# Patient Record
Sex: Male | Born: 1940 | Hispanic: No | Marital: Married | Smoking: Never smoker
Health system: Southern US, Community
[De-identification: ages and names within clinical notes are randomized; demographics above are authoritative.]

## PROBLEM LIST (undated history)

## (undated) DIAGNOSIS — I10 Essential (primary) hypertension: Secondary | ICD-10-CM

## (undated) DIAGNOSIS — D649 Anemia, unspecified: Secondary | ICD-10-CM

## (undated) DIAGNOSIS — J302 Other seasonal allergic rhinitis: Secondary | ICD-10-CM

## (undated) DIAGNOSIS — E785 Hyperlipidemia, unspecified: Secondary | ICD-10-CM

## (undated) DIAGNOSIS — J45909 Unspecified asthma, uncomplicated: Secondary | ICD-10-CM

## (undated) DIAGNOSIS — I4891 Unspecified atrial fibrillation: Secondary | ICD-10-CM

## (undated) DIAGNOSIS — K219 Gastro-esophageal reflux disease without esophagitis: Secondary | ICD-10-CM

## (undated) DIAGNOSIS — E119 Type 2 diabetes mellitus without complications: Secondary | ICD-10-CM

## (undated) DIAGNOSIS — N189 Chronic kidney disease, unspecified: Secondary | ICD-10-CM

## (undated) DIAGNOSIS — Z8679 Personal history of other diseases of the circulatory system: Secondary | ICD-10-CM

## (undated) HISTORY — DX: Anemia, unspecified: D64.9

## (undated) HISTORY — DX: Unspecified atrial fibrillation: I48.91

## (undated) HISTORY — PX: NO PAST SURGERIES: SHX2092

## (undated) HISTORY — PX: OTHER SURGICAL HISTORY: SHX169

## (undated) NOTE — *Deleted (*Deleted)
PMR Admission Coordinator Pre-Admission Assessment   Patient: Todd [name] is an 26 y.o., male MRN: [id number] DOB: February 03, 1941 Height: 6\' 1"  (185.4 cm) Weight: 103.7 kg                                                                                                                                                  Insurance Information HMO:     PPO:      PCP:      IPA:      80/20:      OTHER:  PRIMARY: [employer]      Policy#: [id number]      Subscriber: pt CM Name: ***      Phone#: [phone] ext ***     Fax#: [phone] Pre-Cert#: ***      Employer:  Benefits:  Phone #: [phone]     Name:  Irene Shipper. Date: ***     Deduct: ***      Out of Pocket Max: ***      Life Max: n/a  CIR: ***      SNF: *** Outpatient: ***     Co-Pay: *** Home Health: ***      Co-Pay: *** DME: ***     Co-Pay: *** Providers: *** SECONDARY: [facility]      Policy#: [id number]      Phone#:    Financial Counselor:       Phone#:    The "Data Collection Information Summary" for patients in Inpatient Rehabilitation Facilities with attached "Privacy Act [place] Records" was provided and verbally reviewed with: Patient and Family   Emergency Contact Information         Contact Information     Name Relation Home Work Mobile    [place] [facility] Spouse [phone]       Current Medical History  Patient Admitting Diagnosis: L medullary infarct   History of Present Illness: 45 y.o. 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  After d/c to home he had issues with dizziness and falls due to hypotension and noted to have HR in 170-200 on evaluation by EMS.  History taken from chart review and patient.  He was cardioverted X 2 and found to have concerns of blown right pupil.  He was seen in ED and PERRL but had cogwheeling in R>L extremities and RLE weakness.   CT head unremarkable for acute intracranial process.   Patient was not a candidate for tPA. MRI/MRA brain done?  Subtle diffusion abnormality involving ventral left medulla question subtle infarct.    CT chest showed diffuse infiltrate consistent with extensive left-sided pneumonia.  He was started on IV antibiotics.  Cardiology, Dr. Einar Gip felt that elevated cardiac enzymes related to CV/Afib. Metabolic acidosis treated with IV fluids and Coreg titrated upwards for rate control.  Therapy evaluation revealed ataxic gait with slurred speech. CIR recommended due to functional decline.    Complete NIHSS TOTAL: 0 Glasgow Coma Scale Score: 15   Past Medical History      Past Medical History:  Diagnosis Date  . Anemia    . Asthma      history of asthma  . Atrial fibrillation ([place])    . Chronic kidney disease    . Diabetes mellitus without complication ([place])      type 2 - no meds  . GERD (gastroesophageal reflux disease)    . HLD (hyperlipidemia)    . Hypertension    . Seasonal allergies    . TIA (transient ischemic attack) 06/15/2017      Family History  family history includes Colon cancer in his father; Diabetes in his father, mother, and sister; Stroke in his father.   Prior Rehab/Hospitalizations:  Has the patient had prior rehab or hospitalizations prior to admission? No   Has the patient had major surgery during 100 days prior to admission? Yes   Current Medications    Current Facility-Administered Medications:  .   stroke: mapping our early stages of recovery book, , Does not apply, Once, Posey Pronto, Poonam, MD .  albuterol (VENTOLIN HFA) 108 (90 Base) MCG/ACT inhaler 2 puff, 2 puff, Inhalation, Q6H PRN, Posey Pronto, Poonam, MD .  amoxicillin-clavulanate (AUGMENTIN) 500-125 MG per tablet 500 mg, 1 tablet, Oral, Q12H, Hensel, Jamal Collin, MD, 500 mg at 01/24/20 0911 .  apixaban (ELIQUIS) tablet 2.5 mg, 2.5 mg, Oral, BID, Dickie La, MD, 2.5 mg at 01/24/20 0858 .  atorvastatin (LIPITOR) tablet 80 mg, 80 mg, Oral, q1800, Lattie Haw, MD, 80 mg  at 01/23/20 1729 .  carvedilol (COREG) tablet 12.5 mg, 12.5 mg, Oral, BID WC, Ganta, Anupa, DO, 12.5 mg at 01/24/20 0858 .  ferrous sulfate tablet 325 mg, 325 mg, Oral, Daily, Lattie Haw, MD, 325 mg at 01/24/20 0858 .  fluticasone (FLONASE) 50 MCG/ACT nasal spray 2 spray, 2 spray, Each Nare, Daily PRN, Posey Pronto, Poonam, MD .  pantoprazole (PROTONIX) EC tablet 40 mg, 40 mg, Oral, Daily, Lattie Haw, MD, 40 mg at 01/24/20 0859 .  polyethylene glycol (MIRALAX / GLYCOLAX) packet 17 g, 17 g, Oral, Daily, Benay Pike, MD, 17 g at 01/24/20 1504 .  senna (SENOKOT) tablet 8.6 mg, 1 tablet, Oral, Daily, Benay Pike, MD, 8.6 mg at 01/24/20 1503   Patients Current Diet:     Diet Order                      Diet Heart Room service appropriate? Yes; Fluid consistency: Thin  Diet effective now                      Precautions / Restrictions Precautions Precautions: Fall Restrictions Weight Bearing Restrictions: No    Has the patient had 2 or more falls or a fall with injury in the past year?No   Prior Activity Level Limited Community (1-2x/wk): independent prior to admission, no DME except occasional cane outside, not driving   Prior Functional Level Prior Function Level of Independence: Independent, Independent with assistive device(s) Comments: pt reports use of cane intermittently when outdoors   Self Care: Did the patient need help bathing, dressing, using the toilet or eating?  Independent   Indoor Mobility: Did the patient need assistance with walking from room to room (with or without device)? Independent   Stairs: Did the patient need  assistance with internal or external stairs (with or without device)? Independent   Functional Cognition: Did the patient need help planning regular tasks such as shopping or remembering to take medications? Independent   Home Assistive Devices / Equipment Home Equipment: Environmental consultant - 2 wheels, [place] - single point, Grab bars - toilet, Grab  bars - tub/shower   Prior Device Use: Indicate devices/aids used by the patient prior to current illness, exacerbation or injury? None of the above   Current Functional Level Cognition   Arousal/Alertness: Awake/alert Overall Cognitive Status: No family/caregiver present to determine baseline cognitive functioning Orientation Level: Oriented to person, Oriented to place, Oriented to situation General Comments: slow processing and delayed response time Attention: Focused, Sustained Focused Attention: Appears intact Sustained Attention: Appears intact Memory: Impaired Memory Impairment: Retrieval deficit, Decreased recall of new information (Immediate: 3/3; delayed: 2/3; with cues: 1/1) Awareness: Appears intact Problem Solving: Appears intact Executive Function: Reasoning Reasoning: Impaired Reasoning Impairment: Verbal complex    Extremity Assessment (includes Sensation/Coordination)   Upper Extremity Assessment: RUE deficits/detail, LUE deficits/detail RUE Deficits / Details: AROM shoulder level to 90*, gross strength 4/5 but UE muscle groups a bit weaker when tested in isolation. Pt reports sensation intact. Decreased fine motor. Weak grip stength but able to hold onto rw. RUE Sensation: WNL RUE Coordination: decreased fine motor, decreased gross motor LUE Deficits / Details: generalized weakness.  LUE Sensation: WNL  Lower Extremity Assessment: Defer to PT evaluation     ADLs   Overall ADL's : Needs assistance/impaired Eating/Feeding: Set up, Sitting Grooming: Minimal assistance, Sitting Upper Body Bathing: Minimal assistance, Sitting Lower Body Bathing: Moderate assistance, Sit to/from stand Upper Body Dressing : Minimal assistance, Sitting Lower Body Dressing: Moderate assistance, Sit to/from stand Toilet Transfer: Minimal assistance, Ambulation, RW Toileting- Clothing Manipulation and Hygiene: Moderate assistance, Sit to/from stand Tub/ Shower Transfer: Tub transfer,  Moderate assistance, Ambulation, Rolling walker, 3 in 1 Functional mobility during ADLs: Minimal assistance, Rolling walker, Min guard General ADL Comments: Assist to powerup to standing and steady descent into sitting. Mostly min guard for functional mobility but light min A to steady on turns.      Mobility   Overal bed mobility: Needs Assistance Bed Mobility: Supine to Sit Supine to sit: Supervision, HOB elevated General bed mobility comments: extra time and effort. pt utilized bed rail and a bit of momentum     Transfers   Overall transfer level: Needs assistance Equipment used: Rolling walker (2 wheeled) Transfers: Sit to/from Stand Sit to Stand: Mod assist General transfer comment: PT providing cues for hand and foot placement as well as technique. Pt continues to require significant assistance to power up to standing due to LE weakness     Ambulation / Gait / Stairs / Wheelchair Mobility   Ambulation/Gait Ambulation/Gait assistance: Counsellor (Feet): 80 Feet Assistive device: Rolling walker (2 wheeled) Gait Pattern/deviations: Step-to pattern General Gait Details: pt with short step to gait, improved control of stance width without instances of scissoring. Pt does have poor R hand control and requires cues to maintain R hand on RW Gait velocity: reduced Gait velocity interpretation: <1.8 ft/sec, indicate of risk for recurrent falls     Posture / Balance Dynamic Sitting Balance Sitting balance - Comments: supervision Balance Overall balance assessment: Needs assistance Sitting-balance support: No upper extremity supported, Feet supported Sitting balance-Leahy Scale: Good Sitting balance - Comments: supervision Standing balance support: Single extremity supported, Bilateral upper extremity supported Standing balance-Leahy Scale: Poor Standing balance comment:  reliant on UE support of RW and minG     Special needs/care consideration Diabetic management yes and  Designated visitor [place] (from acute therapy documentation) Living Arrangements: Spouse/significant other  Lives With: Spouse Available Help at Discharge: Family, Available 24 hours/day Type of Home: House Home Layout: Two level Alternate Level Stairs-Rails: Right, Left (split rail, half left and half right) Alternate Level Stairs-Number of Steps: 13 Home Access: Stairs to enter Entrance Stairs-Rails: Can reach both Entrance Stairs-Number of Steps: 2 Bathroom Shower/Tub: Chiropodist: Standard   Discharge Living Setting Plans for Discharge Living Setting: Patient's home Type of Home at Discharge: House Discharge Home Layout: Two level, Bed/bath upstairs Alternate Level Stairs-Rails: Left (1/2 rail on R) Alternate Level Stairs-Number of Steps: 13 Discharge Home Access: Stairs to enter Entrance Stairs-Rails: Left, Right Entrance Stairs-Number of Steps: 2 Discharge Bathroom Shower/Tub: Tub/shower unit Discharge Bathroom Toilet: Standard Discharge Bathroom Accessibility: Yes How Accessible: Accessible via walker Does the patient have any problems obtaining your medications?: No   Social/Family/Support Systems Patient Roles: Spouse Anticipated Caregiver: Demeko Trabue (spouse) Anticipated Caregiver's Contact Information: [phone]; [phone] Ability/Limitations of Caregiver: supervision only Caregiver Availability: 24/7 Discharge Plan Discussed with Primary Caregiver: Yes Is Caregiver In Agreement with Plan?: Yes Does Caregiver/Family have Issues with Lodging/Transportation while Pt is in Rehab?: No     Goals Patient/Family Goal for Rehab: PT/OT supervision to mod I, SLP n/a Expected length of stay: 9-12 days Additional Information: pt also has VA benefits Pt/Family Agrees to Admission and willing to participate: Yes Program Orientation Provided & Reviewed with Pt/Caregiver Including Roles  &  Responsibilities: Yes Additional Information Needs: no     Decrease burden of Care through IP rehab admission: n/a     Possible need for SNF placement upon discharge:Not anticipated     Patient Condition: I have reviewed medical records from [facility], spoken with CM, and patient and spouse. I met with patient at the bedside for inpatient rehabilitation assessment.  Patient will benefit from ongoing PT, OT and SLP, can actively participate in 3 hours of therapy a day 5 days of the week, and can make measurable gains during the admission.  Patient will also benefit from the coordinated team approach during an Inpatient Acute Rehabilitation admission.  The patient will receive intensive therapy as well as Rehabilitation physician, nursing, social worker, and care management interventions.  Due to bladder management, bowel management, safety, skin/wound care, disease management, medication administration, pain management and patient education the patient requires 24 hour a day rehabilitation nursing.  The patient is currently min to mod assist with mobility and basic ADLs.  Discharge setting and therapy post discharge at home with home health is anticipated.  Patient has agreed to participate in the Acute Inpatient Rehabilitation Program and will admit pending insurance authorization ***.   Preadmission Screen Completed By:  Michel Santee, PT, DPT 01/24/2020 4:24 PM

---

## 2013-10-04 ENCOUNTER — Emergency Department (INDEPENDENT_AMBULATORY_CARE_PROVIDER_SITE_OTHER)
Admission: EM | Admit: 2013-10-04 | Discharge: 2013-10-04 | Disposition: A | Discharge status: Home / Self Care | Payer: Medicare Other | Source: Home / Self Care | Attending: Family Medicine | Admitting: Family Medicine

## 2013-10-04 ENCOUNTER — Encounter (HOSPITAL_COMMUNITY): Payer: Self-pay | Admitting: Emergency Medicine

## 2013-10-04 DIAGNOSIS — S61209A Unspecified open wound of unspecified finger without damage to nail, initial encounter: Secondary | ICD-10-CM | POA: Diagnosis not present

## 2013-10-04 DIAGNOSIS — W230XXA Caught, crushed, jammed, or pinched between moving objects, initial encounter: Secondary | ICD-10-CM

## 2013-10-04 DIAGNOSIS — S61213A Laceration without foreign body of left middle finger without damage to nail, initial encounter: Secondary | ICD-10-CM

## 2013-10-04 DIAGNOSIS — Z23 Encounter for immunization: Secondary | ICD-10-CM

## 2013-10-04 MED ORDER — TETANUS-DIPHTH-ACELL PERTUSSIS 5-2.5-18.5 LF-MCG/0.5 IM SUSP
0.5000 mL | Freq: Once | INTRAMUSCULAR | Status: AC
  Administered 2013-10-04: 0.5 mL via INTRAMUSCULAR

## 2013-10-04 MED ORDER — TETANUS-DIPHTH-ACELL PERTUSSIS 5-2.5-18.5 LF-MCG/0.5 IM SUSP
INTRAMUSCULAR | Status: AC
  Filled 2013-10-04: qty 0.5

## 2013-10-04 NOTE — ED Provider Notes (Signed)
Todd [name] is a 73 y.o. male who presents to Urgent Care today for laceration. Patient suffered a laceration to the left third digit at the DIP joint. The laceration is on the volar aspect. Laceration occurred when his finger was caught in a sliding door. He denies any weakness or numbness.. The wound at home. He feels well otherwise. He cannot recall his last tetanus vaccination.   History reviewed. No pertinent past medical history. History  Substance Use Topics  . Smoking status: Not on file  . Smokeless tobacco: Not on file  . Alcohol Use: Not on file   ROS as above Medications: No current facility-administered medications for this encounter.   No current outpatient prescriptions on file.    Exam:  BP 154/78  Pulse 61  Temp(Src) 98.1 F (36.7 C) (Oral)  Resp 18  SpO2 97% Gen: Well NAD ] Left third digit: 1-1/2 cm laceration at the PIP volar aspect. The laceration just for the dermis but not to deep structures. Patient is intact capillary refill sensation and flexion and extension. Pulses are intact at the wrist. No deformity noted.  Laceration repair:  Consent obtained and timeout performed. 4 mL of lidocaine without epinephrine were used to achieve a digital nerve block. 1 mL of lidocaine without epinephrine was injected around the wound achieving great anesthesia. The wound was copiously irrigated with sure cleanse solution, The wound was prepped and draped in the usual sterile fashion using Betadine. 3 horizontal mattress sutures using 4-0 Ethilon was used to close the wound. Patient tolerated the procedure well.  No results found for this or any previous visit (from the past 24 hour(s)). No results found.  Assessment and Plan: 73 y.o. male with laceration to left third digit. Repaired with sutures Tetanus vaccination provided. Followup in one week for suture removal.  Discussed warning signs or symptoms. Please see discharge instructions. Patient expresses  understanding.    Gregor Hams, MD 10/04/13 [phone]

## 2013-10-04 NOTE — Discharge Instructions (Signed)
Thank you for coming in today. Return in 1 week for suture removal.   Laceration Care, Adult A laceration is a cut or lesion that goes through all layers of the skin and into the tissue just beneath the skin. TREATMENT  Some lacerations may not require closure. Some lacerations may not be able to be closed due to an increased risk of infection. It is important to see your caregiver as soon as possible after an injury to minimize the risk of infection and maximize the opportunity for successful closure. If closure is appropriate, pain medicines may be given, if needed. The wound will be cleaned to help prevent infection. Your caregiver will use stitches (sutures), staples, wound glue (adhesive), or skin adhesive strips to repair the laceration. These tools bring the skin edges together to allow for faster healing and a better cosmetic outcome. However, all wounds will heal with a scar. Once the wound has healed, scarring can be minimized by covering the wound with sunscreen during the day for 1 full year. HOME CARE INSTRUCTIONS  For sutures or staples:  Keep the wound clean and dry.  If you were given a bandage (dressing), you should change it at least once a day. Also, change the dressing if it becomes wet or dirty, or as directed by your caregiver.  Wash the wound with soap and water 2 times a day. Rinse the wound off with water to remove all soap. Pat the wound dry with a clean towel.  After cleaning, apply a thin layer of the antibiotic ointment as recommended by your caregiver. This will help prevent infection and keep the dressing from sticking.  You may shower as usual after the first 24 hours. Do not soak the wound in water until the sutures are removed.  Only take over-the-counter or prescription medicines for pain, discomfort, or fever as directed by your caregiver.  Get your sutures or staples removed as directed by your caregiver. For skin adhesive strips:  Keep the wound clean  and dry.  Do not get the skin adhesive strips wet. You may bathe carefully, using caution to keep the wound dry.  If the wound gets wet, pat it dry with a clean towel.  Skin adhesive strips will fall off on their own. You may trim the strips as the wound heals. Do not remove skin adhesive strips that are still stuck to the wound. They will fall off in time. For wound adhesive:  You may briefly wet your wound in the shower or bath. Do not soak or scrub the wound. Do not swim. Avoid periods of heavy perspiration until the skin adhesive has fallen off on its own. After showering or bathing, gently pat the wound dry with a clean towel.  Do not apply liquid medicine, cream medicine, or ointment medicine to your wound while the skin adhesive is in place. This may loosen the film before your wound is healed.  If a dressing is placed over the wound, be careful not to apply tape directly over the skin adhesive. This may cause the adhesive to be pulled off before the wound is healed.  Avoid prolonged exposure to sunlight or tanning lamps while the skin adhesive is in place. Exposure to ultraviolet light in the first year will darken the scar.  The skin adhesive will usually remain in place for 5 to 10 days, then naturally fall off the skin. Do not pick at the adhesive film. You may need a tetanus shot if:  You cannot  remember when you had your last tetanus shot.  You have never had a tetanus shot. If you get a tetanus shot, your arm may swell, get red, and feel warm to the touch. This is common and not a problem. If you need a tetanus shot and you choose not to have one, there is a rare chance of getting tetanus. Sickness from tetanus can be serious. SEEK MEDICAL CARE IF:   You have redness, swelling, or increasing pain in the wound.  You see a red line that goes away from the wound.  You have yellowish-white fluid (pus) coming from the wound.  You have a fever.  You notice a bad smell coming  from the wound or dressing.  Your wound breaks open before or after sutures have been removed.  You notice something coming out of the wound such as wood or glass.  Your wound is on your hand or foot and you cannot move a finger or toe. SEEK IMMEDIATE MEDICAL CARE IF:   Your pain is not controlled with prescribed medicine.  You have severe swelling around the wound causing pain and numbness or a change in color in your arm, hand, leg, or foot.  Your wound splits open and starts bleeding.  You have worsening numbness, weakness, or loss of function of any joint around or beyond the wound.  You develop painful lumps near the wound or on the skin anywhere on your body. MAKE SURE YOU:   Understand these instructions.  Will watch your condition.  Will get help right away if you are not doing well or get worse. Document Released: 03/22/2005 Document Revised: 06/14/2011 Document Reviewed: 09/15/2010 [facility] Patient Information 2015 [place], [place]. This information is not intended to replace advice given to you by your health care provider. Make sure you discuss any questions you have with your health care provider.

## 2013-10-04 NOTE — ED Notes (Signed)
C/o left middle finger laceration due to closing hand in sliding door at home an hour ago

## 2013-10-18 ENCOUNTER — Encounter (HOSPITAL_COMMUNITY): Payer: Self-pay | Admitting: Emergency Medicine

## 2013-10-18 ENCOUNTER — Emergency Department (INDEPENDENT_AMBULATORY_CARE_PROVIDER_SITE_OTHER)
Admission: EM | Admit: 2013-10-18 | Discharge: 2013-10-18 | Disposition: A | Discharge status: Home / Self Care | Payer: Medicare Other | Source: Home / Self Care | Attending: Family Medicine | Admitting: Family Medicine

## 2013-10-18 DIAGNOSIS — Z4802 Encounter for removal of sutures: Secondary | ICD-10-CM | POA: Diagnosis not present

## 2013-10-18 DIAGNOSIS — S61219D Laceration without foreign body of unspecified finger without damage to nail, subsequent encounter: Secondary | ICD-10-CM

## 2013-10-18 NOTE — ED Provider Notes (Signed)
Todd [name] is a 73 y.o. male who presents to Urgent Care today for a lump. Patient was seen in July 2 for laceration of the volar left third DIP. He is here today for suture removal. He feels well with no pain or weakness or numbness.   No past medical history on file. History  Substance Use Topics  . Smoking status: Not on file  . Smokeless tobacco: Not on file  . Alcohol Use: Not on file   ROS as above Medications: No current facility-administered medications for this encounter.   No current outpatient prescriptions on file.    Exam:  BP 147/74  Pulse 75  Temp(Src) 98.9 F (37.2 C) (Oral)  Resp 16  SpO2 100% Gen: Well NAD Left third digit: Well-appearing wound and scar formation at the volar left third digit DIP. Flexion strength sensation and capillary refill are intact. Range of motion is slightly limited in flexion.  3 horizontal mattress sutures were removed.   No results found for this or any previous visit (from the past 24 hour(s)). No results found.  Assessment and Plan: 72 y.o. male with followup left third digit laceration with suture removal. Patient is doing well. Recommend home hand exercises. Additionally discussed scar minimization. If not improved followup with PCP for referral to hand physical therapy.  Discussed warning signs or symptoms. Please see discharge instructions. Patient expresses understanding.   This note was created using Systems analyst. Any transcription errors are unintended.    Gregor Hams, MD 10/18/13 1254

## 2013-10-18 NOTE — Discharge Instructions (Signed)
Thank you for coming in today. Work on the range of motion of that last joint and work on strength.  If not improving follow up with hand therapy at the [place].    Scar Minimization You will have a scar anytime you have surgery and a cut is made in the skin or you have something removed from your skin (mole, skin cancer, cyst). Although scars are unavoidable following surgery, there are ways to minimize their appearance. It is important to follow all the instructions you receive from your caregiver about wound care. How your wound heals will influence the appearance of your scar. If you do not follow the wound care instructions as directed, complications such as infection may occur. Wound instructions include keeping the wound clean, moist, and not letting the wound form a scab. Some people form scars that are raised and lumpy (hypertrophic) or larger than the initial wound (keloidal). HOME CARE INSTRUCTIONS   Follow wound care instructions as directed.  Keep the wound clean by washing it with soap and water.  Keep the wound moist with provided antibiotic cream or petroleum jelly until completely healed. Moisten twice a day for about 2 weeks.  Get stitches (sutures) taken out at the scheduled time.  Avoid touching or manipulating your wound unless needed. Wash your hands thoroughly before and after touching your wound.  Follow all restrictions such as limits on exercise or work. This depends on where your scar is located.  Keep the scar protected from sunburn. Cover the scar with sunscreen/sunblock with SPF 30 or higher.  Gently massage the scar using a circular motion to help minimize the appearance of the scar. Do this only after the wound has closed and all the sutures have been removed.  For hypertrophic or keloidal scars, there are several ways to treat and minimize their appearance. Methods include compression therapy, intralesional corticosteroids, laser therapy, or surgery. These  methods are performed by your caregiver. Remember that the scar may appear lighter or darker than your normal skin color. This difference in color should even out with time. SEEK MEDICAL CARE IF:   You have a fever.  You develop signs of infection such as pain, redness, pus, and warmth.  You have questions or concerns. Document Released: 09/09/2009 Document Revised: 06/14/2011 Document Reviewed: 09/09/2009 [facility] Patient Information 2015 [place], [place]. This information is not intended to replace advice given to you by your health care provider. Make sure you discuss any questions you have with your health care provider.

## 2013-10-18 NOTE — ED Notes (Signed)
Here suture removal

## 2014-10-05 ENCOUNTER — Emergency Department (HOSPITAL_COMMUNITY): Payer: Medicare Other

## 2014-10-05 ENCOUNTER — Encounter (HOSPITAL_COMMUNITY): Payer: Self-pay

## 2014-10-05 ENCOUNTER — Emergency Department (HOSPITAL_COMMUNITY)
Admission: EM | Admit: 2014-10-05 | Discharge: 2014-10-05 | Disposition: A | Discharge status: Home / Self Care | Payer: Medicare Other | Attending: Emergency Medicine | Admitting: Emergency Medicine

## 2014-10-05 DIAGNOSIS — Z79899 Other long term (current) drug therapy: Secondary | ICD-10-CM | POA: Diagnosis not present

## 2014-10-05 DIAGNOSIS — Z794 Long term (current) use of insulin: Secondary | ICD-10-CM | POA: Diagnosis not present

## 2014-10-05 DIAGNOSIS — Z7982 Long term (current) use of aspirin: Secondary | ICD-10-CM | POA: Insufficient documentation

## 2014-10-05 DIAGNOSIS — R102 Pelvic and perineal pain: Secondary | ICD-10-CM | POA: Diagnosis not present

## 2014-10-05 DIAGNOSIS — R1032 Left lower quadrant pain: Secondary | ICD-10-CM | POA: Diagnosis present

## 2014-10-05 DIAGNOSIS — R109 Unspecified abdominal pain: Secondary | ICD-10-CM | POA: Diagnosis not present

## 2014-10-05 LAB — CBC WITH DIFFERENTIAL/PLATELET
BASOS ABS: 0 10*3/uL (ref 0.0–0.1)
BASOS PCT: 0 % (ref 0–1)
Eosinophils Absolute: 0.2 10*3/uL (ref 0.0–0.7)
Eosinophils Relative: 3 % (ref 0–5)
HEMATOCRIT: 38.2 % — AB (ref 39.0–52.0)
HEMOGLOBIN: 12.5 g/dL — AB (ref 13.0–17.0)
Lymphocytes Relative: 34 % (ref 12–46)
Lymphs Abs: 2 10*3/uL (ref 0.7–4.0)
MCH: 26.7 pg (ref 26.0–34.0)
MCHC: 32.7 g/dL (ref 30.0–36.0)
MCV: 81.6 fL (ref 78.0–100.0)
Monocytes Absolute: 0.5 10*3/uL (ref 0.1–1.0)
Monocytes Relative: 8 % (ref 3–12)
Neutro Abs: 3.2 10*3/uL (ref 1.7–7.7)
Neutrophils Relative %: 55 % (ref 43–77)
PLATELETS: 236 10*3/uL (ref 150–400)
RBC: 4.68 MIL/uL (ref 4.22–5.81)
RDW: 12.8 % (ref 11.5–15.5)
WBC: 5.8 10*3/uL (ref 4.0–10.5)

## 2014-10-05 LAB — COMPREHENSIVE METABOLIC PANEL
ALBUMIN: 4.1 g/dL (ref 3.5–5.0)
ALK PHOS: 49 U/L (ref 38–126)
ALT: 18 U/L (ref 17–63)
AST: 20 U/L (ref 15–41)
Anion gap: 11 (ref 5–15)
BILIRUBIN TOTAL: 0.5 mg/dL (ref 0.3–1.2)
BUN: 32 mg/dL — AB (ref 6–20)
CALCIUM: 8.9 mg/dL (ref 8.9–10.3)
CHLORIDE: 104 mmol/L (ref 101–111)
CO2: 26 mmol/L (ref 22–32)
CREATININE: 1.78 mg/dL — AB (ref 0.61–1.24)
GFR, EST AFRICAN AMERICAN: 41 mL/min — AB (ref >60)
GFR, EST NON AFRICAN AMERICAN: 35 mL/min — AB (ref >60)
Glucose, Bld: 183 mg/dL — ABNORMAL HIGH (ref 65–99)
POTASSIUM: 3.3 mmol/L — AB (ref 3.5–5.1)
SODIUM: 141 mmol/L (ref 135–145)
TOTAL PROTEIN: 7.5 g/dL (ref 6.5–8.1)

## 2014-10-05 LAB — LIPASE, BLOOD: Lipase: 53 U/L — ABNORMAL HIGH (ref 22–51)

## 2014-10-05 LAB — URINALYSIS, ROUTINE W REFLEX MICROSCOPIC
Bilirubin Urine: NEGATIVE
Glucose, UA: NEGATIVE mg/dL
HGB URINE DIPSTICK: NEGATIVE
Ketones, ur: NEGATIVE mg/dL
LEUKOCYTES UA: NEGATIVE
Nitrite: NEGATIVE
Protein, ur: NEGATIVE mg/dL
Specific Gravity, Urine: 1.01 (ref 1.005–1.030)
UROBILINOGEN UA: 0.2 mg/dL (ref 0.0–1.0)
pH: 5 (ref 5.0–8.0)

## 2014-10-05 MED ORDER — ONDANSETRON HCL 4 MG PO TABS: 4.0000 mg | ORAL_TABLET | Freq: Four times a day (QID) | ORAL | Status: DC

## 2014-10-05 NOTE — ED Notes (Signed)
US at bedside

## 2014-10-05 NOTE — ED Notes (Signed)
Pt being interviewed by EDP at present: Pt c/o abdominal pain x 2 months that comes and goes.  C/O constipation and diarrhea and pain worsened today.  Denies n/v, fevers.

## 2014-10-05 NOTE — Discharge Instructions (Signed)
The cause of your abdominal pain was not identified today.  Please follow up with your family doctor to review [facility].  You have kidney disease with a creatinine of 1.78 today, which is similar to your prior labs.  Get rechecked immediately if you develop any new or worrisome symptoms.    Abdominal Pain Many things can cause abdominal pain. Usually, abdominal pain is not caused by a disease and will improve without treatment. It can often be observed and treated at home. Your health care provider will do a physical exam and possibly order blood tests and X-rays to help determine the seriousness of your pain. However, in many cases, more time must pass before a clear cause of the pain can be found. Before that point, your health care provider may not know if you need more testing or further treatment. HOME CARE INSTRUCTIONS  Monitor your abdominal pain for any changes. The following actions may help to alleviate any discomfort you are experiencing:  Only take over-the-counter or prescription medicines as directed by your health care provider.  Do not take laxatives unless directed to do so by your health care provider.  Try a clear liquid diet (broth, tea, or water) as directed by your health care provider. Slowly move to a bland diet as tolerated. SEEK MEDICAL CARE IF:  You have unexplained abdominal pain.  You have abdominal pain associated with nausea or diarrhea.  You have pain when you urinate or have a bowel movement.  You experience abdominal pain that wakes you in the night.  You have abdominal pain that is worsened or improved by eating food.  You have abdominal pain that is worsened with eating fatty foods.  You have a fever. SEEK IMMEDIATE MEDICAL CARE IF:   Your pain does not go away within 2 hours.  You keep throwing up (vomiting).  Your pain is felt only in portions of the abdomen, such as the right side or the left lower portion of the abdomen.  You  pass bloody or black tarry stools. MAKE SURE YOU:  Understand these instructions.   Will watch your condition.   Will get help right away if you are not doing well or get worse.  Document Released: 12/30/2004 Document Revised: 03/27/2013 Document Reviewed: 11/29/2012 [facility] Patient Information 2015 [place], [place]. This information is not intended to replace advice given to you by your health care provider. Make sure you discuss any questions you have with your health care provider.

## 2014-10-05 NOTE — ED Provider Notes (Signed)
CSN: [id number]     Arrival date & time 10/05/14  1426 History   First MD Initiated Contact with Patient 10/05/14 1457     Chief Complaint  Patient presents with  . Abdominal Pain     Patient is a 74 y.o. male presenting with abdominal pain. The history is provided by the patient and the spouse. No language interpreter was used.  Abdominal Pain  Todd [name] is for evaluation of abdominal pain. He reports 2 months of waxing and waning abdominal pain. The pain is located in the central abdominal region and left lower quadrant. Pain is worse with medications and meals. It is waxing and waning in nature. He has intermittent constipation and diarrhea, currently his bowel movements are regular. He presents today because the pain was much worse earlier prior to ED arrival. Denies any fevers, vomiting. He recently had an outpatient CT scan at the [place] 2 days ago for evaluation of this abdominal pain after receiving a letter saying that he may have pancreatitis.  No past medical history on file. No past surgical history on file. No family history on file. History  Substance Use Topics  . Smoking status: Not on file  . Smokeless tobacco: Not on file  . Alcohol Use: Not on file    Review of Systems  Gastrointestinal: Positive for abdominal pain.  All other systems reviewed and are negative.     Allergies  Lisinopril and Metformin and related  Home Medications   Prior to Admission medications   Medication Sig Start Date End Date Taking? Authorizing Provider  amLODipine (NORVASC) 10 MG tablet Take 10 mg by mouth daily.   Yes Historical Provider, MD  aspirin 325 MG tablet Take 325 mg by mouth daily.   Yes Historical Provider, MD  furosemide (LASIX) 40 MG tablet Take 60 mg by mouth 2 (two) times daily.   Yes Historical Provider, MD  glipiZIDE (GLUCOTROL) 10 MG tablet Take 20 mg by mouth 2 (two) times daily before a meal.   Yes Historical Provider, MD  hydrALAZINE (APRESOLINE) 50 MG tablet Take  50 mg by mouth 2 (two) times daily.   Yes Historical Provider, MD  insulin NPH Human (HUMULIN N,NOVOLIN N) 100 UNIT/ML injection Inject 10 Units into the skin every evening.   Yes Historical Provider, MD  lactulose (CHRONULAC) 10 GM/15ML solution Take 10 g by mouth daily as needed for mild constipation or moderate constipation.   Yes Historical Provider, MD  losartan (COZAAR) 100 MG tablet Take 100 mg by mouth daily.   Yes Historical Provider, MD  metoprolol (LOPRESSOR) 50 MG tablet Take 50 mg by mouth 2 (two) times daily.   Yes Historical Provider, MD  omeprazole (PRILOSEC) 20 MG capsule Take 1 capsule by mouth daily. 09/24/14  Yes Historical Provider, MD  sennosides-docusate sodium (SENOKOT-S) 8.6-50 MG tablet Take 1 tablet by mouth 2 (two) times daily as needed for constipation.   Yes Historical Provider, MD  Sodium Phosphates (RA SALINE ENEMA RE) Place 1 application rectally as needed (take every other day as needed for constipation).   Yes Historical Provider, MD   BP 153/67 mmHg  Pulse 68  Temp(Src) 98.5 F (36.9 C) (Oral)  Resp 18  SpO2 98% Physical Exam  Constitutional: He is oriented to person, place, and time. He appears well-developed and well-nourished.  HENT:  Head: Normocephalic and atraumatic.  Cardiovascular: Normal rate and regular rhythm.   No murmur heard. Pulmonary/Chest: Effort normal and breath sounds normal. No respiratory distress.  Abdominal: Soft. There is no rebound and no guarding.  Mild central abdominal tenderness  Musculoskeletal: He exhibits no edema or tenderness.  Neurological: He is alert and oriented to person, place, and time.  Skin: Skin is warm and dry.  Psychiatric: He has a normal mood and affect. His behavior is normal.  Nursing note and vitals reviewed.   ED Course  Procedures (including critical care time) Labs Review Labs Reviewed  COMPREHENSIVE METABOLIC PANEL - Abnormal; Notable for the following:    Potassium 3.3 (*)    Glucose, Bld  183 (*)    BUN 32 (*)    Creatinine, Ser 1.78 (*)    GFR calc non Af Amer 35 (*)    GFR calc Af Amer 41 (*)    All other components within normal limits  CBC WITH DIFFERENTIAL/PLATELET - Abnormal; Notable for the following:    Hemoglobin 12.5 (*)    HCT 38.2 (*)    All other components within normal limits  LIPASE, BLOOD - Abnormal; Notable for the following:    Lipase 53 (*)    All other components within normal limits  URINALYSIS, ROUTINE W REFLEX MICROSCOPIC (NOT AT [facility])    Imaging Review US Abdomen Limited Ruq  10/05/2014   CLINICAL DATA:  Abdomen pain for 2 months  EXAM: US ABDOMEN LIMITED - RIGHT UPPER QUADRANT  COMPARISON:  None.  FINDINGS: Gallbladder:  No gallstones or wall thickening visualized. No sonographic Murphy sign noted.  Common bile duct:  Diameter: 4.3 mm  Liver:  No focal lesion identified. Within normal limits in parenchymal echogenicity.  IMPRESSION: Normal right upper quadrant ultrasound.   Electronically Signed   By: Abelardo Diesel M.D.   On: 10/05/2014 19:15     EKG Interpretation   Date/Time:  Saturday October 05 2014 15:51:53 EDT Ventricular Rate:  56 PR Interval:  146 QRS Duration: 101 QT Interval:  420 QTC Calculation: 405 R Axis:   45 Text Interpretation:  Sinus rhythm Atrial premature complexes Baseline  wander in lead(s) V6 Confirmed by Hazle Coca [phone]) on 10/05/2014 3:56:30 PM      MDM   Final diagnoses:  Abdominal pain    Patient with history of renal insufficiency here for evaluation of gradually progressive abdominal pain, worse with meals. On initial examination patient had some central abdominal tenderness. Repeat evaluation patient had no abdominal tenderness. Patient recently had an outpatient CT scan performed 2 days ago and we are unable to obtain the results in the emergency department at this time. Patient is on aware of what the results were. Given patient's pain is currently resolved do not feel additional CT scan is warranted. Plan  to DC home with close outpatient follow-up. Discussed return precautions for abdominal pain. Presentation is not consistent with appendicitis, cholecystitis.    Quintella Reichert, MD 10/05/14 2328

## 2014-10-08 DIAGNOSIS — E1121 Type 2 diabetes mellitus with diabetic nephropathy: Secondary | ICD-10-CM | POA: Diagnosis not present

## 2014-10-08 DIAGNOSIS — N183 Chronic kidney disease, stage 3 (moderate): Secondary | ICD-10-CM | POA: Diagnosis not present

## 2014-10-08 DIAGNOSIS — E785 Hyperlipidemia, unspecified: Secondary | ICD-10-CM | POA: Diagnosis not present

## 2014-10-08 DIAGNOSIS — I129 Hypertensive chronic kidney disease with stage 1 through stage 4 chronic kidney disease, or unspecified chronic kidney disease: Secondary | ICD-10-CM | POA: Diagnosis not present

## 2014-10-08 DIAGNOSIS — E1165 Type 2 diabetes mellitus with hyperglycemia: Secondary | ICD-10-CM | POA: Diagnosis not present

## 2014-10-08 DIAGNOSIS — R109 Unspecified abdominal pain: Secondary | ICD-10-CM | POA: Diagnosis not present

## 2014-10-15 DIAGNOSIS — R1084 Generalized abdominal pain: Secondary | ICD-10-CM | POA: Diagnosis not present

## 2014-10-15 DIAGNOSIS — R110 Nausea: Secondary | ICD-10-CM | POA: Diagnosis not present

## 2014-10-15 DIAGNOSIS — R634 Abnormal weight loss: Secondary | ICD-10-CM | POA: Diagnosis not present

## 2014-10-16 ENCOUNTER — Other Ambulatory Visit: Payer: Self-pay | Admitting: Gastroenterology

## 2014-10-16 NOTE — Addendum Note (Signed)
Addended by: Wilford Corner on: 10/16/2014 11:54 AM   Modules accepted: Orders

## 2014-10-17 ENCOUNTER — Ambulatory Visit (HOSPITAL_COMMUNITY)
Admission: RE | Admit: 2014-10-17 | Discharge: 2014-10-17 | Disposition: A | Discharge status: Home / Self Care | Payer: Medicare Other | Source: Ambulatory Visit | Attending: Gastroenterology | Admitting: Gastroenterology

## 2014-10-17 ENCOUNTER — Encounter (HOSPITAL_COMMUNITY): Payer: Self-pay

## 2014-10-17 ENCOUNTER — Encounter (HOSPITAL_COMMUNITY)
Admission: RE | Disposition: A | Discharge status: Home / Self Care | Payer: Self-pay | Source: Ambulatory Visit | Attending: Gastroenterology

## 2014-10-17 DIAGNOSIS — R634 Abnormal weight loss: Secondary | ICD-10-CM | POA: Insufficient documentation

## 2014-10-17 DIAGNOSIS — K317 Polyp of stomach and duodenum: Secondary | ICD-10-CM | POA: Insufficient documentation

## 2014-10-17 DIAGNOSIS — R109 Unspecified abdominal pain: Secondary | ICD-10-CM | POA: Insufficient documentation

## 2014-10-17 DIAGNOSIS — K2970 Gastritis, unspecified, without bleeding: Secondary | ICD-10-CM | POA: Diagnosis not present

## 2014-10-17 DIAGNOSIS — K2950 Unspecified chronic gastritis without bleeding: Secondary | ICD-10-CM | POA: Diagnosis not present

## 2014-10-17 DIAGNOSIS — R1084 Generalized abdominal pain: Secondary | ICD-10-CM | POA: Diagnosis present

## 2014-10-17 DIAGNOSIS — K2960 Other gastritis without bleeding: Secondary | ICD-10-CM | POA: Diagnosis not present

## 2014-10-17 HISTORY — PX: ESOPHAGOGASTRODUODENOSCOPY: SHX5428

## 2014-10-17 HISTORY — DX: Type 2 diabetes mellitus without complications: E11.9

## 2014-10-17 HISTORY — DX: Gastro-esophageal reflux disease without esophagitis: K21.9

## 2014-10-17 HISTORY — DX: Essential (primary) hypertension: I10

## 2014-10-17 HISTORY — DX: Unspecified asthma, uncomplicated: J45.909

## 2014-10-17 HISTORY — DX: Chronic kidney disease, unspecified: N18.9

## 2014-10-17 LAB — GLUCOSE, CAPILLARY: Glucose-Capillary: 133 mg/dL — ABNORMAL HIGH (ref 65–99)

## 2014-10-17 SURGERY — EGD (ESOPHAGOGASTRODUODENOSCOPY)
Anesthesia: Moderate Sedation

## 2014-10-17 MED ORDER — BUTAMBEN-TETRACAINE-BENZOCAINE 2-2-14 % EX AERO
INHALATION_SPRAY | CUTANEOUS | Status: DC | PRN
  Administered 2014-10-17: 2 via TOPICAL

## 2014-10-17 MED ORDER — MIDAZOLAM HCL 10 MG/2ML IJ SOLN
INTRAMUSCULAR | Status: DC | PRN
  Administered 2014-10-17: 2 mg via INTRAVENOUS
  Administered 2014-10-17: 1 mg via INTRAVENOUS
  Administered 2014-10-17: 2 mg via INTRAVENOUS

## 2014-10-17 MED ORDER — FENTANYL CITRATE (PF) 100 MCG/2ML IJ SOLN
INTRAMUSCULAR | Status: DC | PRN
  Administered 2014-10-17 (×2): 25 ug via INTRAVENOUS

## 2014-10-17 MED ORDER — MIDAZOLAM HCL 5 MG/ML IJ SOLN
INTRAMUSCULAR | Status: AC
  Filled 2014-10-17: qty 2

## 2014-10-17 MED ORDER — SODIUM CHLORIDE 0.9 % IV SOLN: INTRAVENOUS | Status: DC

## 2014-10-17 MED ORDER — FENTANYL CITRATE (PF) 100 MCG/2ML IJ SOLN
INTRAMUSCULAR | Status: AC
  Filled 2014-10-17: qty 2

## 2014-10-17 NOTE — Op Note (Signed)
[facility] [place], [id number]   ENDOSCOPY PROCEDURE REPORT  PATIENT: Todd, [name]  MR#: [id number] BIRTHDATE: [birth year] , 35  yrs. old GENDER: male ENDOSCOPIST: Wilford Corner, MD REFERRED BY: PROCEDURE DATE:  10/19/2014 PROCEDURE:  EGD w/ biopsy ASA CLASS:     Class III INDICATIONS:  abdominal pain and weight loss. MEDICATIONS: Fentanyl 50 mcg IV and Versed 5 mg IV TOPICAL ANESTHETIC: Cetacaine Spray  DESCRIPTION OF PROCEDURE: After the risks benefits and alternatives of the procedure were thoroughly explained, informed consent was obtained.  The    endoscope was introduced through the mouth and advanced to the second portion of the duodenum , Without limitations.  The instrument was slowly withdrawn as the mucosa was fully examined. Estimated blood loss is zero unless otherwise noted in this procedure report.    Esophagus and GEJ normal. GEJ 45 cm from the incisors. Focal area of erythema and edema in the distal stomach - s/p biopsies. A medium-sized polyp in the distal duodenal bulb s/p biopsies with normal surrounding mucosa.      Retroflexed views revealed no abnormalities.     The scope was then withdrawn from the patient and the procedure completed.  COMPLICATIONS: There were no immediate complications.  ENDOSCOPIC IMPRESSION:     Duodenal polyp - s/p biopsies Antral gastritis - s/p biopsies   RECOMMENDATIONS:     F/U on path and if biopsies unrevealing for a source of abdominal pain then will need a gastric emptying scan   eSigned:  Wilford Corner, MD October 19, 2014 8:59 AM    CC:  CPT CODES: ICD CODES:  The ICD and CPT codes recommended by this software are interpretations from the data that the clinical staff has captured with the software.  The verification of the translation of this report to the ICD and CPT codes and modifiers is the sole responsibility of the health care institution and  practicing physician where this report was generated.  [place]. will not be held responsible for the validity of the ICD and CPT codes included on this report.  AMA assumes no liability for data contained or not contained herein. CPT is a Designer, television/film set of the [employer].  PATIENT NAME:  Todd, [name] MR#: [id number]

## 2014-10-17 NOTE — Interval H&P Note (Signed)
History and Physical Interval Note:  10/17/2014 8:22 AM  Todd [name]  has presented today for surgery, with the diagnosis of generalized abdomen pain  The various methods of treatment have been discussed with the patient and family. After consideration of risks, benefits and other options for treatment, the patient has consented to  Procedure(s): ESOPHAGOGASTRODUODENOSCOPY (EGD) (N/A) as a surgical intervention .  The patient's history has been reviewed, patient examined, no change in status, stable for surgery.  I have reviewed the patient's chart and labs.  Questions were answered to the patient's satisfaction.     [place] C.

## 2014-10-17 NOTE — H&P (Signed)
  Date of Initial H&P: 10/15/14  History reviewed, patient examined, no change in status, stable for surgery.

## 2014-10-17 NOTE — Discharge Instructions (Addendum)
Will call you when the pathology results are complete.  Gastritis, Adult Gastritis is soreness and puffiness (inflammation) of the lining of the stomach. If you do not get help, gastritis can cause bleeding and sores (ulcers) in the stomach. HOME CARE   Only take medicine as told by your doctor.  If you were given antibiotic medicines, take them as told. Finish the medicines even if you start to feel better.  Drink enough fluids to keep your pee (urine) clear or pale yellow.  Avoid foods and drinks that make your problems worse. Foods you may want to avoid include:  Caffeine or alcohol.  Chocolate.  Mint.  Garlic and onions.  Spicy foods.  Citrus fruits, including oranges, lemons, or limes.  Food containing tomatoes, including sauce, chili, salsa, and pizza.  Fried and fatty foods.  Eat small meals throughout the day instead of large meals. GET HELP RIGHT AWAY IF:   You have black or dark red poop (stools).  You throw up (vomit) blood. It may look like coffee grounds.  You cannot keep fluids down.  Your belly (abdominal) pain gets worse.  You have a fever.  You do not feel better after 1 week.  You have any other questions or concerns. MAKE SURE YOU:   Understand these instructions.  Will watch your condition.  Will get help right away if you are not doing well or get worse. Document Released: 09/08/2007 Document Revised: 06/14/2011 Document Reviewed: 05/05/2011 [facility] Patient Information 2015 [place], [place]. This information is not intended to replace advice given to you by your health care provider. Make sure you discuss any questions you have with your health care provider. Esophagogastroduodenoscopy Care After Refer to this sheet in the next few weeks. These instructions provide you with information on caring for yourself after your procedure. Your caregiver may also give you more specific instructions. Your treatment has been planned according to  current medical practices, but problems sometimes occur. Call your caregiver if you have any problems or questions after your procedure.  HOME CARE INSTRUCTIONS  Do not eat or drink anything until the numbing medicine (local anesthetic) has worn off and your gag reflex has returned. You will know that the local anesthetic has worn off when you can swallow comfortably.  Do not drive for 12 hours after the procedure or as directed by your caregiver.  Only take medicines as directed by your caregiver. SEEK MEDICAL CARE IF:   You cannot stop coughing.  You are not urinating at all or less than usual. SEEK IMMEDIATE MEDICAL CARE IF:  You have difficulty swallowing.  You cannot eat or drink.  You have worsening throat or chest pain.  You have dizziness, lightheadedness, or you faint.  You have nausea or vomiting.  You have chills.  You have a fever.  You have severe abdominal pain.  You have black, tarry, or bloody stools. Document Released: 03/08/2012 Document Reviewed: 03/08/2012 [facility] Patient Information 2015 [place]. This information is not intended to replace advice given to you by your health care provider. Make sure you discuss any questions you have with your health care provider.

## 2014-10-18 ENCOUNTER — Encounter (HOSPITAL_COMMUNITY): Payer: Self-pay | Admitting: Gastroenterology

## 2014-10-22 ENCOUNTER — Other Ambulatory Visit (HOSPITAL_COMMUNITY): Payer: Self-pay | Admitting: Gastroenterology

## 2014-10-22 DIAGNOSIS — R1084 Generalized abdominal pain: Secondary | ICD-10-CM

## 2014-10-22 DIAGNOSIS — R110 Nausea: Secondary | ICD-10-CM

## 2014-11-01 ENCOUNTER — Ambulatory Visit (HOSPITAL_COMMUNITY)
Admission: RE | Admit: 2014-11-01 | Discharge: 2014-11-01 | Disposition: A | Discharge status: Home / Self Care | Payer: Medicare Other | Source: Ambulatory Visit | Attending: Gastroenterology | Admitting: Gastroenterology

## 2014-11-01 DIAGNOSIS — R110 Nausea: Secondary | ICD-10-CM | POA: Diagnosis not present

## 2014-11-01 DIAGNOSIS — R1084 Generalized abdominal pain: Secondary | ICD-10-CM | POA: Diagnosis not present

## 2014-11-01 MED ORDER — TECHNETIUM TC 99M SULFUR COLLOID
2.1000 | Freq: Once | INTRAVENOUS | Status: AC | PRN
  Administered 2014-11-01: 2.1 via INTRAVENOUS

## 2014-11-08 DIAGNOSIS — R1084 Generalized abdominal pain: Secondary | ICD-10-CM | POA: Diagnosis not present

## 2014-11-08 DIAGNOSIS — R110 Nausea: Secondary | ICD-10-CM | POA: Diagnosis not present

## 2014-11-11 ENCOUNTER — Other Ambulatory Visit (HOSPITAL_COMMUNITY): Payer: Self-pay | Admitting: Gastroenterology

## 2014-11-11 DIAGNOSIS — R1084 Generalized abdominal pain: Secondary | ICD-10-CM

## 2014-11-25 ENCOUNTER — Ambulatory Visit (HOSPITAL_COMMUNITY)
Admission: RE | Admit: 2014-11-25 | Discharge: 2014-11-25 | Disposition: A | Discharge status: Home / Self Care | Payer: Medicare Other | Source: Ambulatory Visit | Attending: Gastroenterology | Admitting: Gastroenterology

## 2014-11-25 DIAGNOSIS — R109 Unspecified abdominal pain: Secondary | ICD-10-CM | POA: Diagnosis not present

## 2014-11-25 DIAGNOSIS — R1084 Generalized abdominal pain: Secondary | ICD-10-CM | POA: Diagnosis not present

## 2014-11-25 MED ORDER — SINCALIDE 5 MCG IJ SOLR
INTRAMUSCULAR | Status: AC
  Administered 2014-11-25: 2.1 ug via INTRAVENOUS
  Filled 2014-11-25: qty 10

## 2014-11-25 MED ORDER — STERILE WATER FOR INJECTION IJ SOLN
INTRAMUSCULAR | Status: AC
  Administered 2014-11-25: 10 mL
  Filled 2014-11-25: qty 10

## 2014-11-25 MED ORDER — SINCALIDE 5 MCG IJ SOLR
0.0200 ug/kg | Freq: Once | INTRAMUSCULAR | Status: AC
  Administered 2014-11-25: 2.1 ug via INTRAVENOUS

## 2014-11-25 MED ORDER — TECHNETIUM TC 99M MEBROFENIN IV KIT: 5.0000 | PACK | Freq: Once | INTRAVENOUS | Status: DC | PRN

## 2014-12-25 DIAGNOSIS — J4520 Mild intermittent asthma, uncomplicated: Secondary | ICD-10-CM | POA: Diagnosis not present

## 2014-12-25 DIAGNOSIS — R1013 Epigastric pain: Secondary | ICD-10-CM | POA: Diagnosis not present

## 2014-12-25 DIAGNOSIS — J309 Allergic rhinitis, unspecified: Secondary | ICD-10-CM | POA: Diagnosis not present

## 2014-12-30 DIAGNOSIS — R1084 Generalized abdominal pain: Secondary | ICD-10-CM | POA: Diagnosis not present

## 2015-06-09 DIAGNOSIS — R140 Abdominal distension (gaseous): Secondary | ICD-10-CM | POA: Diagnosis not present

## 2015-06-09 DIAGNOSIS — R1084 Generalized abdominal pain: Secondary | ICD-10-CM | POA: Diagnosis not present

## 2015-09-21 IMAGING — NM NM HEPATO W/GB/PHARM/[PERSON_NAME]
2 series · 7 of 7 positions shown · non-contrast
Comparison: None.

CLINICAL DATA: Abdominal pain.

EXAM:
NUCLEAR MEDICINE HEPATOBILIARY IMAGING WITH GALLBLADDER EF
TECHNIQUE: Sequential images of the abdomen were obtained [DATE] minutes
following intravenous administration of radiopharmaceutical. After
slow intravenous infusion of 2.1 micrograms Cholecystokinin,
gallbladder ejection fraction was determined.
RADIOPHARMACEUTICALS:  5.0 mCi Wc-BBm Choletec IV

[he hepatobiliary · 3.43mm/px · 6 of 60 frames shown (1 of 2)]
[frame 6/60]
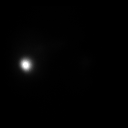
[frame 16/60]
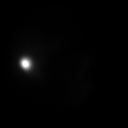
[frame 26/60]
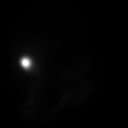
[frame 36/60]
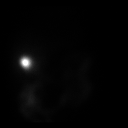
[frame 46/60]
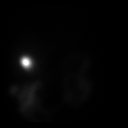
[frame 56/60]
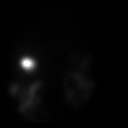

[he hepatobiliary · 1 of 1 slices shown (2 of 2)]
[im 1/1]
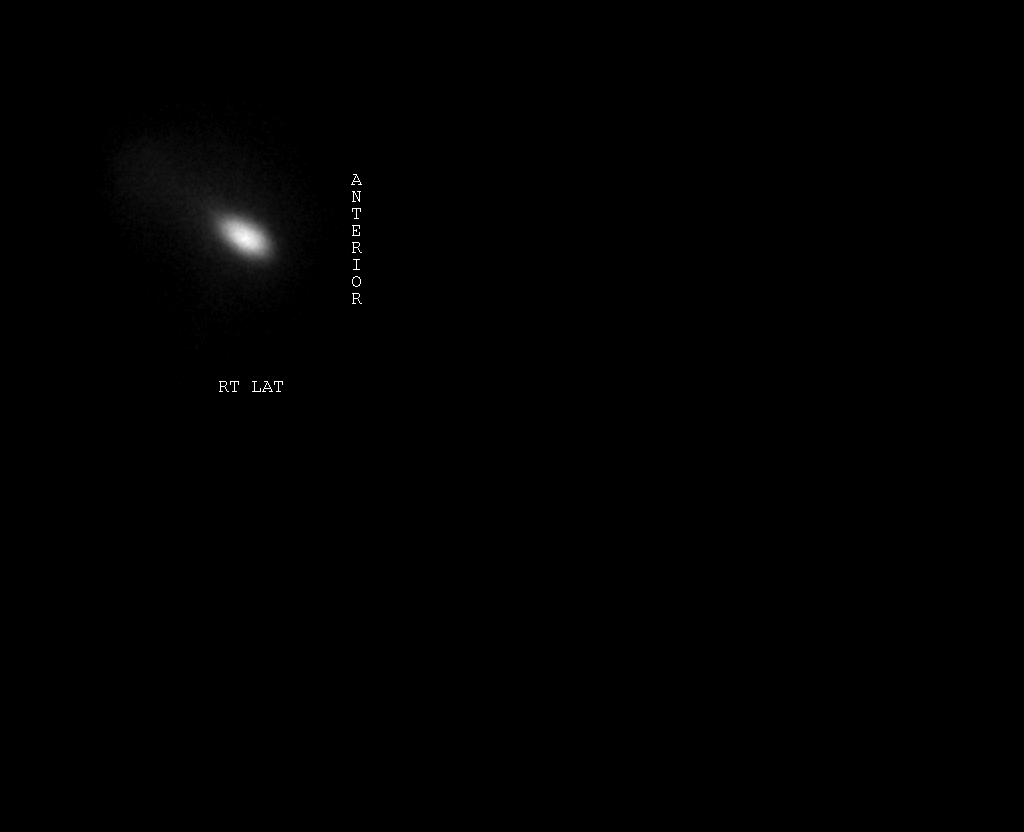

[7 of 7 positions shown; findings below may reference images not displayed]

FINDINGS: Liver, biliary system, gallbladder, bowel visualize normally.
Gallbladder ejection fraction at 45 min is 52.8%. At 45 min, normal
ejection fraction is greater than 40%.
IMPRESSION: Normal exam

## 2016-03-30 DIAGNOSIS — M1990 Unspecified osteoarthritis, unspecified site: Secondary | ICD-10-CM | POA: Diagnosis not present

## 2016-03-30 DIAGNOSIS — R600 Localized edema: Secondary | ICD-10-CM | POA: Diagnosis not present

## 2016-03-30 DIAGNOSIS — N289 Disorder of kidney and ureter, unspecified: Secondary | ICD-10-CM | POA: Diagnosis not present

## 2016-03-30 DIAGNOSIS — I509 Heart failure, unspecified: Secondary | ICD-10-CM | POA: Diagnosis not present

## 2016-03-30 DIAGNOSIS — I110 Hypertensive heart disease with heart failure: Secondary | ICD-10-CM | POA: Diagnosis not present

## 2016-03-30 DIAGNOSIS — I10 Essential (primary) hypertension: Secondary | ICD-10-CM | POA: Diagnosis not present

## 2016-03-30 DIAGNOSIS — K219 Gastro-esophageal reflux disease without esophagitis: Secondary | ICD-10-CM | POA: Diagnosis not present

## 2016-03-30 DIAGNOSIS — E119 Type 2 diabetes mellitus without complications: Secondary | ICD-10-CM | POA: Diagnosis not present

## 2016-03-30 DIAGNOSIS — J810 Acute pulmonary edema: Secondary | ICD-10-CM | POA: Diagnosis not present

## 2017-06-15 ENCOUNTER — Other Ambulatory Visit: Payer: Self-pay

## 2017-06-15 ENCOUNTER — Encounter (HOSPITAL_COMMUNITY): Payer: Self-pay

## 2017-06-15 ENCOUNTER — Emergency Department (HOSPITAL_COMMUNITY): Payer: Medicare Other

## 2017-06-15 DIAGNOSIS — N179 Acute kidney failure, unspecified: Secondary | ICD-10-CM | POA: Diagnosis present

## 2017-06-15 DIAGNOSIS — G459 Transient cerebral ischemic attack, unspecified: Secondary | ICD-10-CM

## 2017-06-15 DIAGNOSIS — I4892 Unspecified atrial flutter: Secondary | ICD-10-CM | POA: Diagnosis present

## 2017-06-15 DIAGNOSIS — R451 Restlessness and agitation: Secondary | ICD-10-CM | POA: Diagnosis not present

## 2017-06-15 DIAGNOSIS — J45909 Unspecified asthma, uncomplicated: Secondary | ICD-10-CM | POA: Diagnosis present

## 2017-06-15 DIAGNOSIS — K219 Gastro-esophageal reflux disease without esophagitis: Secondary | ICD-10-CM | POA: Diagnosis present

## 2017-06-15 DIAGNOSIS — E1122 Type 2 diabetes mellitus with diabetic chronic kidney disease: Secondary | ICD-10-CM | POA: Diagnosis present

## 2017-06-15 DIAGNOSIS — E785 Hyperlipidemia, unspecified: Secondary | ICD-10-CM | POA: Diagnosis present

## 2017-06-15 DIAGNOSIS — Z794 Long term (current) use of insulin: Secondary | ICD-10-CM

## 2017-06-15 DIAGNOSIS — I480 Paroxysmal atrial fibrillation: Secondary | ICD-10-CM | POA: Diagnosis present

## 2017-06-15 DIAGNOSIS — Z79899 Other long term (current) drug therapy: Secondary | ICD-10-CM

## 2017-06-15 DIAGNOSIS — E669 Obesity, unspecified: Secondary | ICD-10-CM | POA: Diagnosis present

## 2017-06-15 DIAGNOSIS — Z7982 Long term (current) use of aspirin: Secondary | ICD-10-CM

## 2017-06-15 DIAGNOSIS — I493 Ventricular premature depolarization: Secondary | ICD-10-CM | POA: Diagnosis not present

## 2017-06-15 DIAGNOSIS — N183 Chronic kidney disease, stage 3 (moderate): Secondary | ICD-10-CM | POA: Diagnosis present

## 2017-06-15 DIAGNOSIS — Z6831 Body mass index (BMI) 31.0-31.9, adult: Secondary | ICD-10-CM

## 2017-06-15 DIAGNOSIS — I129 Hypertensive chronic kidney disease with stage 1 through stage 4 chronic kidney disease, or unspecified chronic kidney disease: Secondary | ICD-10-CM | POA: Diagnosis present

## 2017-06-15 HISTORY — DX: Transient cerebral ischemic attack, unspecified: G45.9

## 2017-06-15 LAB — COMPREHENSIVE METABOLIC PANEL
ALBUMIN: 3.5 g/dL (ref 3.5–5.0)
ALK PHOS: 47 U/L (ref 38–126)
ALT: 14 U/L — ABNORMAL LOW (ref 17–63)
ANION GAP: 10 (ref 5–15)
AST: 16 U/L (ref 15–41)
BILIRUBIN TOTAL: 0.9 mg/dL (ref 0.3–1.2)
BUN: 40 mg/dL — ABNORMAL HIGH (ref 6–20)
CALCIUM: 8.8 mg/dL — AB (ref 8.9–10.3)
CO2: 21 mmol/L — ABNORMAL LOW (ref 22–32)
Chloride: 105 mmol/L (ref 101–111)
Creatinine, Ser: 3.06 mg/dL — ABNORMAL HIGH (ref 0.61–1.24)
GFR, EST AFRICAN AMERICAN: 21 mL/min — AB (ref >60)
GFR, EST NON AFRICAN AMERICAN: 18 mL/min — AB (ref >60)
GLUCOSE: 135 mg/dL — AB (ref 65–99)
POTASSIUM: 4.3 mmol/L (ref 3.5–5.1)
Sodium: 136 mmol/L (ref 135–145)
TOTAL PROTEIN: 7.2 g/dL (ref 6.5–8.1)

## 2017-06-15 LAB — I-STAT CHEM 8, ED
BUN: 38 mg/dL — ABNORMAL HIGH (ref 6–20)
CALCIUM ION: 1.13 mmol/L — AB (ref 1.15–1.40)
Chloride: 107 mmol/L (ref 101–111)
Creatinine, Ser: 3.2 mg/dL — ABNORMAL HIGH (ref 0.61–1.24)
Glucose, Bld: 134 mg/dL — ABNORMAL HIGH (ref 65–99)
HEMATOCRIT: 41 % (ref 39.0–52.0)
HEMOGLOBIN: 13.9 g/dL (ref 13.0–17.0)
Potassium: 4.4 mmol/L (ref 3.5–5.1)
SODIUM: 139 mmol/L (ref 135–145)
TCO2: 23 mmol/L (ref 22–32)

## 2017-06-15 LAB — CBG MONITORING, ED: Glucose-Capillary: 120 mg/dL — ABNORMAL HIGH (ref 65–99)

## 2017-06-15 LAB — APTT: aPTT: 32 seconds (ref 24–36)

## 2017-06-15 LAB — PROTIME-INR
INR: 1.03
PROTHROMBIN TIME: 13.4 s (ref 11.4–15.2)

## 2017-06-15 LAB — CBC
HCT: 40.5 % (ref 39.0–52.0)
HEMOGLOBIN: 12.8 g/dL — AB (ref 13.0–17.0)
MCH: 26 pg (ref 26.0–34.0)
MCHC: 31.6 g/dL (ref 30.0–36.0)
MCV: 82.2 fL (ref 78.0–100.0)
Platelets: 261 10*3/uL (ref 150–400)
RBC: 4.93 MIL/uL (ref 4.22–5.81)
RDW: 13.7 % (ref 11.5–15.5)
WBC: 5.2 10*3/uL (ref 4.0–10.5)

## 2017-06-15 LAB — DIFFERENTIAL
Basophils Absolute: 0 10*3/uL (ref 0.0–0.1)
Basophils Relative: 1 %
EOS PCT: 7 %
Eosinophils Absolute: 0.3 10*3/uL (ref 0.0–0.7)
LYMPHS ABS: 2.1 10*3/uL (ref 0.7–4.0)
Lymphocytes Relative: 40 %
MONO ABS: 0.4 10*3/uL (ref 0.1–1.0)
Monocytes Relative: 8 %
NEUTROS PCT: 46 %
Neutro Abs: 2.4 10*3/uL (ref 1.7–7.7)

## 2017-06-15 LAB — I-STAT TROPONIN, ED: TROPONIN I, POC: 0.03 ng/mL (ref 0.00–0.08)

## 2017-06-15 NOTE — ED Triage Notes (Signed)
Pt's wife endorses that at 2000 pt suddenly had slurred speech and "difficulty forming words" Pt's sx resolved at 2100. NIH 0 in triage. No drift or weakness, no slurred speech, and no visual changes. VSS. Pt axox4.

## 2017-06-16 ENCOUNTER — Observation Stay (HOSPITAL_COMMUNITY): Payer: Medicare Other

## 2017-06-16 ENCOUNTER — Other Ambulatory Visit: Payer: Self-pay

## 2017-06-16 ENCOUNTER — Telehealth: Payer: Self-pay

## 2017-06-16 ENCOUNTER — Encounter (HOSPITAL_COMMUNITY): Payer: Self-pay | Admitting: Internal Medicine

## 2017-06-16 ENCOUNTER — Inpatient Hospital Stay (HOSPITAL_COMMUNITY)
Admission: EM | Admit: 2017-06-16 | Discharge: 2017-06-20 | DRG: 069 | Disposition: A | Discharge status: Home / Self Care | Payer: Medicare Other | Attending: Internal Medicine | Admitting: Internal Medicine

## 2017-06-16 DIAGNOSIS — I4892 Unspecified atrial flutter: Secondary | ICD-10-CM

## 2017-06-16 DIAGNOSIS — G459 Transient cerebral ischemic attack, unspecified: Secondary | ICD-10-CM | POA: Diagnosis not present

## 2017-06-16 DIAGNOSIS — N184 Chronic kidney disease, stage 4 (severe): Secondary | ICD-10-CM | POA: Diagnosis present

## 2017-06-16 DIAGNOSIS — I4891 Unspecified atrial fibrillation: Secondary | ICD-10-CM

## 2017-06-16 DIAGNOSIS — Z8679 Personal history of other diseases of the circulatory system: Secondary | ICD-10-CM

## 2017-06-16 DIAGNOSIS — E119 Type 2 diabetes mellitus without complications: Secondary | ICD-10-CM

## 2017-06-16 DIAGNOSIS — N179 Acute kidney failure, unspecified: Secondary | ICD-10-CM

## 2017-06-16 DIAGNOSIS — N189 Chronic kidney disease, unspecified: Secondary | ICD-10-CM

## 2017-06-16 DIAGNOSIS — I10 Essential (primary) hypertension: Secondary | ICD-10-CM

## 2017-06-16 LAB — URINALYSIS, ROUTINE W REFLEX MICROSCOPIC
Bacteria, UA: NONE SEEN
Bilirubin Urine: NEGATIVE
Glucose, UA: NEGATIVE mg/dL
Hgb urine dipstick: NEGATIVE
Ketones, ur: NEGATIVE mg/dL
Leukocytes, UA: NEGATIVE
NITRITE: NEGATIVE
PH: 5 (ref 5.0–8.0)
Protein, ur: >=300 mg/dL — AB
Specific Gravity, Urine: 1.012 (ref 1.005–1.030)

## 2017-06-16 LAB — BASIC METABOLIC PANEL
ANION GAP: 10 (ref 5–15)
BUN: 44 mg/dL — ABNORMAL HIGH (ref 6–20)
CO2: 24 mmol/L (ref 22–32)
Calcium: 8.8 mg/dL — ABNORMAL LOW (ref 8.9–10.3)
Chloride: 103 mmol/L (ref 101–111)
Creatinine, Ser: 3.27 mg/dL — ABNORMAL HIGH (ref 0.61–1.24)
GFR calc Af Amer: 19 mL/min — ABNORMAL LOW (ref >60)
GFR, EST NON AFRICAN AMERICAN: 17 mL/min — AB (ref >60)
Glucose, Bld: 159 mg/dL — ABNORMAL HIGH (ref 65–99)
POTASSIUM: 4.4 mmol/L (ref 3.5–5.1)
Sodium: 137 mmol/L (ref 135–145)

## 2017-06-16 LAB — HEPARIN LEVEL (UNFRACTIONATED): Heparin Unfractionated: 0.35 IU/mL (ref 0.30–0.70)

## 2017-06-16 LAB — LIPID PANEL
CHOL/HDL RATIO: 5.6 ratio
CHOLESTEROL: 320 mg/dL — AB (ref 0–200)
HDL: 57 mg/dL (ref >40)
LDL Cholesterol: 207 mg/dL — ABNORMAL HIGH (ref 0–99)
Triglycerides: 280 mg/dL — ABNORMAL HIGH (ref <150)
VLDL: 56 mg/dL — ABNORMAL HIGH (ref 0–40)

## 2017-06-16 LAB — GLUCOSE, CAPILLARY
Glucose-Capillary: 150 mg/dL — ABNORMAL HIGH (ref 65–99)
Glucose-Capillary: 150 mg/dL — ABNORMAL HIGH (ref 65–99)
Glucose-Capillary: 172 mg/dL — ABNORMAL HIGH (ref 65–99)

## 2017-06-16 LAB — HEMOGLOBIN A1C
Hgb A1c MFr Bld: 7.6 % — ABNORMAL HIGH (ref 4.8–5.6)
MEAN PLASMA GLUCOSE: 171.42 mg/dL

## 2017-06-16 MED ORDER — ACETAMINOPHEN 650 MG RE SUPP
650.0000 mg | RECTAL | Status: DC | PRN
Start: 2017-06-16 — End: 2017-06-20

## 2017-06-16 MED ORDER — ASPIRIN 325 MG PO TABS: 325.0000 mg | ORAL_TABLET | Freq: Every day | ORAL | Status: DC

## 2017-06-16 MED ORDER — ASPIRIN EC 81 MG PO TBEC
81.0000 mg | DELAYED_RELEASE_TABLET | Freq: Every day | ORAL | Status: DC
  Administered 2017-06-17 – 2017-06-20 (×4): 81 mg via ORAL
  Filled 2017-06-16 (×4): qty 1

## 2017-06-16 MED ORDER — HEPARIN (PORCINE) IN NACL 100-0.45 UNIT/ML-% IJ SOLN
1100.0000 [IU]/h | INTRAMUSCULAR | Status: DC
Start: 2017-06-16 — End: 2017-06-20
  Administered 2017-06-16: 1300 [IU]/h via INTRAVENOUS
  Administered 2017-06-17: 1100 [IU]/h via INTRAVENOUS
  Administered 2017-06-18: 850 [IU]/h via INTRAVENOUS
  Administered 2017-06-19: 900 [IU]/h via INTRAVENOUS
  Filled 2017-06-16 (×4): qty 250

## 2017-06-16 MED ORDER — ASPIRIN 81 MG PO CHEW: 324.0000 mg | CHEWABLE_TABLET | Freq: Once | ORAL | Status: DC

## 2017-06-16 MED ORDER — HEPARIN SODIUM (PORCINE) 5000 UNIT/ML IJ SOLN: 5000.0000 [IU] | Freq: Three times a day (TID) | INTRAMUSCULAR | Status: DC

## 2017-06-16 MED ORDER — INSULIN ASPART 100 UNIT/ML ~~LOC~~ SOLN
0.0000 [IU] | Freq: Three times a day (TID) | SUBCUTANEOUS | Status: DC
  Administered 2017-06-16 – 2017-06-17 (×3): 1 [IU] via SUBCUTANEOUS
  Administered 2017-06-17: 2 [IU] via SUBCUTANEOUS
  Administered 2017-06-17 – 2017-06-18 (×2): 1 [IU] via SUBCUTANEOUS
  Administered 2017-06-18: 2 [IU] via SUBCUTANEOUS
  Administered 2017-06-18: 1 [IU] via SUBCUTANEOUS
  Administered 2017-06-19 – 2017-06-20 (×3): 2 [IU] via SUBCUTANEOUS

## 2017-06-16 MED ORDER — SENNOSIDES-DOCUSATE SODIUM 8.6-50 MG PO TABS
1.0000 | ORAL_TABLET | Freq: Every day | ORAL | Status: DC
  Administered 2017-06-16 – 2017-06-20 (×5): 1 via ORAL
  Filled 2017-06-16 (×5): qty 1

## 2017-06-16 MED ORDER — STROKE: EARLY STAGES OF RECOVERY BOOK
Freq: Once | Status: AC
  Administered 2017-06-16: 13:00:00

## 2017-06-16 MED ORDER — ASPIRIN EC 81 MG PO TBEC: 81.0000 mg | DELAYED_RELEASE_TABLET | Freq: Every day | ORAL | Status: DC

## 2017-06-16 MED ORDER — ACETAMINOPHEN 160 MG/5ML PO SOLN: 650.0000 mg | ORAL | Status: DC | PRN

## 2017-06-16 MED ORDER — PANTOPRAZOLE SODIUM 40 MG PO TBEC
40.0000 mg | DELAYED_RELEASE_TABLET | Freq: Every day | ORAL | Status: DC
  Administered 2017-06-16 – 2017-06-20 (×5): 40 mg via ORAL
  Filled 2017-06-16 (×5): qty 1

## 2017-06-16 MED ORDER — ATORVASTATIN CALCIUM 80 MG PO TABS
80.0000 mg | ORAL_TABLET | Freq: Every day | ORAL | Status: DC
  Administered 2017-06-16 – 2017-06-18 (×3): 80 mg via ORAL
  Filled 2017-06-16 (×3): qty 1

## 2017-06-16 MED ORDER — ACETAMINOPHEN 325 MG PO TABS: 650.0000 mg | ORAL_TABLET | ORAL | Status: DC | PRN

## 2017-06-16 MED ORDER — STROKE: EARLY STAGES OF RECOVERY BOOK
Freq: Once | Status: DC
  Filled 2017-06-16: qty 1

## 2017-06-16 NOTE — Progress Notes (Signed)
Patient is OTF, RN to put back IV consult when pt come back.

## 2017-06-16 NOTE — Progress Notes (Signed)
SLP Cancellation Note  Patient Details Name: Todd [name] MRN: [id number] DOB: Dec 01, 1940   Cancelled treatment:       Reason Eval/Treat Not Completed: SLP screened, no needs identified, will sign off   Pt and wife present during screen. Both report that word finding deficits have resolved. Please re-consult if pt's condition changes.    [name] 06/16/2017, 2:53 PM

## 2017-06-16 NOTE — Evaluation (Signed)
Physical Therapy Evaluation and Discharge Patient Details Name: Todd [name] MRN: [id number] DOB: December 11, 1940 Today's Date: 06/16/2017   History of Present Illness  Pt is a 77 y/o male admitted secondary to dizziness, blurred vision and speech difficulties. Since admission, pt reports symptoms have resolved. CT and MRI negative for acute abnormality. PMH includes CKD, HTN, and DM.   Clinical Impression  Patient evaluated by Physical Therapy with no further acute PT needs identified. All education has been completed and the patient has no further questions. Pt overall steady with dynamic gait tasks and stair navigation, requiring gross supervision. Scored 20 on DGI indicating low fall risk. See below for any follow-up Physical Therapy or equipment needs. PT is signing off. Thank you for this referral. If needs change, please re-consult.      Follow Up Recommendations No PT follow up    Equipment Recommendations  None recommended by PT    Recommendations for Other Services       Precautions / Restrictions Precautions Precautions: None Restrictions Weight Bearing Restrictions: No      Mobility  Bed Mobility Overal bed mobility: Modified Independent             General bed mobility comments: Increased time, however, no assist required.   Transfers Overall transfer level: Modified independent               General transfer comment: Able to bend down to reach for glove on the floor and pick it up without LOB.   Ambulation/Gait Ambulation/Gait assistance: Supervision Ambulation Distance (Feet): 200 Feet Assistive device: None Gait Pattern/deviations: Step-through pattern;Decreased stride length Gait velocity: Decreased  Gait velocity interpretation: Below normal speed for age/gender General Gait Details: Slower gait speed by choice, but able to speed up upon request. Able to perform dynamic balance tasks without LOB this session.   Stairs Stairs: Yes Stairs  assistance: Supervision Stair Management: One rail Left;Alternating pattern;Forwards Number of Stairs: 4 General stair comments: Supervision for safety. Overall steady with stair navigation and demonstrated safe technique.   Wheelchair Mobility    Modified Rankin (Stroke Patients Only) Modified Rankin (Stroke Patients Only) Pre-Morbid Rankin Score: No symptoms Modified Rankin: Moderately severe disability     Balance Overall balance assessment: Needs assistance Sitting-balance support: No upper extremity supported;Feet supported Sitting balance-Leahy Scale: Normal     Standing balance support: No upper extremity supported;During functional activity Standing balance-Leahy Scale: Good                   Standardized Balance Assessment Standardized Balance Assessment : Dynamic Gait Index   Dynamic Gait Index Level Surface: Mild Impairment Change in Gait Speed: Normal Gait with Horizontal Head Turns: Normal Gait with Vertical Head Turns: Normal Gait and Pivot Turn: Mild Impairment Step Over Obstacle: Mild Impairment Step Around Obstacles: Normal Steps: Mild Impairment Total Score: 20       Pertinent Vitals/Pain Pain Assessment: No/denies pain    Home Living Family/patient expects to be discharged to:: Private residence Living Arrangements: Spouse/significant other Available Help at Discharge: Family;Available 24 hours/day Type of Home: House Home Access: Stairs to enter Entrance Stairs-Rails: Right;Left;Can reach both Entrance Stairs-Number of Steps: 1 Home Layout: Two level Home Equipment: None      Prior Function Level of Independence: Independent         Comments: Pt active and going to the [facility] for exercise.      Hand Dominance   Dominant Hand: Right    Extremity/Trunk Assessment   Upper Extremity  Assessment Upper Extremity Assessment: Defer to OT evaluation    Lower Extremity Assessment Lower Extremity Assessment: Overall WFL for tasks  assessed    Cervical / Trunk Assessment Cervical / Trunk Assessment: Normal  Communication   Communication: No difficulties  Cognition Arousal/Alertness: Awake/alert Behavior During Therapy: WFL for tasks assessed/performed Overall Cognitive Status: Within Functional Limits for tasks assessed                                        General Comments General comments (skin integrity, edema, etc.): Multiple family members present during session. HR elevated to low 140s during mobility; notified RN and RN aware.     Exercises     Assessment/Plan    PT Assessment Patent does not need any further PT services  PT Problem List         PT Treatment Interventions      PT Goals (Current goals can be found in the Care Plan section)  Acute Rehab PT Goals Patient Stated Goal: to go home  PT Goal Formulation: With patient Time For Goal Achievement: 06/16/17 Potential to Achieve Goals: Good    Frequency     Barriers to discharge        Co-evaluation               AM-PAC PT "6 Clicks" Daily Activity  Outcome Measure Difficulty turning over in bed (including adjusting bedclothes, sheets and blankets)?: None Difficulty moving from lying on back to sitting on the side of the bed? : A Little Difficulty sitting down on and standing up from a chair with arms (e.g., wheelchair, bedside commode, etc,.)?: A Little Help needed moving to and from a bed to chair (including a wheelchair)?: None Help needed walking in hospital room?: None Help needed climbing 3-5 steps with a railing? : None 6 Click Score: 22    End of Session Equipment Utilized During Treatment: Gait belt Activity Tolerance: Patient tolerated treatment well Patient left: in bed;with call bell/phone within reach;with family/visitor present Nurse Communication: Mobility status;Other (comment)(elevated HR. ) PT Visit Diagnosis: Other symptoms and signs involving the nervous system (W38.937)    Time:  1731-1751 PT Time Calculation (min) (ACUTE ONLY): 20 min   Charges:   PT Evaluation $PT Eval Low Complexity: 1 Low     PT G Codes:        Leighton Ruff, PT, DPT  Acute Rehabilitation Services  Pager: [phone]   Rudean Hitt 06/16/2017, 6:49 PM

## 2017-06-16 NOTE — Progress Notes (Signed)
[place] for Heparin Indication: CVA, afib  Allergies  Allergen Reactions  . Lisinopril Cough  . Metformin And Related Other (See Comments)    chronic kidney disease    Patient Measurements: Height: 6\' 1"  (185.4 cm) Weight: 235 lb (106.6 kg) IBW/kg (Calculated) : 79.9 Heparin Dosing Weight:  102 kg  Vital Signs: Temp: 98.3 F (36.8 C) (03/14 1533) Temp Source: Oral (03/14 1533) BP: 142/74 (03/14 1533) Pulse Rate: 64 (03/14 1533)  Labs: Recent Labs    06/15/17 2153 06/15/17 2201 06/16/17 0724 06/16/17 1631  HGB 12.8* 13.9  --   --   HCT 40.5 41.0  --   --   PLT 261  --   --   --   APTT 32  --   --   --   LABPROT 13.4  --   --   --   INR 1.03  --   --   --   HEPARINUNFRC  --   --   --  0.35  CREATININE 3.06* 3.20* 3.27*  --     Estimated Creatinine Clearance: 23.9 mL/min (A) (by C-G formula based on SCr of 3.27 mg/dL (H)).   Medical History: Past Medical History:  Diagnosis Date  . Asthma    history of asthma  . Chronic kidney disease   . Diabetes mellitus without complication ([place])   . GERD (gastroesophageal reflux disease)   . Hypertension     Assessment: CC/HPI: chest tightness, dizzyness, lightheadedness with blurred vision, 2 episodes of dysarthria with word finding difficulties. Noted to be in Afib in ED  PMH: hypertension, dyslipidemia, CKD stage III, diabetes mellitus, asthma,   Anticoag: Neurology note says to Start heparin drip with ischemic stroke protocol. New afib. No anticoag PTA. Baseline CBC WNL  Heparin level 0.35  Goal of Therapy:  Heparin level 0.3-0.5 units/ml Monitor platelets by anticoagulation protocol: Yes   Plan:  Continue heparin at 1300 units / hr Daily HL and CBC  Thank you Anette Guarneri, PharmD [phone] 06/16/2017,5:43 PM

## 2017-06-16 NOTE — Care Management Note (Signed)
Case Management Note  Patient Details  Name: Karlis Cregg MRN: [id number] Date of Birth: [birth year]  Subjective/Objective:  Pt in to r/o CVA. He is from home with spouse. Hx: hypertension, hyperlipidemia, diabetes mellitus type 2, chronic kidney disease. PCP: none listed                 Action/Plan: Awaiting PT/OT evals. CM following for d/c needs, physician orders.    Expected Discharge Date:                  Expected Discharge Plan:     In-House Referral:     Discharge planning Services     Post Acute Care Choice:    Choice offered to:     DME Arranged:    DME Agency:     HH Arranged:    HH Agency:     Status of Service:  In process, will continue to follow  If discussed at Long Length of Stay Meetings, dates discussed:    Additional Comments:  Pollie Friar, RN 06/16/2017, 11:28 AM

## 2017-06-16 NOTE — Progress Notes (Addendum)
ANTICOAGULATION CONSULT NOTE - Initial Consult  Pharmacy Consult for Heparin Indication: CVA, afib  Allergies  Allergen Reactions  . Lisinopril Cough  . Metformin And Related Other (See Comments)    chronic kidney disease    Patient Measurements: Height: 6\' 1"  (185.4 cm) Weight: 235 lb (106.6 kg) IBW/kg (Calculated) : 79.9 Heparin Dosing Weight:  102 kg  Vital Signs: Temp: 97.7 F (36.5 C) (03/14 0914) Temp Source: Oral (03/14 0914) BP: 158/93 (03/14 0914) Pulse Rate: 62 (03/14 0914)  Labs: Recent Labs    06/15/17 2153 06/15/17 2201 06/16/17 0724  HGB 12.8* 13.9  --   HCT 40.5 41.0  --   PLT 261  --   --   APTT 32  --   --   LABPROT 13.4  --   --   INR 1.03  --   --   CREATININE 3.06* 3.20* 3.27*    Estimated Creatinine Clearance: 23.9 mL/min (A) (by C-G formula based on SCr of 3.27 mg/dL (H)).   Medical History: Past Medical History:  Diagnosis Date  . Asthma    history of asthma  . Chronic kidney disease   . Diabetes mellitus without complication ([place])   . GERD (gastroesophageal reflux disease)   . Hypertension     Assessment: CC/HPI: chest tightness, dizzyness, lightheadedness with blurred vision, 2 episodes of dysarthria with word finding difficulties. Noted to be in Afib in ED  PMH: hypertension, dyslipidemia, CKD stage III, diabetes mellitus, asthma,   Anticoag: Neurology note says to Start heparin drip with ischemic stroke protocol. New afib. No anticoag PTA. Baseline CBC WNL  Goal of Therapy:  Heparin level 0.3-0.5 units/ml Monitor platelets by anticoagulation protocol: Yes   Plan:  Start IV heparin (no bolus) at 1300 units/hr Check heparin level in 6 hrs Daily HL and CBC  Todd [name], PharmD, BCPS Clinical Staff Pharmacist Pager [phone]  Todd [name] 06/16/2017,9:55 AM

## 2017-06-16 NOTE — ED Notes (Signed)
Patient is alert oriented , no complaints at present

## 2017-06-16 NOTE — ED Provider Notes (Addendum)
[place] EMERGENCY DEPARTMENT Provider Note   CSN: [id number] Arrival date & time: 06/15/17  2131     History   Chief Complaint Chief Complaint  Patient presents with  . Aphasia    HPI Todd [name] is a 77 y.o. male.  HPI 4 days ago (3/11) on Monday, the patient was at the [facility] exercising.  He was walking and talking with a friend.  He reports he got a very dizzy feeling and for about 20 minutes had difficulty talking.  He reports he could not make the words.  He did go home and by the time he was having dinner around 6:00 it had resolved.  There were no further symptoms on Tuesday.  Wednesday he spent most of the day watching games on television.  He came down about 6 PM and had dinner with his wife.  She reports he was normal at that time.  At about 7 he called to her and she could not understand what he was saying.  She reports his speech was not intelligible.  She reports however he walked down the stairs normally and did not seem to have any focal weakness.  He sat down and she took his vital signs.  She reports his heart rate read at 140s.  She had him stay seated and calmed him.  Within about 20 minutes the symptoms had resolved and his speech was again normal.  She reports his heart rate had gone back to normal as well.  At that time the made preparations to come to the hospital.  Patient has no prior history of atrial fibrillation or atrial flutter.  He does have known history of chronic renal insufficiency but reports he was told by his doctor at the [place], "his numbers were back to normal". Past Medical History:  Diagnosis Date  . Asthma    history of asthma  . Chronic kidney disease   . Diabetes mellitus without complication ([place])   . GERD (gastroesophageal reflux disease)   . Hypertension     Patient Active Problem List   Diagnosis Date Noted  . TIA (transient ischemic attack) 06/16/2017  . Abdominal pain, generalized 10/17/2014    Past Surgical  History:  Procedure Laterality Date  . ESOPHAGOGASTRODUODENOSCOPY N/A 10/17/2014   Procedure: ESOPHAGOGASTRODUODENOSCOPY (EGD);  Surgeon: Wilford Corner, MD;  Location[facility] ENDOSCOPY;  Service: Endoscopy;  Laterality: N/A;  . NO PAST SURGERIES         Home Medications    Prior to Admission medications   Medication Sig Start Date End Date Taking? Authorizing Provider  amLODipine (NORVASC) 10 MG tablet Take 10 mg by mouth daily.   Yes [provider]  aspirin 325 MG tablet Take 325 mg by mouth daily.   Yes [provider]  furosemide (LASIX) 40 MG tablet Take 60 mg by mouth 2 (two) times daily.   Yes [provider]  glipiZIDE (GLUCOTROL) 10 MG tablet Take 20 mg by mouth 2 (two) times daily before a meal.   Yes [provider]  insulin NPH Human (HUMULIN N,NOVOLIN N) 100 UNIT/ML injection Inject 10 Units into the skin every evening.   Yes [provider]  lactulose (CHRONULAC) 10 GM/15ML solution Take 10 g by mouth daily as needed for mild constipation or moderate constipation.   Yes [provider]  losartan (COZAAR) 100 MG tablet Take 100 mg by mouth daily.   Yes [provider]  metoprolol (LOPRESSOR) 50 MG tablet Take 25 mg  by mouth 2 (two) times daily.    Yes [provider]  omeprazole (PRILOSEC) 20 MG capsule Take 1 capsule by mouth daily. 09/24/14  Yes [provider]  ondansetron (ZOFRAN) 4 MG tablet Take 1 tablet (4 mg total) by mouth every 6 (six) hours. Patient taking differently: Take 4 mg by mouth every 6 (six) hours as needed for nausea or vomiting.  10/05/14  Yes Quintella Reichert, MD  sennosides-docusate sodium (SENOKOT-S) 8.6-50 MG tablet Take 1 tablet by mouth 2 (two) times daily as needed for constipation.   Yes [provider]  Sodium Phosphates (RA SALINE ENEMA RE) Place 1 application rectally as needed (take every other day as needed for constipation).   Yes [provider]     Family History History reviewed. No pertinent family history.  Social History Social History   Tobacco Use  . Smoking status: Never Smoker  Substance Use Topics  . Alcohol use: No  . Drug use: No     Allergies   Lisinopril and Metformin and related   Review of Systems Review of Systems 10 Systems reviewed and are negative for acute change except as noted in the HPI.  Physical Exam Updated Vital Signs BP 138/80 (BP Location: Right Arm)   Pulse (!) 112   Temp 98.1 F (36.7 C)   Resp 14   Ht 6\' 1"  (1.854 m)   Wt 106.6 kg (235 lb)   SpO2 100%   BMI 31.00 kg/m   Physical Exam  Constitutional: He is oriented to person, place, and time. He appears well-developed and well-nourished. No distress.  HENT:  Head: Normocephalic and atraumatic.  Nose: Nose normal.  Mouth/Throat: Oropharynx is clear and moist.  Eyes: Conjunctivae and EOM are normal. Pupils are equal, round, and reactive to light.  Neck: Neck supple.  Cardiovascular: Normal rate.  No murmur heard. Rate is normal.  Irregularly irregular.  Pulmonary/Chest: Effort normal and breath sounds normal. No respiratory distress.  Abdominal: Soft. There is no tenderness.  Musculoskeletal: Normal range of motion. He exhibits no edema or tenderness.  Neurological: He is alert and oriented to person, place, and time. No cranial nerve deficit or sensory deficit. He exhibits normal muscle tone. Coordination normal.  Skin: Skin is warm and dry.  Psychiatric: He has a normal mood and affect.  Nursing note and vitals reviewed.    ED Treatments / Results  Labs (all labs ordered are listed, but only abnormal results are displayed) Labs Reviewed  CBC - Abnormal; Notable for the following components:      Result Value   Hemoglobin 12.8 (*)    All other components within normal limits  COMPREHENSIVE METABOLIC PANEL - Abnormal; Notable for the following components:   CO2 21 (*)    Glucose, Bld 135 (*)    BUN 40 (*)     Creatinine, Ser 3.06 (*)    Calcium 8.8 (*)    ALT 14 (*)    GFR calc non Af Amer 18 (*)    GFR calc Af Amer 21 (*)    All other components within normal limits  CBG MONITORING, ED - Abnormal; Notable for the following components:   Glucose-Capillary 120 (*)    All other components within normal limits  I-STAT CHEM 8, ED - Abnormal; Notable for the following components:   BUN 38 (*)    Creatinine, Ser 3.20 (*)    Glucose, Bld 134 (*)    Calcium, Ion 1.13 (*)    All  other components within normal limits  PROTIME-INR  APTT  DIFFERENTIAL  BASIC METABOLIC PANEL  URINALYSIS, ROUTINE W REFLEX MICROSCOPIC  I-STAT TROPONIN, ED    EKG  EKG Interpretation  Date/Time:  Thursday June 16 2017 07:18:41 EDT Ventricular Rate:  120 PR Interval:    QRS Duration: 111 QT Interval:  298 QTC Calculation: 394 R Axis:   29 Text Interpretation:  Atrial flutter Incomplete left bundle branch block no acute ischemic appearance. continued flutter/fib Confirmed by Charlesetta Shanks [id number]) on 06/16/2017 7:40:39 AM       Radiology Ct Head Wo Contrast  Result Date: 06/15/2017 CLINICAL DATA:  Acute onset of dizziness and aphasia. Transient ischemic attack. EXAM: CT HEAD WITHOUT CONTRAST TECHNIQUE: Contiguous axial images were obtained from the base of the skull through the vertex without intravenous contrast. COMPARISON:  None. FINDINGS: Brain: No evidence of acute infarction, hemorrhage, hydrocephalus, extra-axial collection or mass lesion / mass effect. Prominence of the sulci suggests mild cortical volume loss. The brainstem and fourth ventricle are within normal limits. The basal ganglia are unremarkable in appearance. The cerebral hemispheres demonstrate grossly normal gray-white differentiation. No mass effect or midline shift is seen. Vascular: No hyperdense vessel or unexpected calcification. Skull: There is no evidence of fracture; visualized osseous structures are unremarkable in appearance.  Sinuses/Orbits: The orbits are within normal limits. The paranasal sinuses and mastoid air cells are well-aerated. Other: No significant soft tissue abnormalities are seen. IMPRESSION: 1. No acute intracranial pathology seen on CT. 2. Mild cortical volume loss. Electronically Signed   By: Garald Balding M.D.   On: 06/15/2017 22:58    Procedures Procedures (including critical care time) CRITICAL CARE Performed by: Si Gaul   Total critical care time: 30 minutes  Critical care time was exclusive of separately billable procedures and treating other patients.  Critical care was necessary to treat or prevent imminent or life-threatening deterioration.  Critical care was time spent personally by me on the following activities: development of treatment plan with patient and/or surrogate as well as nursing, discussions with consultants, evaluation of patient's response to treatment, examination of patient, obtaining history from patient or surrogate, ordering and performing treatments and interventions, ordering and review of laboratory studies, ordering and review of radiographic studies, pulse oximetry and re-evaluation of patient's condition. Medications Ordered in ED Medications  aspirin chewable tablet 324 mg (not administered)     Initial Impression / Assessment and Plan / ED Course  I have reviewed the triage vital signs and the nursing notes.  Pertinent labs & imaging results that were available during my care of the patient were reviewed by me and considered in my medical decision making (see chart for details).    Consult: Neurology (07: 20) reviewed with Dr. Cheral Marker.  We will proceed with MRI/MRA due to patient's renal insufficiency.  Administer aspirin at this time. Consult:(07:35) Dr. Charlynn Grimes Internal medicine teaching service for admission.  Final Clinical Impressions(s) / ED Diagnoses   Final diagnoses:  TIA (transient ischemic attack)  Atrial fibrillation and flutter  ([place])  AKI (acute kidney injury) ([place])   Patient presents with 2 episodes of expressive a aphasia, 3 days ago and yesterday evening.  These have resolved within approximately 20 minutes.  Patient also has apparent new onset atrial fibrillation.  He has no known history.  At this time, neurologic exam is normal.  Patient is at baseline.  Have reviewed with neurology and will proceed with MRI/MRA due to patient's renal insufficiency. ED Discharge Orders  None       Charlesetta Shanks, MD 06/16/17 [place], [place], MD 06/16/17 [phone]

## 2017-06-16 NOTE — Consult Note (Addendum)
Referring Physician: Lucious Groves, DO    Chief Complaint: Aphasia  HPI: Todd [name] is an 77 y.o. male with a past medical history of hypertension, dyslipidemia, CKD stage III, diabetes mellitus who presented to the ED with complaints of 2 episodes of dysarthria with word finding difficulties.  Patient was in his usual state of health on Monday, 06/13/2017 when while exercising at the [facility] around 730, walking around the track, he felt acute onset chest tightness, dizziness and lightheadedness with blurred vision. He noted he had dysarthria with difficulty getting his words out.  He sat down for a little bit, drove to his friend's house for a few minutes and then went home.  When he got home his wife noted his speech was abnormal as he couldn't get his words out, but states patient did not have facial droop, slurred speech, or focal motor deficits or weakness. Symptoms persisted until about 20 minutes after he got home totaling almost an hour total. Tuesday and Wednesday throughout the day patient did not have any further episodes.  Around 7:30 PM Wednesday night after dinner patient went upstairs and called out to his wife for something he needed downstairs.  Wife noted that his speech at that time was again not normal, being unintelligible and gibberish. She felt somewhat relieved that despite the speech problem her husband appeared to be walking normally down the stairs without focal weakness.  Wife noted patient had dysarthria and could not get his words out; patient did state to her that " I cant get my words out".  Patient states that at that time there was associated lightheadedness, blurred vision and dizziness, like the previous episode.  Wife denies patient having any confusion, loss of consciousness, jerking movements of extremities.  Symptoms lasted 30 minutes.  She did check patient's blood pressure and it was low at 80s over 50s and also with a concerning heart rate of 148.  She brought patient  to the ER for evaluation and he was noted to be in A. fib with RVR. He has no prior history of a-fib.  At baseline patient has CKD stage III and follows with nephrology at the [facility].  On admission creat was 3.06, electrolytes and troponin were within normal limits.  CT of the head showed  no acute intracranial pathology.  Patient is back to his baseline mentation and motor function.  Neuro was consulted for TIA evaluation.  Patient denies associated shortness of breath recent fevers, chills, abdominal pain or trauma.  LSN: 06/14/17 7:30pm  tPA Given: No: Resolved symptoms with NIHSS of 0. Premorbid modified Rankin scale (mRS):0   Past Medical History:  Diagnosis Date  . Asthma    history of asthma  . Chronic kidney disease   . Diabetes mellitus without complication ([place])   . GERD (gastroesophageal reflux disease)   . Hypertension     Past Surgical History:  Procedure Laterality Date  . ESOPHAGOGASTRODUODENOSCOPY N/A 10/17/2014   Procedure: ESOPHAGOGASTRODUODENOSCOPY (EGD);  Surgeon: Wilford Corner, MD;  Location: [facility] ENDOSCOPY;  Service: Endoscopy;  Laterality: N/A;  . NO PAST SURGERIES      History reviewed. No pertinent family history. Social History:  reports that  has never smoked. He does not have any smokeless tobacco history on file. He reports that he does not drink alcohol or use drugs.  Allergies:  Allergies  Allergen Reactions  . Lisinopril Cough  . Metformin And Related Other (See Comments)    chronic kidney disease    Medications:  .  stroke: mapping our early stages of recovery book   Does not apply Once  . aspirin  324 mg Oral Once  . aspirin  325 mg Oral Daily  . atorvastatin  80 mg Oral q1800   ROS: 13 point systems reviewed with patient and are negative except for above in HPI  Physical Examination: Blood pressure 138/80, pulse (!) 112, temperature (!) 97.5 F (36.4 C), resp. rate 14, height 6\' 1"  (1.854 m), weight 106.6 kg (235 lb), SpO2 100  %.  HEENT-  Normocephalic, no lesions, without obvious abnormality.  Normal external eye and conjunctiva.   Cardiovascular-irregularly irregular heart rhythm, heart rate in the 140s,  pulses palpable throughout   Lungs-no rhonchi or wheezing noted, no excessive working breathing.  Saturations within normal limits Abdomen- All 4 quadrants palpated and nontender Musculoskeletal-no joint tenderness, deformity or swelling Skin-warm and dry, no hyperpigmentation, vitiligo, or suspicious lesions  Neurological Examination Mental Status: Alert, oriented, thought content appropriate.  Speech fluent without evidence of aphasia.  Able to follow 3 step commands without difficulty.  Normal repetition, comprehension and recall. Cranial Nerves: II: Visual fields grossly normal. III,IV, VI: ptosis not present, extra-ocular motions intact bilaterally pupils equal, round, reactive to light and accommodation V,VII: smile symmetric, facial light touch sensation normal bilaterally VIII: Hearing intact to voice IX,X: uvula rises symmetrically XI: bilateral shoulder shrug XII: midline tongue extension Motor: Right : Upper extremity   5/5    Left:     Upper extremity   5/5  Lower extremity   5/5     Lower extremity   5/5 Tone and bulk:normal tone throughout; no atrophy noted Sensory: Pinprick and light touch intact throughout, bilaterally Deep Tendon Reflexes: 2+ and symmetric throughout Plantars: Right: downgoing   Left: downgoing Cerebellar: normal finger-to-nose, normal rapid alternating movements and normal heel-to-shin test Gait: Not assesed  NIHSS 1a Level of Conscious.: 0 1b LOC Questions: 0 1c LOC Commands: 0 2 Best Gaze: 0 3 Visual: 0 4 Facial Palsy: 0 5a Motor Arm - left: 0 5b Motor Arm - Right: 0 6a Motor Leg - Left: 0 6b Motor Leg - Right: 0 7 Limb Ataxia: 0 8 Sensory: 0 9 Best Language: 0 10 Dysarthria: 0 11 Extinct. and Inatten.: 0 TOTAL: 0  Ct Head Wo Contrast  06/15/2017 IMPRESSION:  1. No acute intracranial pathology seen on CT.  2. Mild cortical volume loss.   Assessment: 77 y.o. male with a past medical history of hypertension, dyslipidemia, CKD stage III and diabetes mellitus who presented to the ED with complaints of 2 episodes of dysarthria with word finding difficulties.  1.  TIA with intermittent dysarthria in patient with new onset A. fib.  Patient presented with 2 episodes of dysarthria with word finding difficulties, no associated motor deficits.  Symptoms last for about 30 minutes to 1 hour.  Initial CT of the head was negative.  These TIA symptoms are concerning as it is in the setting of new onset A. fib, we will pursue full stroke workup with  MRI of the brain, MRA of the head and neck as well as echocardiogram to rule out mural or atrial appendage thrombus.  We will continue heparin and high-dose statin. He will need evaluation by rehab therapy, physical therapy occupational therapy and speech therapy. Pt with stroke Risk Factors - atrial fibrillation, diabetes mellitus, family history, hyperlipidemia and hypertension  2.  New onset A. fib with RVR - started heparin drip. Will need cardiology consult for anti- arrhythmics and  possible cardioversion. Also suspect possible small MI at the time of his chest pain just prior to onset of his neurological symptoms, which may have precipitated new onset a-fib. Monitor hemodynamic stability closely 3.  Hypertension: Continue home antihypertensive regimen 4.  CKD stage III continue to monitor BUN/creatinine closely and avoid nephrotoxic drugs.  5. DM  Recommendations: - Started heparin drip with no bolus. Pharmacy to dose. - Maintain hemodynamic stability and continue anti-arrhythmics and  antihypertensive regimen - HgbA1c, fasting lipid panel - MRI, MRA  of the brain without contrast - PT consult, OT consult, Speech consult - Echocardiogram and carotid ultrasound - High dose Statin 80mg  - Risk  factor modification - Telemetry monitoring - especially in the setting of Afib  - Frequent neuro checks - Avoid nephrotoxic drugs - Cardiology consult for possible cardioversion in the setting of new onset a-fib, as well as to assess for possible recent MI. Also would appreciate input regarding pharmacologic rate control for his new onset a-fib.   Jacob Moores DNP Neuro-hospitalist Team [phone] 06/16/2017, 9:10 AM  I have seen and examined the patient. Assessment and recommendations discussed with neurology team, patient and his wife.  Electronically signed: Dr. Kerney Elbe

## 2017-06-16 NOTE — H&P (Signed)
Date: 06/16/2017               Patient Name:  Todd [name] MRN: [id number]  DOB: [birth year] Age / Sex: 77 y.o., male   PCP: Patient, No Pcp Per         Medical Service: Internal Medicine Teaching Service         Attending Physician: Dr. Heber Wheaton, Rachel Moulds, DO    First Contact: Dr. Frederico Hamman Pager: [phone]  Second Contact: Dr. Jari Favre Pager: [phone]       After Hours (After 5p/  First Contact Pager: [phone]  weekends / holidays): Second Contact Pager: [phone]   Chief Complaint: Dizziness, blurred vision, speech difficulty  History of Present Illness:  Todd [name] is a 77 year old right-hand-dominant male with a past medical history significant for hypertension, hyperlipidemia, diabetes mellitus type 2, chronic kidney disease who presented to the emergency department yesterday evening with complaints of dizziness, blurred vision and speech difficulty.  He reports that 3 days ago on Monday, 06/13/17 he was at the [facility] walking.  At the end of his walk he reports feeling dizzy with blurred vision.  He reports he sat down and had difficulty with his speech.  He reports that he could not get the words out and had garbled and unintelligible speech.  He reports no difficulties understanding what others were saying and had no issues thinking of the word but he could not get the words to come out.  This lasted for 20-30 minutes and resolved on its own.  He had no further symptoms until last night.  He reports that he had another episode with the same symptoms of dizziness, blurred vision and unintelligible speech.  His wife at bedside reports that she did not notice any facial drooping or other focal neurologic deficits.  He denies any focal weakness or change in sensation.  His wife is a retired [employer] scientist and took his vitals and noted his heart rate in the 140s.  His symptoms once again resolved within 20-30 minutes.  However, his wife made him come to the emergency department after this episode.  On arrival to  the emergency department he had no further symptoms.  He denies any chest pain, palpitations, shortness of breath.  ED course: In the emergency department he was noted to be in A. fib.  He has no known history of previous A. fib.  He had no neurologic deficits by ED provider exam.  CT head without any acute intracranial process.  Neurology was consulted in the emergency department and recommended admission for stroke workup.   Meds:  Current Meds  Medication Sig  . amLODipine (NORVASC) 10 MG tablet Take 10 mg by mouth daily.  Marland Kitchen aspirin 325 MG tablet Take 325 mg by mouth daily.  . furosemide (LASIX) 40 MG tablet Take 60 mg by mouth 2 (two) times daily.  Marland Kitchen glipiZIDE (GLUCOTROL) 10 MG tablet Take 20 mg by mouth 2 (two) times daily before a meal.  . insulin NPH Human (HUMULIN N,NOVOLIN N) 100 UNIT/ML injection Inject 10 Units into the skin every evening.  . lactulose (CHRONULAC) 10 GM/15ML solution Take 10 g by mouth daily as needed for mild constipation or moderate constipation.  Marland Kitchen losartan (COZAAR) 100 MG tablet Take 100 mg by mouth daily.  . metoprolol (LOPRESSOR) 50 MG tablet Take 25 mg by mouth 2 (two) times daily.   Marland Kitchen omeprazole (PRILOSEC) 20 MG capsule Take 1 capsule by mouth daily.  . ondansetron (ZOFRAN)  4 MG tablet Take 1 tablet (4 mg total) by mouth every 6 (six) hours. (Patient taking differently: Take 4 mg by mouth every 6 (six) hours as needed for nausea or vomiting. )  . sennosides-docusate sodium (SENOKOT-S) 8.6-50 MG tablet Take 1 tablet by mouth 2 (two) times daily as needed for constipation.  . Sodium Phosphates (RA SALINE ENEMA RE) Place 1 application rectally as needed (take every other day as needed for constipation).     Allergies: Allergies as of 06/15/2017 - Review Complete 06/15/2017  Allergen Reaction Noted  . Lisinopril Cough 10/05/2014  . Metformin and related Other (See Comments) 10/05/2014   Past Medical History:  Diagnosis Date  . Asthma    history of  asthma  . Chronic kidney disease   . Diabetes mellitus without complication ([place])   . GERD (gastroesophageal reflux disease)   . Hypertension     Family History:  Family History  Problem Relation Age of Onset  . Diabetes Mother   . Diabetes Father   . Stroke Father   . Diabetes Sister    Social History:  Social History   Socioeconomic History  . Marital status: Married    Spouse name: None  . Number of children: None  . Years of education: None  . Highest education level: None  Social Needs  . Financial resource strain: None  . Food insecurity - worry: None  . Food insecurity - inability: None  . Transportation needs - medical: None  . Transportation needs - non-medical: None  Occupational History  . None  Tobacco Use  . Smoking status: Never Smoker  . Smokeless tobacco: Never Used  Substance and Sexual Activity  . Alcohol use: No  . Drug use: No  . Sexual activity: None  Other Topics Concern  . None  Social History Narrative  . None    Review of Systems: A complete ROS was negative except as per HPI.   Physical Exam: Blood pressure 138/80, pulse (!) 112, temperature (!) 97.5 F (36.4 C), resp. rate 14, height 6\' 1"  (1.854 m), weight 235 lb (106.6 kg), SpO2 100 %. General: alert, well-developed, and cooperative to examination.  Head: normocephalic and atraumatic.  Eyes: vision grossly intact, pupils equal, pupils round, pupils reactive to light, no injection and anicteric.  Mouth: pharynx pink and moist, no erythema, and no exudates.  Neck: supple, full ROM, no thyromegaly, no JVD, and no carotid bruits.  Lungs: normal respiratory effort, no accessory muscle use, normal breath sounds, no crackles, and no wheezes. Heart: Tachycardic, irregularly irregular, no murmur, no gallop, and no rub.  Abdomen: soft, non-tender, normal bowel sounds, no distention, no guarding, no rebound tenderness.  Msk: no joint swelling, no joint warmth, and no redness over joints.    Pulses: 2+ DP/PT pulses bilaterally Extremities: No cyanosis, clubbing, edema Neurologic: alert & oriented X3, cranial nerves II-XII intact, strength normal in all extremities, sensation intact to light touch, normal heel to shin, mild dysmetria with finger to nose on the left, normal rapid alternating movements.  Reflexes 2+ throughout.  No aphasia or slurred speech. Skin: turgor normal and no rashes.  Psych: Normal mood and affect  EKG: personally reviewed my interpretation is atrial fibrillation   Assessment & Plan by Problem:  TIA: Patient with 2 episodes over the past 3 days with approximately 30-minute duration of dizziness, blurred vision, expressive aphasia.  He is without any acute neurologic findings on exam today aside from mild left finger to nose dysmetria.  He was noted to have new onset atrial fibrillation with RVR on arrival to the emergency department.  CT head done in the emergency department without any acute findings.  He is not noted any chest pain or palpitations.  It is likely that his TIAs are secondary to his new onset atrial fibrillation.  He does take aspirin daily at baseline.  Additionally, he does have several risk factors for stroke including hypertension, diabetes, family history of stroke in his father at age 55, hyperlipidemia.  Will start stroke workup with MRI/MRA head and neck, echocardiogram, lipid panel, A1c, PT/OT/speech.  Will start heparin with ischemic stroke protocol.  Continue aspirin 81 mg daily.  New onset atrial fibrillation: Chads vas score of 6.'s were initially elevated on arrival but have since been less than 110 most recent heart rate of 62.  Will monitor closely with telemetry.  Starting heparin.  Given his chronic kidney disease (unclear baseline as [facility] and no [facility] available currently) he may require warfarin at discharge.  Hypertension: He reports taking metoprolol 5 mg twice daily, losartan 100 mg daily, amlodipine 10  mg daily.  Blood pressure has been stable in the emergency department.  Will hold antihypertensives currently as he may need further rate control for his A. fib if he goes back into RVR.  Monitor closely.  Diabetes mellitus: He reports taking Humulin 10 units every morning as well as glipizide 20 mg daily.  He reports his last A1c was 6.5.  Start sliding scale insulin and monitor CBGs q. before meals nightly.  Rechecking A1c.  Chronic kidney disease stage III: Patient with known chronic kidney disease though no records available.  His creatinine on arrival today is 3.2.  Receives his care at the [place].  His wife reports that his kidney function is "30-40%."  Most likely has stage III kidney disease.  Will monitor with BMP in the morning.  Hyperlipidemia: Reports a history of hyperlipidemia however is no cholesterol medications on his med list.  Will start atorvastatin 80 mg nightly.  Dispo: Admit patient to Observation with expected length of stay less than 2 midnights.  Signed: Maryellen Pile, MD 06/16/2017, 9:24 AM  Pager: [phone]

## 2017-06-17 ENCOUNTER — Observation Stay (HOSPITAL_COMMUNITY): Payer: Medicare Other

## 2017-06-17 DIAGNOSIS — I639 Cerebral infarction, unspecified: Secondary | ICD-10-CM | POA: Diagnosis not present

## 2017-06-17 DIAGNOSIS — Z7982 Long term (current) use of aspirin: Secondary | ICD-10-CM

## 2017-06-17 DIAGNOSIS — G459 Transient cerebral ischemic attack, unspecified: Principal | ICD-10-CM

## 2017-06-17 DIAGNOSIS — Z8679 Personal history of other diseases of the circulatory system: Secondary | ICD-10-CM

## 2017-06-17 DIAGNOSIS — E785 Hyperlipidemia, unspecified: Secondary | ICD-10-CM | POA: Diagnosis not present

## 2017-06-17 DIAGNOSIS — N183 Chronic kidney disease, stage 3 (moderate): Secondary | ICD-10-CM | POA: Diagnosis not present

## 2017-06-17 DIAGNOSIS — I10 Essential (primary) hypertension: Secondary | ICD-10-CM

## 2017-06-17 DIAGNOSIS — I480 Paroxysmal atrial fibrillation: Secondary | ICD-10-CM | POA: Diagnosis not present

## 2017-06-17 DIAGNOSIS — I129 Hypertensive chronic kidney disease with stage 1 through stage 4 chronic kidney disease, or unspecified chronic kidney disease: Secondary | ICD-10-CM

## 2017-06-17 DIAGNOSIS — E1122 Type 2 diabetes mellitus with diabetic chronic kidney disease: Secondary | ICD-10-CM

## 2017-06-17 DIAGNOSIS — I4891 Unspecified atrial fibrillation: Secondary | ICD-10-CM

## 2017-06-17 DIAGNOSIS — Z79899 Other long term (current) drug therapy: Secondary | ICD-10-CM

## 2017-06-17 DIAGNOSIS — Z794 Long term (current) use of insulin: Secondary | ICD-10-CM

## 2017-06-17 DIAGNOSIS — E119 Type 2 diabetes mellitus without complications: Secondary | ICD-10-CM

## 2017-06-17 LAB — BASIC METABOLIC PANEL
Anion gap: 11 (ref 5–15)
BUN: 40 mg/dL — AB (ref 6–20)
CALCIUM: 8.6 mg/dL — AB (ref 8.9–10.3)
CO2: 22 mmol/L (ref 22–32)
CREATININE: 2.62 mg/dL — AB (ref 0.61–1.24)
Chloride: 107 mmol/L (ref 101–111)
GFR calc Af Amer: 25 mL/min — ABNORMAL LOW (ref >60)
GFR calc non Af Amer: 22 mL/min — ABNORMAL LOW (ref >60)
Glucose, Bld: 143 mg/dL — ABNORMAL HIGH (ref 65–99)
Potassium: 4.5 mmol/L (ref 3.5–5.1)
Sodium: 140 mmol/L (ref 135–145)

## 2017-06-17 LAB — GLUCOSE, CAPILLARY
GLUCOSE-CAPILLARY: 158 mg/dL — AB (ref 65–99)
GLUCOSE-CAPILLARY: 160 mg/dL — AB (ref 65–99)
Glucose-Capillary: 144 mg/dL — ABNORMAL HIGH (ref 65–99)
Glucose-Capillary: 147 mg/dL — ABNORMAL HIGH (ref 65–99)

## 2017-06-17 LAB — CBC
HCT: 34.6 % — ABNORMAL LOW (ref 39.0–52.0)
HEMOGLOBIN: 11.4 g/dL — AB (ref 13.0–17.0)
MCH: 27 pg (ref 26.0–34.0)
MCHC: 32.9 g/dL (ref 30.0–36.0)
MCV: 82 fL (ref 78.0–100.0)
Platelets: 218 10*3/uL (ref 150–400)
RBC: 4.22 MIL/uL (ref 4.22–5.81)
RDW: 14.1 % (ref 11.5–15.5)
WBC: 5.1 10*3/uL (ref 4.0–10.5)

## 2017-06-17 LAB — TSH: TSH: 2.976 u[IU]/mL (ref 0.350–4.500)

## 2017-06-17 LAB — TROPONIN I: Troponin I: 0.04 ng/mL (ref <0.03)

## 2017-06-17 LAB — HEPARIN LEVEL (UNFRACTIONATED)
Heparin Unfractionated: 0.75 IU/mL — ABNORMAL HIGH (ref 0.30–0.70)
Heparin Unfractionated: 0.79 IU/mL — ABNORMAL HIGH (ref 0.30–0.70)

## 2017-06-17 MED ORDER — METOPROLOL TARTRATE 25 MG PO TABS
25.0000 mg | ORAL_TABLET | Freq: Two times a day (BID) | ORAL | Status: DC
  Administered 2017-06-17 (×2): 25 mg via ORAL
  Filled 2017-06-17 (×2): qty 1

## 2017-06-17 NOTE — Progress Notes (Signed)
Received elevated troponin of 0.04, paged on call dr.

## 2017-06-17 NOTE — Progress Notes (Addendum)
STROKE TEAM PROGRESS NOTE   SUBJECTIVE (INTERVAL HISTORY) His wife of 32 years is at the bedside.  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  Patient with word findng difficulty, chest tightness, blurry vision and dizziness that occurred while walking the track at the gym on Monday. Resolved after 30 mins. Recurred on Wed, again resolved. Found to have new AF. Started on IV heparin. MRI without stroke. No additional speech episodes.   Attending team arrived during rounds. Discussed neuro status and plan with them.   OBJECTIVE Vitals:   06/16/17 2041 06/17/17 0022 06/17/17 0406 06/17/17 0753  BP: (!) 127/56 (!) 158/87 102/70 (!) 114/93  Pulse: (!) 58 (!) 58 71 100  Resp: 16 16 16 18   Temp: 98 F (36.7 C) 98.2 F (36.8 C) 98 F (36.7 C) 98.2 F (36.8 C)  TempSrc: Oral Oral Oral Oral  SpO2: 98% 100% 100% 100%  Weight:      Height:        CBC:  Recent Labs  Lab 06/15/17 2153 06/15/17 2201  WBC 5.2  --   NEUTROABS 2.4  --   HGB 12.8* 13.9  HCT 40.5 41.0  MCV 82.2  --   PLT 261  --     Basic Metabolic Panel:  Recent Labs  Lab 06/16/17 0724 06/17/17 0410  NA 137 140  K 4.4 4.5  CL 103 107  CO2 24 22  GLUCOSE 159* 143*  BUN 44* 40*  CREATININE 3.27* 2.62*  CALCIUM 8.8* 8.6*    Lipid Panel:     Component Value Date/Time   CHOL 320 (H) 06/16/2017 0724   TRIG 280 (H) 06/16/2017 0724   HDL 57 06/16/2017 0724   CHOLHDL 5.6 06/16/2017 0724   VLDL 56 (H) 06/16/2017 0724   LDLCALC 207 (H) 06/16/2017 0724   HgbA1c:  Lab Results  Component Value Date   HGBA1C 7.6 (H) 06/15/2017   Urine Drug Screen: No results found for: LABOPIA, COCAINSCRNUR, LABBENZ, AMPHETMU, THCU, LABBARB  Alcohol Level No results found for: ETH  IMAGING  Ct Head Wo Contrast  Result Date: 06/15/2017 CLINICAL DATA:  Acute onset of dizziness and aphasia. Transient ischemic attack. EXAM: CT HEAD WITHOUT CONTRAST TECHNIQUE: Contiguous axial images were obtained from the  base of the skull through the vertex without intravenous contrast. COMPARISON:  None. FINDINGS: Brain: No evidence of acute infarction, hemorrhage, hydrocephalus, extra-axial collection or mass lesion / mass effect. Prominence of the sulci suggests mild cortical volume loss. The brainstem and fourth ventricle are within normal limits. The basal ganglia are unremarkable in appearance. The cerebral hemispheres demonstrate grossly normal gray-white differentiation. No mass effect or midline shift is seen. Vascular: No hyperdense vessel or unexpected calcification. Skull: There is no evidence of fracture; visualized osseous structures are unremarkable in appearance. Sinuses/Orbits: The orbits are within normal limits. The paranasal sinuses and mastoid air cells are well-aerated. Other: No significant soft tissue abnormalities are seen. IMPRESSION: 1. No acute intracranial pathology seen on CT. 2. Mild cortical volume loss. Electronically Signed   By: Garald Balding M.D.   On: 06/15/2017 22:58   Mr Angiogram Head Wo Contrast  Result Date: 06/16/2017 CLINICAL DATA:  77 year old male with dizziness, blurred vision, abnormal speech. The examination had to be discontinued prior to completion; a neck MRA was planned but could not be obtained due to patient motion. EXAM: MRI HEAD WITHOUT CONTRAST MRA HEAD WITHOUT CONTRAST TECHNIQUE: Multiplanar, multiecho pulse sequences of the brain and surrounding  structures were obtained without intravenous contrast. Angiographic images of the head were obtained using MRA technique without contrast. COMPARISON:  Head CT without contrast 06/15/2017. FINDINGS: MRI HEAD FINDINGS Study is intermittently degraded by motion artifact despite repeated imaging attempts. Coronal T2 weighted imaging was not obtained. Brain: No restricted diffusion to suggest acute infarction. No midline shift, mass effect, evidence of mass lesion, ventriculomegaly, extra-axial collection or acute intracranial  hemorrhage. Cervicomedullary junction and pituitary are within normal limits. There is minimal for age nonspecific cerebral white matter T2 and FLAIR hyperintensity. No cortical encephalomalacia or chronic cerebral blood products identified. The deep gray matter nuclei, brainstem, and cerebellum are unremarkable. Vascular: Major intracranial vascular flow voids are preserved. Skull and upper cervical spine: Negative visible cervical spine. Normal bone marrow signal. Sinuses/Orbits: Postoperative changes to both globes, otherwise negative orbits soft tissues. Paranasal sinuses and mastoids are stable and well pneumatized. Other: Grossly normal Visible internal auditory structures. Scalp and face soft tissues appear negative. MRA HEAD FINDINGS Mildly motion degraded. Antegrade flow in the posterior circulation with dominant distal right vertebral artery. Both vertebral arteries are patent to the junction with the basilar. The left AICA appears patent while the right AICA appears dominant. No basilar stenosis. Patent SCA and PCA origins. The bilateral PCA branches appear patent although branch detail is limited by motion. Antegrade flow in both ICA siphons. Patent carotid termini. No definite siphon stenosis. Both MCA origins, M1 segments, and MCA bifurcations appear to be patent, although further detail is limited by motion. The right ACA A1 segment might be Dominic. Both proximal A2 segments appear to be patent. IMPRESSION: 1. Intermittently motion degraded despite repeated imaging attempts. 2. No evidence of acute infarct. The brain parenchyma appears grossly normal for age. 3. Intracranial MRA appears negative for large vessel occlusion, but further detail is limited. Electronically Signed   By: Genevie Ann M.D.   On: 06/16/2017 11:27   Mr Brain Wo Contrast  Result Date: 06/16/2017 CLINICAL DATA:  77 year old male with dizziness, blurred vision, abnormal speech. The examination had to be discontinued prior to  completion; a neck MRA was planned but could not be obtained due to patient motion. EXAM: MRI HEAD WITHOUT CONTRAST MRA HEAD WITHOUT CONTRAST TECHNIQUE: Multiplanar, multiecho pulse sequences of the brain and surrounding structures were obtained without intravenous contrast. Angiographic images of the head were obtained using MRA technique without contrast. COMPARISON:  Head CT without contrast 06/15/2017. FINDINGS: MRI HEAD FINDINGS Study is intermittently degraded by motion artifact despite repeated imaging attempts. Coronal T2 weighted imaging was not obtained. Brain: No restricted diffusion to suggest acute infarction. No midline shift, mass effect, evidence of mass lesion, ventriculomegaly, extra-axial collection or acute intracranial hemorrhage. Cervicomedullary junction and pituitary are within normal limits. There is minimal for age nonspecific cerebral white matter T2 and FLAIR hyperintensity. No cortical encephalomalacia or chronic cerebral blood products identified. The deep gray matter nuclei, brainstem, and cerebellum are unremarkable. Vascular: Major intracranial vascular flow voids are preserved. Skull and upper cervical spine: Negative visible cervical spine. Normal bone marrow signal. Sinuses/Orbits: Postoperative changes to both globes, otherwise negative orbits soft tissues. Paranasal sinuses and mastoids are stable and well pneumatized. Other: Grossly normal Visible internal auditory structures. Scalp and face soft tissues appear negative. MRA HEAD FINDINGS Mildly motion degraded. Antegrade flow in the posterior circulation with dominant distal right vertebral artery. Both vertebral arteries are patent to the junction with the basilar. The left AICA appears patent while the right AICA appears dominant. No basilar stenosis.  Patent SCA and PCA origins. The bilateral PCA branches appear patent although branch detail is limited by motion. Antegrade flow in both ICA siphons. Patent carotid termini. No  definite siphon stenosis. Both MCA origins, M1 segments, and MCA bifurcations appear to be patent, although further detail is limited by motion. The right ACA A1 segment might be Dominic. Both proximal A2 segments appear to be patent. IMPRESSION: 1. Intermittently motion degraded despite repeated imaging attempts. 2. No evidence of acute infarct. The brain parenchyma appears grossly normal for age. 3. Intracranial MRA appears negative for large vessel occlusion, but further detail is limited. Electronically Signed   By: Genevie Ann M.D.   On: 06/16/2017 11:27       PHYSICAL EXAM Patient alert and oriented x 3. Speech clear. No aphasia. No dysarthria. Extraoccular movements intact. Visual fields full. Face symmetric. Tongue midline. Moves all extremities x 4. Strength normal. Coordination normal. Sensation intact. Heart rate irregular irregular. Breath sounds clear. Gait deferred. NIHSS 0  ASSESSMENT/PLAN [name] is a 77 y.o. male with history of HTN, HD, CKD stage III, and DM presenting with 2 episodes of word finding difficulties.   L brain TIA in setting of new dx atrial fibrillation   Resultant  Neuro deficits resolved  CT head no acute abnormality  MRI head no acute stroke  MRA head no LVO (motion)  Carotid Doppler  pending   2D Echo  pending   LDL 207  HgbA1c 7.6  IV heparinfor VTE prophylaxis  Fall precautions  Diet Carb Modified Fluid consistency: Thin; Room service appropriate? Yes  aspirin 325 mg daily prior to admission, now on aspirin 81 mg daily and heparin IV. Recommend anticoagulation, defer agent choice to IM  Patient counseled to be compliant with his antithrombotic medications. He understands med choice based on CKD  Ongoing aggressive stroke risk factor management  Therapy recommendations:  No therapy needs  Disposition:  Home w/ wife  Atrial Fibrillation with RVR, new dx   Now on IV heparin and aspirin 81  Plan anticoagulation - considering  DOAC vs warfarin given CKD  Primary team monitoring rhythm, considering cardiology consult/cardioversion  Hypertension  Stable BP goal normotensive  Hyperlipidemia  Home meds:  none  LDL 207, goal < 70  Now on lipitor 80  Continue statin at discharge  Diabetes type II  HgbA1c 7.6, goal < 7.0  Uncontrolled  Other Stroke Risk Factors  Advanced age  Obesity, Body mass index is 31 kg/m., recommend weight loss, diet and exercise as appropriate   Other Active Problems  CKD stage III 2.62  Hospital day # [facility] See Amion for Pager information 06/17/2017 10:59 AM   ATTENDING NOTE: I reviewed above note and agree with the assessment and plan. I have made any additions or clarifications directly to the above note. Pt was seen and examined.   77 year old male with history of diabetes, hypertension, CKD, hyperlipidemia admitted for episode of word finding difficulties, lightheadedness and blurry vision.  During the episode BP low at 80/50s, heart rate high as high as 148.  ER patient was found to have A. fib RVR.  CT negative for acute stroke.  MRI brain no acute infarct, MRA head unremarkable.  Carotid Doppler unremarkable, 2D echo pending.  LDL 207 and A1c 7.6.  Patient stroke most likely due to new onset A. fib, however patient does have multiple stroke risk factors including diabetes, hypertension, hyperlipidemia.  Recommend Eliquis 5 mg  twice daily for stroke prevention, continue high-dose Lipitor.  Due to new onset A. fib, recommend cardiology consultation, regarding rate control and follow-up as outpatient.  Neurology will sign off. Please call with questions. Pt will follow up with stroke clinic NP at [facility] in about 4 weeks. Thanks for the consult.   Rosalin Hawking, MD PhD Stroke Neurology 06/17/2017 3:15 PM  To contact Stroke Continuity provider, please refer to [email or web address] After hours, contact General Neurology

## 2017-06-17 NOTE — Progress Notes (Signed)
Subjective:  No acute events overnight.  Patient reports doing well today and denies ongoing symptoms of dizziness, blurred vision, and expressive aphasia.  Discussed with patient awaiting ongoing stroke workup.  Also discussed need for anticoagulation therapy in the setting of new atrial fibrillation.  Wife present at bedside.  They verbalized understanding and are in agreement with plan.  All questions answered.  Objective:  Vital signs in last 24 hours: Vitals:   06/17/17 0022 06/17/17 0406 06/17/17 0753 06/17/17 1225  BP: (!) 158/87 102/70 (!) 114/93   Pulse: (!) 58 71 100   Resp: 16 16 18    Temp: 98.2 F (36.8 C) 98 F (36.7 C) 98.2 F (36.8 C) (P) 98.1 F (36.7 C)  TempSrc: Oral Oral Oral (P) Oral  SpO2: 100% 100% 100%   Weight:      Height:       Physical Exam  Constitutional: He is oriented to person, place, and time. He appears well-developed and well-nourished. No distress.  HENT:  Head: Normocephalic and atraumatic.  Mouth/Throat: No oropharyngeal exudate.  Eyes: Conjunctivae and EOM are normal. Pupils are equal, round, and reactive to light.  Neck: Normal range of motion. Neck supple.  Cardiovascular: Exam reveals no gallop and no friction rub.  No murmur heard. Irregularly irregular rhythm  Pulmonary/Chest: Effort normal and breath sounds normal. No respiratory distress. He has no wheezes. He has no rales.  Abdominal: Soft. Bowel sounds are normal. He exhibits no distension. There is no tenderness.  Neurological: He is alert and oriented to person, place, and time. He has normal strength. No cranial nerve deficit or sensory deficit.     Assessment/Plan:  Principal Problem:   TIA (transient ischemic attack) Active Problems:   Diabetes mellitus without complication (HCC)   Hypertension   Chronic kidney disease   Atrial fibrillation ([place])  # TIA: Patient presented after several transient episodes of dizziness, blurred vision, and expressive aphasia.   Negative head CT.  MRI brain/MRA negative for acute intracranial process or large vessel occlusion, though limited study by motion. His exam remains reassuring, with no focal deficits noted today.  Awaiting TTE to determine anticoagulation therapy. - Neurology following, appreciate recommendations - Continue ASA 81 mg daily - Continue atorvastatin 80 mg daily - Continue heparin GTT pending TTE - Follow-up TTE and carotid ultrasound  # New onset atrial fibrillation: Chads vas score of 6.'s Telemetry reviewed, Afib. HR 120-140s. Will resume home metoprolol and continue hepatin gtt.  - On telemetry  - Resume metoprolol 25 mg BID  - Continue heparin gtt pending TTE. May be a candidate for Eliquis.   # Hypertension: He reports taking metoprolol 25 mg twice daily, losartan 100 mg daily, amlodipine 10 mg daily.  Currently normotensive. - Will hold home antihypertensives   # Diabetes mellitus: He reports taking Humulin 10 units every morning as well as glipizide 20 mg daily.  He reports his last A1c was 6.5.  A1c 7.1 yesterday.  - SSI + CBGs monitoring   # Chronic kidney disease stage III: Unknown baseline, [facility] and on [facility] available. His creatinine on arrival today is 3.2. His wife reports that his kidney function is "30-40%."  Renal function improved, Cr 2.6.  - Will continue to monitor  # Hyperlipidemia: Reports a history of hyperlipidemia however is no cholesterol medications on his med list.  Will start atorvastatin 80 mg nightly. - Continue atorvastatin 80 mg QD    Dispo: Anticipated discharge in approximately 1-2 day(s).  Welford Roche, MD 06/17/2017, 12:34 PM Pager: [phone]

## 2017-06-17 NOTE — Evaluation (Signed)
Occupational Therapy Evaluation Patient Details Name: Todd [name] MRN: [id number] DOB: [birth year] Today's Date: 06/17/2017    History of Present Illness Pt is a 77 y/o male admitted secondary to dizziness, blurred vision and speech difficulties. Since admission, pt reports symptoms have resolved. CT and MRI negative for acute abnormality. PMH includes CKD, HTN, and DM.    Clinical Impression   This 77 y/o Mo presents with the above. At baseline pt is independent with ADLs and functional mobility. Pt completing room level functional mobility and standing ADLs with supervision throughout and only minimal LOB noted with pt able to self correct as needed. Pt noted to have HR up to 145 with room level activity. Pt overall presenting at his baseline regarding ADL completion; questions answered throughout. No further acute OT needs identified at this time. OT referral is appreicated, will sign off. Please re-consult if needs change.    Follow Up Recommendations  No OT follow up;Supervision - Intermittent    Equipment Recommendations  None recommended by OT           Precautions / Restrictions Precautions Precautions: None Restrictions Weight Bearing Restrictions: No      Mobility Bed Mobility Overal bed mobility: Modified Independent             General bed mobility comments: Increased time, however, no assist required.   Transfers Overall transfer level: Modified independent                    Balance Overall balance assessment: Mild deficits observed, not formally tested                                         ADL either performed or assessed with clinical judgement   ADL Overall ADL's : At baseline                                       General ADL Comments: Pt completing room level functional mobility, standing toileting and grooming ADLs with supervision throughout; pt appears to be at his baseline regarding ADL completion;  further mobility deferred this session as pt's HR noted to be up to 145 with room level activity (RN notified); pt with minimal balance deficits noted (slight LOB occasionally), with pt able to self correct without physical assist, therapist providing minguard for safety      Vision Baseline Vision/History: Wears glasses Wears Glasses: Reading only Patient Visual Report: No change from baseline Vision Assessment?: No apparent visual deficits                Pertinent Vitals/Pain Pain Assessment: No/denies pain     Hand Dominance Right   Extremity/Trunk Assessment Upper Extremity Assessment Upper Extremity Assessment: Overall WFL for tasks assessed   Lower Extremity Assessment Lower Extremity Assessment: Defer to PT evaluation   Cervical / Trunk Assessment Cervical / Trunk Assessment: Normal   Communication Communication Communication: No difficulties   Cognition Arousal/Alertness: Awake/alert Behavior During Therapy: WFL for tasks assessed/performed Overall Cognitive Status: Within Functional Limits for tasks assessed  Home Living Family/patient expects to be discharged to:: Private residence Living Arrangements: Spouse/significant other Available Help at Discharge: Family;Available 24 hours/day Type of Home: House Home Access: Stairs to enter   Entrance Stairs-Rails: Right;Left;Can reach both Home Layout: Two level Alternate Level Stairs-Number of Steps: flight  Alternate Level Stairs-Rails: Left Bathroom Shower/Tub: Teacher, early years/pre: Handicapped height     Home Equipment: None          Prior Functioning/Environment Level of Independence: Independent        Comments: Pt active and going to the [facility] for exercise.         OT Problem List: Impaired balance (sitting and/or standing);Decreased activity tolerance            OT Goals(Current goals can be found  in the care plan section) Acute Rehab OT Goals Patient Stated Goal: to go home  OT Goal Formulation: All assessment and education complete, DC therapy                                 AM-PAC PT "6 Clicks" Daily Activity     Outcome Measure Help from another person eating meals?: None Help from another person taking care of personal grooming?: None Help from another person toileting, which includes using toliet, bedpan, or urinal?: None Help from another person bathing (including washing, rinsing, drying)?: None Help from another person to put on and taking off regular upper body clothing?: None Help from another person to put on and taking off regular lower body clothing?: None 6 Click Score: 24   End of Session Nurse Communication: Mobility status  Activity Tolerance: Patient tolerated treatment well Patient left: in bed;with family/visitor present  OT Visit Diagnosis: Unsteadiness on feet (R26.81)                Time: 9163-8466 OT Time Calculation (min): 16 min Charges:  OT General Charges $OT Visit: 1 Visit OT Evaluation $OT Eval Low Complexity: 1 Low G-Codes:     Todd [name], OT Pager [phone] 06/17/2017   Todd [name] 06/17/2017, 11:32 AM

## 2017-06-17 NOTE — Progress Notes (Signed)
ANTICOAGULATION CONSULT NOTE - Follow Up Consult  Pharmacy Consult for Heparin Indication: Afib, TIA  Allergies  Allergen Reactions  . Lisinopril Cough  . Metformin And Related Other (See Comments)    chronic kidney disease    Patient Measurements: Height: 6\' 1"  (185.4 cm) Weight: 235 lb (106.6 kg) IBW/kg (Calculated) : 79.9 Heparin Dosing Weight:  102 kg  Vital Signs: Temp: 98.1 F (36.7 C) (03/15 1225) Temp Source: Oral (03/15 1225) BP: 130/81 (03/15 1225) Pulse Rate: 86 (03/15 1225)  Labs: Recent Labs    06/15/17 2153 06/15/17 2201 06/16/17 0724 06/16/17 1631 06/17/17 0401 06/17/17 0410 06/17/17 1428  HGB 12.8* 13.9  --   --   --   --  11.4*  HCT 40.5 41.0  --   --   --   --  34.6*  PLT 261  --   --   --   --   --  218  APTT 32  --   --   --   --   --   --   LABPROT 13.4  --   --   --   --   --   --   INR 1.03  --   --   --   --   --   --   HEPARINUNFRC  --   --   --  0.35 0.75*  --  0.79*  CREATININE 3.06* 3.20* 3.27*  --   --  2.62*  --   TROPONINI  --   --   --   --   --  0.04*  --     Estimated Creatinine Clearance: 29.8 mL/min (A) (by C-G formula based on SCr of 2.62 mg/dL (H)).  Assessment:  Anticoag: Neurology note says to Start heparin drip with ischemic stroke protocol. New afib. CT/MRI without stroke>>TIA. No anticoag PTA. Baseline CBC WNL. Hgb now 12.8>13.9>11.4 with platelets down 261>218. Watch closely. Initial heparin level = 0.35>0.75>0.79 (not clearing). Neuro recommends oral anticoagulation  Goal of Therapy:  Heparin level 0.3-0.5 units/ml Monitor platelets by anticoagulation protocol: Yes   Plan:  Decrease IV heparin to 850 units/hr. Recheck level in 8 hrs. Pending TTE prior to starting oral anticoagulation Daily HL and CBC  Amiylah Anastos S. Alford [place], PharmD, BCPS Clinical Staff Pharmacist Pager [phone]  Eilene Ghazi Stillinger 06/17/2017,3:11 PM

## 2017-06-17 NOTE — Progress Notes (Signed)
[facility] for Heparin Indication: CVA, afib  Allergies  Allergen Reactions  . Lisinopril Cough  . Metformin And Related Other (See Comments)    chronic kidney disease    Patient Measurements: Height: 6\' 1"  (185.4 cm) Weight: 235 lb (106.6 kg) IBW/kg (Calculated) : 79.9 Heparin Dosing Weight:  102 kg  Vital Signs: Temp: 98 F (36.7 C) (03/15 0406) Temp Source: Oral (03/15 0406) BP: 102/70 (03/15 0406) Pulse Rate: 71 (03/15 0406)  Labs: Recent Labs    06/15/17 2153 06/15/17 2201 06/16/17 0724 06/16/17 1631 06/17/17 0401  HGB 12.8* 13.9  --   --   --   HCT 40.5 41.0  --   --   --   PLT 261  --   --   --   --   APTT 32  --   --   --   --   LABPROT 13.4  --   --   --   --   INR 1.03  --   --   --   --   HEPARINUNFRC  --   --   --  0.35 0.75*  CREATININE 3.06* 3.20* 3.27*  --   --     Estimated Creatinine Clearance: 23.9 mL/min (A) (by C-G formula based on SCr of 3.27 mg/dL (H)).   Medical History: Past Medical History:  Diagnosis Date  . Asthma    history of asthma  . Chronic kidney disease   . Diabetes mellitus without complication ([place])   . GERD (gastroesophageal reflux disease)   . Hypertension     Assessment: CC/HPI: chest tightness, dizzyness, lightheadedness with blurred vision, 2 episodes of dysarthria with word finding difficulties. Noted to be in Afib in ED  PMH: hypertension, dyslipidemia, CKD stage III, diabetes mellitus, asthma,   Anticoag: Neurology note says to Start heparin drip with ischemic stroke protocol. New afib. No anticoag PTA. Baseline CBC WNL  3/15 AM update: heparin level elevated this AM, no issues per RN  Goal of Therapy:  Heparin level 0.3-0.5 units/ml Monitor platelets by anticoagulation protocol: Yes   Plan:  Dec heparin to 1100 units/hr [place], PharmD, BCPS Clinical Pharmacist Phone: [phone]

## 2017-06-17 NOTE — Progress Notes (Signed)
Carotid artery duplex has been completed. 1-39% ICA stenosis bilaterally.  06/17/17 9:36 AM Todd [name] RVT

## 2017-06-18 ENCOUNTER — Observation Stay (HOSPITAL_COMMUNITY): Payer: Medicare Other

## 2017-06-18 DIAGNOSIS — I493 Ventricular premature depolarization: Secondary | ICD-10-CM | POA: Diagnosis not present

## 2017-06-18 DIAGNOSIS — I4891 Unspecified atrial fibrillation: Secondary | ICD-10-CM | POA: Diagnosis not present

## 2017-06-18 DIAGNOSIS — N189 Chronic kidney disease, unspecified: Secondary | ICD-10-CM | POA: Diagnosis not present

## 2017-06-18 DIAGNOSIS — R451 Restlessness and agitation: Secondary | ICD-10-CM | POA: Diagnosis not present

## 2017-06-18 DIAGNOSIS — Z794 Long term (current) use of insulin: Secondary | ICD-10-CM | POA: Diagnosis not present

## 2017-06-18 DIAGNOSIS — E1122 Type 2 diabetes mellitus with diabetic chronic kidney disease: Secondary | ICD-10-CM | POA: Diagnosis present

## 2017-06-18 DIAGNOSIS — I129 Hypertensive chronic kidney disease with stage 1 through stage 4 chronic kidney disease, or unspecified chronic kidney disease: Secondary | ICD-10-CM | POA: Diagnosis present

## 2017-06-18 DIAGNOSIS — E669 Obesity, unspecified: Secondary | ICD-10-CM | POA: Diagnosis present

## 2017-06-18 DIAGNOSIS — Z6831 Body mass index (BMI) 31.0-31.9, adult: Secondary | ICD-10-CM | POA: Diagnosis not present

## 2017-06-18 DIAGNOSIS — N179 Acute kidney failure, unspecified: Secondary | ICD-10-CM | POA: Diagnosis present

## 2017-06-18 DIAGNOSIS — Z7982 Long term (current) use of aspirin: Secondary | ICD-10-CM | POA: Diagnosis not present

## 2017-06-18 DIAGNOSIS — Z79899 Other long term (current) drug therapy: Secondary | ICD-10-CM | POA: Diagnosis not present

## 2017-06-18 DIAGNOSIS — J45909 Unspecified asthma, uncomplicated: Secondary | ICD-10-CM | POA: Diagnosis present

## 2017-06-18 DIAGNOSIS — I480 Paroxysmal atrial fibrillation: Secondary | ICD-10-CM | POA: Diagnosis present

## 2017-06-18 DIAGNOSIS — R410 Disorientation, unspecified: Secondary | ICD-10-CM | POA: Diagnosis not present

## 2017-06-18 DIAGNOSIS — Z888 Allergy status to other drugs, medicaments and biological substances status: Secondary | ICD-10-CM | POA: Diagnosis not present

## 2017-06-18 DIAGNOSIS — Z7901 Long term (current) use of anticoagulants: Secondary | ICD-10-CM | POA: Diagnosis not present

## 2017-06-18 DIAGNOSIS — K219 Gastro-esophageal reflux disease without esophagitis: Secondary | ICD-10-CM | POA: Diagnosis present

## 2017-06-18 DIAGNOSIS — I4892 Unspecified atrial flutter: Secondary | ICD-10-CM | POA: Diagnosis present

## 2017-06-18 DIAGNOSIS — N183 Chronic kidney disease, stage 3 (moderate): Secondary | ICD-10-CM | POA: Diagnosis present

## 2017-06-18 DIAGNOSIS — E785 Hyperlipidemia, unspecified: Secondary | ICD-10-CM | POA: Diagnosis present

## 2017-06-18 DIAGNOSIS — G459 Transient cerebral ischemic attack, unspecified: Secondary | ICD-10-CM | POA: Diagnosis present

## 2017-06-18 LAB — GLUCOSE, CAPILLARY
GLUCOSE-CAPILLARY: 175 mg/dL — AB (ref 65–99)
Glucose-Capillary: 147 mg/dL — ABNORMAL HIGH (ref 65–99)
Glucose-Capillary: 149 mg/dL — ABNORMAL HIGH (ref 65–99)
Glucose-Capillary: 146 mg/dL — ABNORMAL HIGH (ref 65–99)

## 2017-06-18 LAB — HEPARIN LEVEL (UNFRACTIONATED)
HEPARIN UNFRACTIONATED: 0.41 [IU]/mL (ref 0.30–0.70)
Heparin Unfractionated: 0.36 IU/mL (ref 0.30–0.70)

## 2017-06-18 LAB — BASIC METABOLIC PANEL
Anion gap: 9 (ref 5–15)
BUN: 36 mg/dL — ABNORMAL HIGH (ref 6–20)
CALCIUM: 8.8 mg/dL — AB (ref 8.9–10.3)
CO2: 22 mmol/L (ref 22–32)
CREATININE: 2.25 mg/dL — AB (ref 0.61–1.24)
Chloride: 108 mmol/L (ref 101–111)
GFR calc Af Amer: 30 mL/min — ABNORMAL LOW (ref >60)
GFR calc non Af Amer: 26 mL/min — ABNORMAL LOW (ref >60)
GLUCOSE: 162 mg/dL — AB (ref 65–99)
Potassium: 4.7 mmol/L (ref 3.5–5.1)
Sodium: 139 mmol/L (ref 135–145)

## 2017-06-18 LAB — CBC
HEMATOCRIT: 34.3 % — AB (ref 39.0–52.0)
Hemoglobin: 10.9 g/dL — ABNORMAL LOW (ref 13.0–17.0)
MCH: 26.1 pg (ref 26.0–34.0)
MCHC: 31.8 g/dL (ref 30.0–36.0)
MCV: 82.1 fL (ref 78.0–100.0)
Platelets: 249 10*3/uL (ref 150–400)
RBC: 4.18 MIL/uL — ABNORMAL LOW (ref 4.22–5.81)
RDW: 14 % (ref 11.5–15.5)
WBC: 5.4 10*3/uL (ref 4.0–10.5)

## 2017-06-18 LAB — ECHOCARDIOGRAM COMPLETE

## 2017-06-18 MED ORDER — METOPROLOL TARTRATE 5 MG/5ML IV SOLN
5.0000 mg | Freq: Once | INTRAVENOUS | Status: AC
  Administered 2017-06-18: 5 mg via INTRAVENOUS
  Filled 2017-06-18: qty 5

## 2017-06-18 MED ORDER — DILTIAZEM HCL 100 MG IV SOLR
5.0000 mg/h | INTRAVENOUS | Status: DC
  Administered 2017-06-18: 5 mg/h via INTRAVENOUS
  Filled 2017-06-18: qty 100

## 2017-06-18 MED ORDER — AMLODIPINE BESYLATE 10 MG PO TABS
10.0000 mg | ORAL_TABLET | Freq: Every day | ORAL | Status: DC
  Administered 2017-06-18 – 2017-06-19 (×2): 10 mg via ORAL
  Filled 2017-06-18 (×2): qty 1

## 2017-06-18 MED ORDER — METOPROLOL TARTRATE 50 MG PO TABS
50.0000 mg | ORAL_TABLET | Freq: Two times a day (BID) | ORAL | Status: DC
  Administered 2017-06-18: 50 mg via ORAL
  Filled 2017-06-18: qty 1

## 2017-06-18 MED ORDER — METOPROLOL TARTRATE 5 MG/5ML IV SOLN
5.0000 mg | Freq: Once | INTRAVENOUS | Status: AC
  Administered 2017-06-18: 5 mg via INTRAVENOUS

## 2017-06-18 NOTE — Progress Notes (Signed)
[place] for Heparin Indication: CVA, afib  Allergies  Allergen Reactions  . Lisinopril Cough  . Metformin And Related Other (See Comments)    chronic kidney disease    Patient Measurements: Height: 6\' 1"  (185.4 cm) Weight: 235 lb (106.6 kg) IBW/kg (Calculated) : 79.9 Heparin Dosing Weight:  102 kg  Vital Signs: Temp: 98.6 F (37 C) (03/16 0748) Temp Source: Oral (03/16 0748) BP: 125/89 (03/16 0748) Pulse Rate: 126 (03/16 0748)  Labs: Recent Labs    06/15/17 2153 06/15/17 2201 06/16/17 0724  06/17/17 0410 06/17/17 1428 06/18/17 0023 06/18/17 0430  HGB 12.8* 13.9  --   --   --  11.4*  --  10.9*  HCT 40.5 41.0  --   --   --  34.6*  --  34.3*  PLT 261  --   --   --   --  218  --  249  APTT 32  --   --   --   --   --   --   --   LABPROT 13.4  --   --   --   --   --   --   --   INR 1.03  --   --   --   --   --   --   --   HEPARINUNFRC  --   --   --    < >  --  0.79* 0.41 0.36  CREATININE 3.06* 3.20* 3.27*  --  2.62*  --   --  2.25*  TROPONINI  --   --   --   --  0.04*  --   --   --    < > = values in this interval not displayed.    Estimated Creatinine Clearance: 34.7 mL/min (A) (by C-G formula based on SCr of 2.25 mg/dL (H)).   Assessment: 35 YOM on IV heparin for ischemic stroke protocol f/t new afib.  He heparin level = 0.36 this morning therapeutic x 2 on 850 units/hr  Goal of Therapy:  Heparin level 0.3-0.5 units/ml Monitor platelets by anticoagulation protocol: Yes   Plan:  Cont heparin at 850 units/hr F/u daily heparin level  F/u plans for anticoagulation.  Maryanna Shape, PharmD, BCPS  Clinical Pharmacist  Pager: [phone]

## 2017-06-18 NOTE — Progress Notes (Signed)
  Echocardiogram 2D Echocardiogram has been performed.  Todd [name] 06/18/2017, 2:18 PM

## 2017-06-18 NOTE — Care Management Obs Status (Signed)
[facility] NOTIFICATION   Patient Details  Name: Todd [name] MRN: [id number] Date of Birth: 05/21/1940   Medicare Observation Status Notification Given:  Yes    Claudie Leach, RN 06/18/2017, 10:17 AM

## 2017-06-18 NOTE — Progress Notes (Signed)
Paged on call about patient vital signs...blood pressure 172/93 with a pulse of 46.  No new orders given continue to monitor.

## 2017-06-18 NOTE — Progress Notes (Signed)
   Subjective:  Patient denies chest pain, palpitations, shortness of breath, headache, new neuro deficits or recurrence of aphasia. He has been walking around his room some last night w/o dizziness.  Objective:  Vital signs in last 24 hours: Vitals:   06/18/17 0032 06/18/17 0500 06/18/17 0748 06/18/17 1114  BP: 124/72 (!) 172/93 125/89 107/85  Pulse: (!) 57 (!) 46 (!) 126 (!) 125  Resp: 18 18 17 18   Temp: 97.7 F (36.5 C) 98.2 F (36.8 C) 98.6 F (37 C) 97.7 F (36.5 C)  TempSrc: Oral Oral Oral Oral  SpO2: 100% 100% 100% 100%  Weight:      Height:       Constitutional: NAD, sitting up in bed comfortably HEENT: no JVD CV: irregularly irregular rhythm, no M/R/G Resp: CTAB, no increased work of breathing Abd: soft, NDNT Neuro: no gross CN deficits, moves all 4 extremities freely  Assessment/Plan:  Principal Problem:   TIA (transient ischemic attack) Active Problems:   Diabetes mellitus without complication (HCC)   Hypertension   Chronic kidney disease   Atrial fibrillation (HCC)  TIA: No further episodes. 2/2 A fib with hypotension most likely. Neurology has signed off and patient will f/u with them outpatient. --f/u Echo --continue asa 81mg  daily --atorvastatin 80mg  daily --heparin gtt for now, until echo results available to decide on anticoagulation  New onset A fib: CHA2DS2VASC score of 6. Telemetry reviewed personally, with HR consistently 110-130s --increase home metoprolol to 50mg  BID --heparin ggt --f/u echo and based on results will decide on anticoagulation  HTN: BP stable today --metoprolol 50mg  BID, amlodipine 10mg  daily --hold home losartan in setting of normal BPs and resolving AKI  T2DM: Home regimen is Humulin 10units qAM and glipizide 20mg  daily. A1c 7.9 this admission. --SSI wc during admission; will resume home regimen at discharge with close f/u with PCP for further adjustment  AKI on CKD: Cr 3.2 on admission with prior value two years  ago at 1.8; his lasix has been held this admission due to presumed AKI and euvolemic status on exam. Cr is downtrending to 2.25 this morning. If he was volume down on admission, this could have contributed to his hypotension and RVR leading to TIAs. --will continue holding lasix until f/u with PCP with repeat BMet  --hold losartan until f/u  Dispo: Anticipated discharge in approximately 0-1 day(s).   Alphonzo Grieve, MD 06/18/2017, 2:01 PM IMTS - PGY2 Pager [phone]

## 2017-06-18 NOTE — Progress Notes (Signed)
Todd [name] for Heparin Indication: CVA, afib  Allergies  Allergen Reactions  . Lisinopril Cough  . Metformin And Related Other (See Comments)    chronic kidney disease    Patient Measurements: Height: 6\' 1"  (185.4 cm) Weight: 235 lb (106.6 kg) IBW/kg (Calculated) : 79.9 Heparin Dosing Weight:  102 kg  Vital Signs: Temp: 97.7 F (36.5 C) (03/16 0032) Temp Source: Oral (03/16 0032) BP: 124/72 (03/16 0032) Pulse Rate: 57 (03/16 0032)  Labs: Recent Labs    06/15/17 2153 06/15/17 2201 06/16/17 0724  06/17/17 0401 06/17/17 0410 06/17/17 1428 06/18/17 0023  HGB 12.8* 13.9  --   --   --   --  11.4*  --   HCT 40.5 41.0  --   --   --   --  34.6*  --   PLT 261  --   --   --   --   --  218  --   APTT 32  --   --   --   --   --   --   --   LABPROT 13.4  --   --   --   --   --   --   --   INR 1.03  --   --   --   --   --   --   --   HEPARINUNFRC  --   --   --    < > 0.75*  --  0.79* 0.41  CREATININE 3.06* 3.20* 3.27*  --   --  2.62*  --   --   TROPONINI  --   --   --   --   --  0.04*  --   --    < > = values in this interval not displayed.    Estimated Creatinine Clearance: 29.8 mL/min (A) (by C-G formula based on SCr of 2.62 mg/dL (H)).   Medical History: Past Medical History:  Diagnosis Date  . Asthma    history of asthma  . Chronic kidney disease   . Diabetes mellitus without complication ([place])   . GERD (gastroesophageal reflux disease)   . Hypertension     Assessment: CC/HPI: chest tightness, dizzyness, lightheadedness with blurred vision, 2 episodes of dysarthria with word finding difficulties. Noted to be in Afib in ED  PMH: hypertension, dyslipidemia, CKD stage III, diabetes mellitus, asthma,   Anticoag: Neurology note says to Start heparin drip with ischemic stroke protocol. New afib. No anticoag PTA. Baseline CBC WNL  3/16 AM update: heparin level therapeutic x 1 after rate decrease  Goal of Therapy:  Heparin level  0.3-0.5 units/ml Monitor platelets by anticoagulation protocol: Yes   Plan:  Cont heparin at 850 units/hr Confirmatory heparin level with AM labs  Narda Bonds, PharmD, [place] Pharmacist Phone: [phone]

## 2017-06-19 DIAGNOSIS — R410 Disorientation, unspecified: Secondary | ICD-10-CM

## 2017-06-19 DIAGNOSIS — R451 Restlessness and agitation: Secondary | ICD-10-CM

## 2017-06-19 LAB — GLUCOSE, CAPILLARY
GLUCOSE-CAPILLARY: 157 mg/dL — AB (ref 65–99)
Glucose-Capillary: 152 mg/dL — ABNORMAL HIGH (ref 65–99)
Glucose-Capillary: 157 mg/dL — ABNORMAL HIGH (ref 65–99)

## 2017-06-19 LAB — CBC
HEMATOCRIT: 34.3 % — AB (ref 39.0–52.0)
HEMOGLOBIN: 11 g/dL — AB (ref 13.0–17.0)
MCH: 26.3 pg (ref 26.0–34.0)
MCHC: 32.1 g/dL (ref 30.0–36.0)
MCV: 82.1 fL (ref 78.0–100.0)
Platelets: 231 10*3/uL (ref 150–400)
RBC: 4.18 MIL/uL — ABNORMAL LOW (ref 4.22–5.81)
RDW: 13.8 % (ref 11.5–15.5)
WBC: 5.9 10*3/uL (ref 4.0–10.5)

## 2017-06-19 LAB — HEPARIN LEVEL (UNFRACTIONATED)
Heparin Unfractionated: 0.24 IU/mL — ABNORMAL LOW (ref 0.30–0.70)
Heparin Unfractionated: <0.1 IU/mL — ABNORMAL LOW (ref 0.30–0.70)

## 2017-06-19 MED ORDER — METOPROLOL TARTRATE 25 MG PO TABS
25.0000 mg | ORAL_TABLET | Freq: Two times a day (BID) | ORAL | Status: DC
  Administered 2017-06-20: 25 mg via ORAL
  Filled 2017-06-19: qty 1

## 2017-06-19 MED ORDER — AMLODIPINE BESYLATE 5 MG PO TABS
5.0000 mg | ORAL_TABLET | Freq: Every day | ORAL | Status: DC
  Administered 2017-06-20: 5 mg via ORAL
  Filled 2017-06-19: qty 1

## 2017-06-19 MED ORDER — RISPERIDONE 0.5 MG PO TBDP
0.5000 mg | ORAL_TABLET | Freq: Once | ORAL | Status: DC | PRN
  Filled 2017-06-19 (×2): qty 1

## 2017-06-19 MED ORDER — HALOPERIDOL LACTATE 5 MG/ML IJ SOLN
INTRAMUSCULAR | Status: AC
  Administered 2017-06-19: 2 mg via INTRAVENOUS
  Filled 2017-06-19: qty 1

## 2017-06-19 MED ORDER — AMIODARONE HCL IN DEXTROSE 360-4.14 MG/200ML-% IV SOLN
60.0000 mg/h | INTRAVENOUS | Status: DC
  Filled 2017-06-19 (×2): qty 200

## 2017-06-19 MED ORDER — METOPROLOL TARTRATE 5 MG/5ML IV SOLN
5.0000 mg | Freq: Four times a day (QID) | INTRAVENOUS | Status: AC
  Administered 2017-06-19: 5 mg via INTRAVENOUS
  Filled 2017-06-19 (×2): qty 5

## 2017-06-19 MED ORDER — HALOPERIDOL LACTATE 5 MG/ML IJ SOLN
2.5000 mg | Freq: Four times a day (QID) | INTRAMUSCULAR | Status: DC | PRN
  Administered 2017-06-19 (×2): 5 mg via INTRAVENOUS
  Filled 2017-06-19 (×2): qty 1

## 2017-06-19 MED ORDER — METOPROLOL TARTRATE 25 MG PO TABS: 25.0000 mg | ORAL_TABLET | Freq: Two times a day (BID) | ORAL | Status: DC

## 2017-06-19 MED ORDER — HALOPERIDOL LACTATE 5 MG/ML IJ SOLN
2.0000 mg | Freq: Once | INTRAMUSCULAR | Status: AC | PRN
  Administered 2017-06-19: 2 mg via INTRAVENOUS

## 2017-06-19 MED ORDER — AMIODARONE HCL IN DEXTROSE 360-4.14 MG/200ML-% IV SOLN
30.0000 mg/h | INTRAVENOUS | Status: DC
  Administered 2017-06-19 (×2): 30 mg/h via INTRAVENOUS
  Filled 2017-06-19 (×5): qty 200

## 2017-06-19 MED ORDER — AMIODARONE LOAD VIA INFUSION
150.0000 mg | Freq: Once | INTRAVENOUS | Status: AC
  Administered 2017-06-19: 150 mg via INTRAVENOUS
  Filled 2017-06-19: qty 83.34

## 2017-06-19 NOTE — Progress Notes (Signed)
Internal Medicine Attending:   I saw and examined the patient. I reviewed the resident's note and I agree with the resident's findings and plan as documented in the resident's note.  Patient was seen this morning and had no new complaints at that time.  Nurse did state that the patient was acutely agitated prior to his rounding on him but that appeared to have resolved by the time we saw him.  He remained in A. fib with RVR despite amiodarone drip and oral metoprolol.  Cardiology follow-up and recommendations appreciated.  Metoprolol increased to 25 mg twice daily.  We will continue with amiodarone drip and heparin drip for now.  Patient will likely DCCV if he remains in A. fib until tomorrow.  Of note, we were recalled to see the patient given worsening agitation and patient wanting to leave.  Patient did not know where he was and was only oriented to time and person.  Dr. Jari Favre and I attempted to persuade the patient to stay.  Etiology behind his acute confusion remains uncertain.  Amiodarone can cause hallucinations but I am unsure if this is the etiology.  He has no signs or symptoms of underlying infection at this time.  He may have some mild underlying dementia and has become acutely delirious in the hospital secondary to his prolonged stay.  Haldol given for agitation.  We will continue to monitor closely.

## 2017-06-19 NOTE — Progress Notes (Signed)
Rapid Response RN visited pt and advised to increase rate of cardizem drip to 15 mg/hr and to monitor pt then notify on call MD's after 15 minutes.

## 2017-06-19 NOTE — Progress Notes (Signed)
Titrated Cardizem drip up to 10 mg/hr per MD order. BP 111/98, HR 126. No s/s pain or discomfort. Will continue to monitor.

## 2017-06-19 NOTE — Progress Notes (Signed)
Cardizem drip started at 2338 per MD order for HR in 130's. Pt has no c/o pain or discomfort at this time. Will continue to monitor.

## 2017-06-19 NOTE — Progress Notes (Addendum)
At 0008 pt's BP 101/84 and HR 133, MD notified. Cardizem drip continued at 16mL/hr. Pt has no c/o pain or discomfort at this time. Will continue monitoring

## 2017-06-19 NOTE — Progress Notes (Signed)
[facility] for Heparin Indication: CVA, afib  Allergies  Allergen Reactions  . Lisinopril Cough  . Metformin And Related Other (See Comments)    chronic kidney disease   Patient Measurements: Height: 6\' 1"  (185.4 cm) Weight: 235 lb (106.6 kg) IBW/kg (Calculated) : 79.9 Heparin Dosing Weight:  102 kg  Vital Signs: Temp: 98.6 F (37 C) (03/17 1121) Temp Source: Oral (03/17 1121) BP: 150/113 (03/17 1121) Pulse Rate: 112 (03/17 1121)  Labs: Recent Labs    06/17/17 0410  06/17/17 1428  06/18/17 0430 06/19/17 0508 06/19/17 1450  HGB  --    < > 11.4*  --  10.9* 11.0*  --   HCT  --   --  34.6*  --  34.3* 34.3*  --   PLT  --   --  218  --  249 231  --   HEPARINUNFRC  --   --  0.79*   < > 0.36 0.24* <0.10*  CREATININE 2.62*  --   --   --  2.25*  --   --   TROPONINI 0.04*  --   --   --   --   --   --    < > = values in this interval not displayed.   Estimated Creatinine Clearance: 34.7 mL/min (A) (by C-G formula based on SCr of 2.25 mg/dL (H)).  Medical History: Past Medical History:  Diagnosis Date  . Asthma    history of asthma  . Chronic kidney disease   . Diabetes mellitus without complication ([place])   . GERD (gastroesophageal reflux disease)   . Hypertension    Assessment: CC/HPI: chest tightness, dizzyness, lightheadedness with blurred vision, 2 episodes of dysarthria with word finding difficulties. Noted to be in Afib in ED  PMH: hypertension, dyslipidemia, CKD stage III, diabetes mellitus, asthma,   Anticoag: Neurology note says to Start heparin drip with ischemic stroke protocol. New afib. No anticoag PTA. Baseline CBC WNL. Heparin level currently subtherapeutic < 0.1, despite rate inc.  Called floor and spoke with RN, patient with some line issues and heparin not running for several hours.  It has now been restarted at 900 units/hr.  Will recheck heparin level to confirm therapeutic response.  Goal of Therapy:  Heparin  level 0.3-0.5 units/ml Monitor platelets by anticoagulation protocol: Yes   Plan Continue heparin to 900 units/hr Daily heparin level, CBC Monitor clinical course, s/sx bleeding  Rober Minion, PharmD., MS Clinical Pharmacist Pager:  [phone] Thank you for allowing pharmacy to be part of this patients care team. 06/19/2017 5:13 PM

## 2017-06-19 NOTE — Progress Notes (Signed)
[facility] for Heparin Indication: CVA, afib  Allergies  Allergen Reactions  . Lisinopril Cough  . Metformin And Related Other (See Comments)    chronic kidney disease    Patient Measurements: Height: 6\' 1"  (185.4 cm) Weight: 235 lb (106.6 kg) IBW/kg (Calculated) : 79.9 Heparin Dosing Weight:  102 kg  Vital Signs: Temp: 97.8 F (36.6 C) (03/17 0255) Temp Source: Oral (03/17 0255) BP: 133/93 (03/17 0544) Pulse Rate: 95 (03/17 0544)  Labs: Recent Labs    06/16/17 0724  06/17/17 0410  06/17/17 1428 06/18/17 0023 06/18/17 0430 06/19/17 0508  HGB  --   --   --    < > 11.4*  --  10.9* 11.0*  HCT  --   --   --   --  34.6*  --  34.3* 34.3*  PLT  --   --   --   --  218  --  249 231  HEPARINUNFRC  --    < >  --   --  0.79* 0.41 0.36 0.24*  CREATININE 3.27*  --  2.62*  --   --   --  2.25*  --   TROPONINI  --   --  0.04*  --   --   --   --   --    < > = values in this interval not displayed.    Estimated Creatinine Clearance: 34.7 mL/min (A) (by C-G formula based on SCr of 2.25 mg/dL (H)).   Medical History: Past Medical History:  Diagnosis Date  . Asthma    history of asthma  . Chronic kidney disease   . Diabetes mellitus without complication ([place])   . GERD (gastroesophageal reflux disease)   . Hypertension     Assessment: CC/HPI: chest tightness, dizzyness, lightheadedness with blurred vision, 2 episodes of dysarthria with word finding difficulties. Noted to be in Afib in ED  PMH: hypertension, dyslipidemia, CKD stage III, diabetes mellitus, asthma,   Anticoag: Neurology note says to Start heparin drip with ischemic stroke protocol. New afib. No anticoag PTA. Baseline CBC WNL. Heparin level currently subtherapeutic, 0.24  Goal of Therapy:  Heparin level 0.3-0.5 units/ml Monitor platelets by anticoagulation protocol: Yes   Plan:  Increase heparin to 900 units/hr 8 hour level Daily heparin level, CBC Monitor clinical  course, s/sx bleeding  Nida Boatman, PharmD PGY1 Acute Care Pharmacy Resident Pager: [phone] 06/19/2017 6:52 AM

## 2017-06-19 NOTE — Progress Notes (Addendum)
Received a call earlier about Cardizem drip being initiated. I came around 230 for a follow up, per RN and tele HR was still in the 130s, and SBP > 110. Patient is not symptomatic of HR, Cardizem increased to 15mg /hr and instructed RN to monitor BP.  Advised RN to wait about 15 minutes and then page primary service provider and inform that drip has been titrated to the max and that patient may need other interventions for HR control.   RN received orders Amiodarone infusion. Assisted RN in administering AMIO, patient tolerated bolus dose well, HR improved to 110s, SBP maintained in the 120-140s.   Call RRT if needed  Start Time 230 End Time 0400

## 2017-06-19 NOTE — Consult Note (Signed)
Reason for Consult:A. Fib with RVR Referring Physician:Teaching service  Todd [name] is an 77 y.o. male.  Todd [name] is 77 year old male with past medical history significant for hypertension, hyperlipidemia, diabetes mellitus, chronic kidney disease stage III, was admitted on 06/16/2017 because of recurrent episodes of dizziness and blurring of vision and dysarthria for last 3-4 days did not seek any medical attention initially and was noted to be in A. Fib with RVR patient was started on IV heparin and subsequently received IV Cardizem which was switched to IV amiodarone continues to be in A. Fib with moderate ventricular response. Cardiologic consultation is called for further management of A. Fib. Patient presently off the monitor and refuses to wear the monitor. Patient denies any complaints states feels fine and refusing on any treatment.  Past Medical History:  Diagnosis Date  . Asthma    history of asthma  . Chronic kidney disease   . Diabetes mellitus without complication ([place])   . GERD (gastroesophageal reflux disease)   . Hypertension     Past Surgical History:  Procedure Laterality Date  . ESOPHAGOGASTRODUODENOSCOPY N/A 10/17/2014   Procedure: ESOPHAGOGASTRODUODENOSCOPY (EGD);  Surgeon: Wilford Corner, MD;  Location: [facility] ENDOSCOPY;  Service: Endoscopy;  Laterality: N/A;  . NO PAST SURGERIES      Family History  Problem Relation Age of Onset  . Diabetes Mother   . Diabetes Father   . Stroke Father   . Diabetes Sister     Social History:  reports that  has never smoked. he has never used smokeless tobacco. He reports that he does not drink alcohol or use drugs.  Allergies:  Allergies  Allergen Reactions  . Lisinopril Cough  . Metformin And Related Other (See Comments)    chronic kidney disease    Medications: I have reviewed the patient's current medications.  Results for orders placed or performed during the hospital encounter of 06/16/17 (from the past 48  hour(s))  Heparin level (unfractionated)     Status: Abnormal   Collection Time: 06/17/17  2:28 PM  Result Value Ref Range   Heparin Unfractionated 0.79 (H) 0.30 - 0.70 IU/mL    Comment:        IF HEPARIN RESULTS ARE BELOW EXPECTED VALUES, AND PATIENT DOSAGE HAS BEEN CONFIRMED, SUGGEST FOLLOW UP TESTING OF ANTITHROMBIN III LEVELS. Performed at [facility], [place] [address], [place], [place] [address]   CBC     Status: Abnormal   Collection Time: 06/17/17  2:28 PM  Result Value Ref Range   WBC 5.1 4.0 - 10.5 K/uL   RBC 4.22 4.22 - 5.81 MIL/uL   Hemoglobin 11.4 (L) 13.0 - 17.0 g/dL   HCT 34.6 (L) 39.0 - 52.0 %   MCV 82.0 78.0 - 100.0 fL   MCH 27.0 26.0 - 34.0 pg   MCHC 32.9 30.0 - 36.0 g/dL   RDW 14.1 11.5 - 15.5 %   Platelets 218 150 - 400 K/uL    Comment: Performed at [facility], [place] [address], [place], [place] [address]  Glucose, capillary     Status: Abnormal   Collection Time: 06/17/17  4:50 PM  Result Value Ref Range   Glucose-Capillary 147 (H) 65 - 99 mg/dL  Glucose, capillary     Status: Abnormal   Collection Time: 06/17/17  9:41 PM  Result Value Ref Range   Glucose-Capillary 160 (H) 65 - 99 mg/dL   Comment 1 Notify RN    Comment 2 Document in Chart  Heparin level (unfractionated)     Status: None   Collection Time: 06/18/17 12:23 AM  Result Value Ref Range   Heparin Unfractionated 0.41 0.30 - 0.70 IU/mL    Comment:        IF HEPARIN RESULTS ARE BELOW EXPECTED VALUES, AND PATIENT DOSAGE HAS BEEN CONFIRMED, SUGGEST FOLLOW UP TESTING OF ANTITHROMBIN III LEVELS. Performed at [facility], [place] [address] [place], [place] [address]   Heparin level (unfractionated)     Status: None   Collection Time: 06/18/17  4:30 AM  Result Value Ref Range   Heparin Unfractionated 0.36 0.30 - 0.70 IU/mL    Comment:        IF HEPARIN RESULTS ARE BELOW EXPECTED VALUES, AND PATIENT DOSAGE HAS BEEN CONFIRMED, SUGGEST FOLLOW UP TESTING OF ANTITHROMBIN III  LEVELS. Performed at [facility], [place] [address] [place], [place] [address]   CBC     Status: Abnormal   Collection Time: 06/18/17  4:30 AM  Result Value Ref Range   WBC 5.4 4.0 - 10.5 K/uL   RBC 4.18 (L) 4.22 - 5.81 MIL/uL   Hemoglobin 10.9 (L) 13.0 - 17.0 g/dL   HCT 34.3 (L) 39.0 - 52.0 %   MCV 82.1 78.0 - 100.0 fL   MCH 26.1 26.0 - 34.0 pg   MCHC 31.8 30.0 - 36.0 g/dL   RDW 14.0 11.5 - 15.5 %   Platelets 249 150 - 400 K/uL    Comment: Performed at [facility], [place] [address] [place], [place] [address]  Basic metabolic panel     Status: Abnormal   Collection Time: 06/18/17  4:30 AM  Result Value Ref Range   Sodium 139 135 - 145 mmol/L   Potassium 4.7 3.5 - 5.1 mmol/L   Chloride 108 101 - 111 mmol/L   CO2 22 22 - 32 mmol/L   Glucose, Bld 162 (H) 65 - 99 mg/dL   BUN 36 (H) 6 - 20 mg/dL   Creatinine, Ser 2.25 (H) 0.61 - 1.24 mg/dL   Calcium 8.8 (L) 8.9 - 10.3 mg/dL   GFR calc non Af Amer 26 (L) >60 mL/min   GFR calc Af Amer 30 (L) >60 mL/min    Comment: (NOTE) The eGFR has been calculated using the CKD EPI equation. This calculation has not been validated in all clinical situations. eGFR's persistently <60 mL/min signify possible Chronic Kidney Disease.    Anion gap 9 5 - 15    Comment: Performed at [facility] [address] [place], [place] [address]  Glucose, capillary     Status: Abnormal   Collection Time: 06/18/17  6:28 AM  Result Value Ref Range   Glucose-Capillary 147 (H) 65 - 99 mg/dL   Comment 1 Notify RN    Comment 2 Document in Chart   Glucose, capillary     Status: Abnormal   Collection Time: 06/18/17 11:38 AM  Result Value Ref Range   Glucose-Capillary 175 (H) 65 - 99 mg/dL   Comment 1 Notify RN    Comment 2 Document in Chart   Glucose, capillary     Status: Abnormal   Collection Time: 06/18/17  4:40 PM  Result Value Ref Range   Glucose-Capillary 149 (H) 65 - 99 mg/dL   Comment 1 Notify RN    Comment 2 Document in Chart    Glucose, capillary     Status: Abnormal   Collection Time: 06/18/17  9:06 PM  Result Value  Ref Range   Glucose-Capillary 146 (H) 65 - 99 mg/dL   Comment 1 Notify RN    Comment 2 Document in Chart   Heparin level (unfractionated)     Status: Abnormal   Collection Time: 06/19/17  5:08 AM  Result Value Ref Range   Heparin Unfractionated 0.24 (L) 0.30 - 0.70 IU/mL    Comment:        IF HEPARIN RESULTS ARE BELOW EXPECTED VALUES, AND PATIENT DOSAGE HAS BEEN CONFIRMED, SUGGEST FOLLOW UP TESTING OF ANTITHROMBIN III LEVELS. Performed at [facility], [place] [address], [place], [place] [address]   CBC     Status: Abnormal   Collection Time: 06/19/17  5:08 AM  Result Value Ref Range   WBC 5.9 4.0 - 10.5 K/uL   RBC 4.18 (L) 4.22 - 5.81 MIL/uL   Hemoglobin 11.0 (L) 13.0 - 17.0 g/dL   HCT 34.3 (L) 39.0 - 52.0 %   MCV 82.1 78.0 - 100.0 fL   MCH 26.3 26.0 - 34.0 pg   MCHC 32.1 30.0 - 36.0 g/dL   RDW 13.8 11.5 - 15.5 %   Platelets 231 150 - 400 K/uL    Comment: Performed at [facility], [place] [address] [place], [place] [address]  Glucose, capillary     Status: Abnormal   Collection Time: 06/19/17  6:35 AM  Result Value Ref Range   Glucose-Capillary 152 (H) 65 - 99 mg/dL   Comment 1 Notify RN    Comment 2 Document in Chart     No results found.  Review of Systems  Unable to perform ROS: Other (patient uncooperative)   Blood pressure (!) 150/113, pulse (!) 112, temperature 98.6 F (37 C), temperature source Oral, resp. rate 14, height 6' 1"  (1.854 m), weight 106.6 kg (235 lb), SpO2 97 %. Physical Exam  Constitutional: He is oriented to person, place, and time.  HENT:  Head: Normocephalic and atraumatic.  Eyes: Conjunctivae are normal. Left eye exhibits no discharge. No scleral icterus.  Neck: Normal range of motion. Neck supple. No JVD present. No tracheal deviation present. No thyromegaly present.  Cardiovascular:  Irregularly irregular tachycardic 2/6 systolic murmur  noted  Respiratory: Effort normal and breath sounds normal. No respiratory distress. He has no wheezes. He has no rales.  GI: Soft. Bowel sounds are normal. He exhibits no distension. There is no tenderness. There is no rebound.  Musculoskeletal: He exhibits edema. He exhibits no tenderness or deformity.  Neurological: He is alert and oriented to person, place, and time.    Assessment/Plan: New-onset A. fith RVR Status post TIA Hypertension Diabetes mellitus Chronic kidney disease stage III Hyperlipidemia Plan Reduce amlodipine to 5 mg daily Start Lopressor 25 mg twice daily Continue IV amiodarone and heparin. Discussed briefly with his wife regarding TEE synchronized DC cardioversion if remains in A. Fib until tomorrow, she will discuss with her husband at present her husband refusing for any treatment.  Charolette Forward 06/19/2017, 12:19 PM

## 2017-06-19 NOTE — Progress Notes (Addendum)
Titrated cardizem at 0037 from starting rate (5mg /hr) up to 7.5 mg/hr per MD order. Pt is not in distress. Will continue to monitor.

## 2017-06-19 NOTE — Progress Notes (Signed)
   Subjective:  Patient denies chest pain, palpitations, shortness of breath, headache.  Yesterday and overnight, his A fib with RVR was refractory to IV metoprolol 5mg  x3, maxed out cardizem drip and amio drip.  Nursing alerted [place] prior to [place] rounding that patient was acutely agitated, taking off tele leads; by the time we had arrived, patient had calmed down and was sitting calmly on his bed with wife at bedside. He was in agreement with below plan for cards consult for management of his A fib with refractory RVR.  Objective:  Vital signs in last 24 hours: Vitals:   06/19/17 0544 06/19/17 0614 06/19/17 0644 06/19/17 0715  BP: (!) 133/93 (!) 119/109 (!) 156/97 (!) 135/93  Pulse: 95  82 (!) 33  Resp: 13  17 14   Temp:    97.9 F (36.6 C)  TempSrc:    Oral  SpO2: 95%  97% 98%  Weight:      Height:       Constitutional: NAD, sitting up in bed comfortably HEENT: no JVD CV: irregularly irregular rhythm, tachycardic, no M/R/G Resp: CTAB, no increased work of breathing Abd: soft, NDNT Neuro: no gross CN deficits, moves all 4 extremities freely  Assessment/Plan:  Principal Problem:   TIA (transient ischemic attack) Active Problems:   Diabetes mellitus without complication (HCC)   Hypertension   Chronic kidney disease   Atrial fibrillation ([place])  New onset A fib: CHA2DS2VASC score of 6. Yesterday and overnight, RVR was refractory to total 15mg  IV metoprolol, Cardizem drip at 51ml/hr, and amiodarone drip. Telemetry reviewed personally, with HR consistently 110-120s. Discussed with Dr. Terrence Dupont who will see patient today to assist with management; we may be able to cardiovert him. Echo yesterday showed mild MR w/o stenosis, mod LA enlargement. --continue amiodarone gtt --heparin ggt; at discharge as not valvular A fib can start Eliquis 5mg  BID  TIA: No further episodes. 2/2 A fib with hypotension most likely. Neurology has signed off and patient will f/u with them  outpatient. --continue asa 81mg  daily --atorvastatin 80mg  daily --heparin gtt for now; will switch to Eliquis 5mg  BID at discharge --f/u with Neuro in 4 wks  HTN: BP stable today --metoprolol 50mg  BID, amlodipine 10mg  daily --hold home losartan in setting of resolving AKI; can restart outpt after f/u  T2DM: Home regimen is Humulin 10units qAM and glipizide 20mg  daily. A1c 7.9 this admission. --SSI wc during admission; will resume home regimen at discharge with close f/u with PCP for further adjustment  AKI on CKD: Cr 3.2 on admission with prior value two years ago at 1.8; his lasix has been held this admission due to presumed AKI and euvolemic status on exam. Cr is downtrending to 2.25. If he was volume down on admission, this could have contributed to his hypotension and RVR leading to TIAs. --will continue holding lasix until f/u with PCP with repeat BMet  --hold losartan until f/u  Dispo: Anticipated discharge in approximately 0-1 day(s).   Alphonzo Grieve, MD 06/19/2017, 10:39 AM IMTS - PGY2 Pager [phone]

## 2017-06-20 DIAGNOSIS — N179 Acute kidney failure, unspecified: Secondary | ICD-10-CM

## 2017-06-20 DIAGNOSIS — Z888 Allergy status to other drugs, medicaments and biological substances status: Secondary | ICD-10-CM

## 2017-06-20 DIAGNOSIS — Z7901 Long term (current) use of anticoagulants: Secondary | ICD-10-CM

## 2017-06-20 DIAGNOSIS — N189 Chronic kidney disease, unspecified: Secondary | ICD-10-CM

## 2017-06-20 LAB — CBC
HCT: 32.8 % — ABNORMAL LOW (ref 39.0–52.0)
Hemoglobin: 10.5 g/dL — ABNORMAL LOW (ref 13.0–17.0)
MCH: 26.4 pg (ref 26.0–34.0)
MCHC: 32 g/dL (ref 30.0–36.0)
MCV: 82.4 fL (ref 78.0–100.0)
Platelets: 216 10*3/uL (ref 150–400)
RBC: 3.98 MIL/uL — AB (ref 4.22–5.81)
RDW: 14.1 % (ref 11.5–15.5)
WBC: 7.1 10*3/uL (ref 4.0–10.5)

## 2017-06-20 LAB — BASIC METABOLIC PANEL
Anion gap: 9 (ref 5–15)
BUN: 32 mg/dL — ABNORMAL HIGH (ref 6–20)
CO2: 23 mmol/L (ref 22–32)
Calcium: 8.6 mg/dL — ABNORMAL LOW (ref 8.9–10.3)
Chloride: 106 mmol/L (ref 101–111)
Creatinine, Ser: 2.72 mg/dL — ABNORMAL HIGH (ref 0.61–1.24)
GFR calc non Af Amer: 21 mL/min — ABNORMAL LOW (ref >60)
GFR, EST AFRICAN AMERICAN: 24 mL/min — AB (ref >60)
Glucose, Bld: 235 mg/dL — ABNORMAL HIGH (ref 65–99)
POTASSIUM: 4.1 mmol/L (ref 3.5–5.1)
SODIUM: 138 mmol/L (ref 135–145)

## 2017-06-20 LAB — GLUCOSE, CAPILLARY
GLUCOSE-CAPILLARY: 152 mg/dL — AB (ref 65–99)
GLUCOSE-CAPILLARY: 185 mg/dL — AB (ref 65–99)

## 2017-06-20 LAB — HEPARIN LEVEL (UNFRACTIONATED): HEPARIN UNFRACTIONATED: 0.21 [IU]/mL — AB (ref 0.30–0.70)

## 2017-06-20 MED ORDER — AMIODARONE HCL 200 MG PO TABS
200.0000 mg | ORAL_TABLET | Freq: Two times a day (BID) | ORAL | Status: DC
  Administered 2017-06-20: 200 mg via ORAL
  Filled 2017-06-20: qty 1

## 2017-06-20 MED ORDER — ATORVASTATIN CALCIUM 80 MG PO TABS: 80.0000 mg | ORAL_TABLET | Freq: Every day | ORAL | 0 refills | Status: AC

## 2017-06-20 MED ORDER — ASPIRIN 81 MG PO TBEC: 81.0000 mg | DELAYED_RELEASE_TABLET | Freq: Every day | ORAL | 0 refills | Status: DC

## 2017-06-20 MED ORDER — METOPROLOL TARTRATE 50 MG PO TABS: 50.0000 mg | ORAL_TABLET | Freq: Two times a day (BID) | ORAL | Status: DC

## 2017-06-20 MED ORDER — APIXABAN 5 MG PO TABS: 5.0000 mg | ORAL_TABLET | Freq: Two times a day (BID) | ORAL | 0 refills | Status: DC

## 2017-06-20 MED ORDER — METOPROLOL TARTRATE 50 MG PO TABS: 50.0000 mg | ORAL_TABLET | Freq: Two times a day (BID) | ORAL | 0 refills | Status: DC

## 2017-06-20 MED ORDER — AMIODARONE HCL 200 MG PO TABS: 200.0000 mg | ORAL_TABLET | Freq: Two times a day (BID) | ORAL | 0 refills | Status: DC

## 2017-06-20 MED ORDER — APIXABAN 5 MG PO TABS
5.0000 mg | ORAL_TABLET | Freq: Two times a day (BID) | ORAL | Status: DC
  Administered 2017-06-20: 5 mg via ORAL
  Filled 2017-06-20: qty 1

## 2017-06-20 NOTE — Care Management Note (Signed)
Case Management Note  Patient Details  Name: Todd [name] MRN: [id number] Date of Birth: 11-15-1940  Subjective/Objective:   New onset afib with RVR, s/p TIA                 Action/Plan: NCM spoke to pt and wife at bedside. Provided pt with an Eliquis 30 day free trial card. [facility] Will follow up on copay for Eliquis.    Expected Discharge Date:  06/20/17               Expected Discharge Plan:  Home/Self Care  In-House Referral:  NA  Discharge planning Services  CM Consult, Medication Assistance  Post Acute Care Choice:  NA Choice offered to:  NA  DME Arranged:  N/A DME Agency:  NA  HH Arranged:  NA HH Agency:  NA  Status of Service:  Completed, signed off  If discussed at [place] of Stay Meetings, dates discussed:    Additional Comments:  Erenest Rasher, RN 06/20/2017, 2:40 PM

## 2017-06-20 NOTE — Care Management Important Message (Signed)
Important Message  Patient Details  Name: Todd [name] MRN: [id number] Date of Birth: 1940-11-23   Medicare Important Message Given:  Yes    Erenest Rasher, RN 06/20/2017, 2:39 PM

## 2017-06-20 NOTE — Progress Notes (Signed)
Subjective:  No acute events overnight. Telemetry shows NSR since 0900. Patient appears upset this morning and minimally interactive with staff. States he does not want to be here. He is otherwise doing well and denies chest pain, shortness of breath, abdominal pain, N/V, changes in vision, dizziness, and focal weakness. Discussed with patient new medications changes and importance of follow-up with cardiology as an outpatient.  Wife present at bedside.  They both verbalized understanding and are in agreement with plan.  All questions answered.  Objective:  Vital signs in last 24 hours: Vitals:   06/19/17 1121 06/19/17 2027 06/19/17 2342 06/20/17 0313  BP: (!) 150/113 (!) 134/95 132/67 118/72  Pulse: (!) 112  (!) 18   Resp:   19 13  Temp: 98.6 F (37 C) (!) 97.4 F (36.3 C) 98 F (36.7 C)   TempSrc: Oral Oral Oral   SpO2: 97% 100% 100%   Weight:      Height:       Physical Exam  Constitutional: He is oriented to person, place, and time. He appears well-developed and well-nourished. No distress.  Patient appears upset and is not very talkative this morning   Cardiovascular: Normal rate, regular rhythm and normal heart sounds. Exam reveals no gallop and no friction rub.  No murmur heard. Pulmonary/Chest: Effort normal and breath sounds normal. No respiratory distress. He has no wheezes. He has no rales.  Abdominal: Soft. Bowel sounds are normal. He exhibits no distension. There is no tenderness.  Musculoskeletal: Normal range of motion.  Neurological: He is alert and oriented to person, place, and time. He has normal strength. No cranial nerve deficit or sensory deficit.    Assessment/Plan:  Principal Problem:   TIA (transient ischemic attack) Active Problems:   Diabetes mellitus without complication (HCC)   Hypertension   Chronic kidney disease   Atrial fibrillation ([place])  # New onset A fib: CHA2DS2VASC score of 6. Patient was on amiodarone drip overnight and received 1  dose of IV metoprolol with conversion to NSR.  HR remained controlled this morning.  Remains asymptomatic and hemodynamically stable.  Per cardiology recommendations we will switch to p.o. amiodarone and increase metoprolol dose as below. - Cardiology, appreciate recommendations - Discontinue amiodarone drip.  We will switch to p.o. amiodarone 200 mg BID  - Increase metoprolol from 25 mg to 50 mg BID  - Start eliquis 5 mg BID  - Discontinue heparin gtt after first dose of Eliquis   # TIA: No further episodes. Secondary to A fib with hypotension most likely. Neurology has signed off and patient will f/u with them outpatient. - Start Eliquis 5 mg BID  - Discontinue heparin gtt after first dose of Eliquis  - Continue ASA 81mg  daily - Atorvastatin 80mg  daily - F/u with Neuro in 4 wks  # HTN: BP stable today - Metoprolol 50mg  BID, amlodipine 10mg  daily - Hold home losartan in setting of resolving AKI; can restart outpt after f/u  # T2DM: Home regimen is Humulin 10units qAM and glipizide 20mg  daily. A1c 7.9 this admission. - SSI wc during admission - Will resume home regimen at discharge with close f/u with PCP for further adjustment  # AKI on CKD: Cr 3.2 on admission with prior value two years ago at 1.8; his lasix has been held this admission due to presumed AKI and euvolemic status on exam. Cr trending up, 2.7 today. Will continue to monitor and manage as below. - Will continue holding lasix until f/u with  PCP with repeat BMP - Hold losartan until f/u   Dispo: Anticipated discharge in approximately today-1 day(s).   Welford Roche, MD 06/20/2017, 7:57 AM Pager: [phone]

## 2017-06-20 NOTE — Progress Notes (Signed)
[facility] for Heparin Indication: CVA, afib  Allergies  Allergen Reactions  . Lisinopril Cough  . Metformin And Related Other (See Comments)    chronic kidney disease   Patient Measurements: Height: 6\' 1"  (185.4 cm) Weight: 235 lb (106.6 kg) IBW/kg (Calculated) : 79.9 Heparin Dosing Weight:  102 kg  Vital Signs: Temp: 98 F (36.7 C) (03/18 0841) Temp Source: Oral (03/18 0841) BP: 151/84 (03/18 0900) Pulse Rate: 74 (03/18 0900)  Labs: Recent Labs    06/18/17 0430 06/19/17 0508 06/19/17 1450 06/20/17 0915  HGB 10.9* 11.0*  --  10.5*  HCT 34.3* 34.3*  --  32.8*  PLT 249 231  --  216  HEPARINUNFRC 0.36 0.24* <0.10* 0.21*  CREATININE 2.25*  --   --  2.72*   Estimated Creatinine Clearance: 28.7 mL/min (A) (by C-G formula based on SCr of 2.72 mg/dL (H)).  Medical History: Past Medical History:  Diagnosis Date  . Asthma    history of asthma  . Chronic kidney disease   . Diabetes mellitus without complication ([place])   . GERD (gastroesophageal reflux disease)   . Hypertension    Assessment: 77 year old male continues on heparin for Afib/CVA Heparin level low this AM at 0.21 CBC stable  Goal of Therapy:  Heparin level 0.3-0.5 units/ml Monitor platelets by anticoagulation protocol: Yes   Plan Increase heparin to 1100 units / hr Daily heparin level, CBC Monitor clinical course, s/sx bleeding  Follow up for change to po anti-coagulation -- Eliquis?  Thank you Anette Guarneri, PharmD [phone] 06/20/2017 10:57 AM

## 2017-06-20 NOTE — Progress Notes (Signed)
Subjective:  Bone, and cooperative today.  Denies any chest pain or shortness of breath, converted to sinus rhythm.  Frequent PVCs on the monitor, noted  Objective:  Vital Signs in the last 24 hours: Temp:  [97.4 F (36.3 C)-98.6 F (37 C)] 98 F (36.7 C) (03/18 0841) Pulse Rate:  [18-112] 74 (03/18 0900) Resp:  [12-19] 12 (03/18 0841) BP: (118-151)/(67-113) 151/84 (03/18 0900) SpO2:  [97 %-100 %] 100 % (03/18 0841)  Intake/Output from previous day: 03/17 0701 - 03/18 0700 In: 581.5 [I.V.:581.5] Out: 375 [Urine:375] Intake/Output from this shift: No intake/output data recorded.  Physical Exam: Neck: no adenopathy, no carotid bruit, no JVD and supple, symmetrical, trachea midline Lungs: clear to auscultation bilaterally Heart: regular rate and rhythm, S1, S2 normal and soft systolic murmur noted Abdomen: soft, non-tender; bowel sounds normal; no masses,  no organomegaly Extremities: extremities normal, atraumatic, no cyanosis or edema  Lab Results: Recent Labs    06/19/17 0508 06/20/17 0915  WBC 5.9 7.1  HGB 11.0* 10.5*  PLT 231 216   Recent Labs    06/18/17 0430  NA 139  K 4.7  CL 108  CO2 22  GLUCOSE 162*  BUN 36*  CREATININE 2.25*   No results for input(s): TROPONINI in the last 72 hours.  Invalid input(s): CK, MB Hepatic Function Panel No results for input(s): PROT, ALBUMIN, AST, ALT, ALKPHOS, BILITOT, BILIDIR, IBILI in the last 72 hours. No results for input(s): CHOL in the last 72 hours. No results for input(s): PROTIME in the last 72 hours.  Imaging: Imaging results have been reviewed and No results found.  Cardiac Studies: Status post New-onset A. fith RVR Chadsvasc score of 5 Status post TIA Hypertension Diabetes mellitus Chronic kidney disease stage III Hyperlipidemia Plan Increase Lopressor to 50 mg by mouth twice daily. Switch IV amiodarone to 200 mg by mouth twice a day Okay to switch heparin to oral anticoagulants.patient will need  chronic anticoagulation. Will reduce amiodarone as outpatient as appropriate   Assessment/Plan:    LOS: 2 days    Charolette Forward 06/20/2017, 10:10 AM

## 2017-06-20 NOTE — Discharge Summary (Signed)
Name: Todd [name] MRN: [id number] DOB: 05-09-1940 77 y.o. PCP: Charlsie Merles, MD  Date of Admission: 06/16/2017  6:16 AM Date of Discharge: 06/20/2017 Attending Physician: Dr. Joni Reining   Discharge Diagnosis: 1. TIA 2. Atrial fibrillation with RVR 3. AKI on CKD  4. HTN  5. T2DM   Principal Problem:   TIA (transient ischemic attack) Active Problems:   Diabetes mellitus without complication (HCC)   Hypertension   Chronic kidney disease   Atrial fibrillation [facility])   Discharge Medications: Allergies as of 06/20/2017      Reactions   Lisinopril Cough   Metformin And Related Other (See Comments)   chronic kidney disease      Medication List    STOP taking these medications   aspirin 325 MG tablet Replaced by:  aspirin 81 MG EC tablet   furosemide 20 MG tablet Commonly known as:  LASIX     TAKE these medications   albuterol 108 (90 Base) MCG/ACT inhaler Commonly known as:  PROVENTIL HFA;VENTOLIN HFA Inhale 2 puffs into the lungs every 6 (six) hours as needed for wheezing or shortness of breath.   amiodarone 200 MG tablet Commonly known as:  PACERONE Take 1 tablet (200 mg total) by mouth 2 (two) times daily.   amLODipine 10 MG tablet Commonly known as:  NORVASC Take 10 mg by mouth daily.   apixaban 5 MG Tabs tablet Commonly known as:  ELIQUIS Take 1 tablet (5 mg total) by mouth 2 (two) times daily.   aspirin 81 MG EC tablet Take 1 tablet (81 mg total) by mouth daily. Replaces:  aspirin 325 MG tablet   atorvastatin 80 MG tablet Commonly known as:  LIPITOR Take 1 tablet (80 mg total) by mouth daily at 6 PM.   doxazosin 4 MG tablet Commonly known as:  CARDURA Take 2 mg by mouth at bedtime.   ferrous sulfate 325 (65 FE) MG tablet Take 325 mg by mouth See admin instructions. Taking on Monday, Wednesday, Friday.   finasteride 5 MG tablet Commonly known as:  PROSCAR Take 5 mg by mouth daily.   fluticasone 50 MCG/ACT nasal spray Commonly known  as:  FLONASE Place 2 sprays into both nostrils daily as needed for allergies or rhinitis.   glipiZIDE 10 MG tablet Commonly known as:  GLUCOTROL Take 20 mg by mouth 2 (two) times daily before a meal.   insulin NPH Human 100 UNIT/ML injection Commonly known as:  HUMULIN N,NOVOLIN N Inject 10 Units into the skin every evening.   lactulose 10 GM/15ML solution Commonly known as:  CHRONULAC Take 10 g by mouth daily as needed for mild constipation or moderate constipation.   loratadine 10 MG tablet Commonly known as:  CLARITIN Take 10 mg by mouth daily as needed for allergies.   losartan 100 MG tablet Commonly known as:  COZAAR Take 100 mg by mouth daily.   metoprolol tartrate 50 MG tablet Commonly known as:  LOPRESSOR Take 1 tablet (50 mg total) by mouth 2 (two) times daily. What changed:  how much to take   omeprazole 20 MG capsule Commonly known as:  PRILOSEC Take 1 capsule by mouth 2 (two) times daily before a meal.   ondansetron 4 MG tablet Commonly known as:  ZOFRAN Take 1 tablet (4 mg total) by mouth every 6 (six) hours. What changed:    when to take this  reasons to take this   RA SALINE ENEMA RE Place 1 application rectally as needed (take  every other day as needed for constipation).   sennosides-docusate sodium 8.6-50 MG tablet Commonly known as:  SENOKOT-S Take 1 tablet by mouth 2 (two) times daily as needed for constipation.       Disposition and follow-up:   Todd [name] was discharged from [facility] in Stable condition.  At the hospital follow up visit please address:  1.  Please assess for new/ongoing neurological deficits. Please review patient's medication list with him and assess compliance with new medications (amiodarone and Eliquis). Please ensure patient has followed up with Cardiology and has scheduled an appointment with Neurology for follow up.   2.  Labs / imaging needed at time of follow-up: BMP to check renal function    3.  Pending labs/ test needing follow-up: None   Follow-up Appointments: Follow-up Information    [facility]. Schedule an appointment as soon as possible for a visit in 4 week(s).   Specialty:  Neurology Why:  stroke f/u Contact information: [address] [place]       Charlsie Merles, MD Follow up.   Specialty:  Internal Medicine Contact information: [place] [address] [phone]        Charolette Forward, MD Follow up.   Specialty:  Cardiology Why:  Please call this nurmber if you do not receive a call within the next 2-3 days to set up an appointment.  Contact information: [address] [place] [address] [phone]           Hospital Course by problem list: Principal Problem:   TIA (transient ischemic attack) Active Problems:   Diabetes mellitus without complication (HCC)   Hypertension   Chronic kidney disease   Atrial fibrillation ([place])    1. TIA: Patient presented after several transient episodes of dizziness, blurry vision and expressive aphasia. No neurological deficits were observed at the time of admission. Head CT and MRI brain negative for acute infarcts (see below). Carotid US and TTE reports included below. He remained stable during the course of this admission and was discharged on high-intensity atorvastatin and Eliquis for anticoagulation due to new diagnosis of atrial fibrillation discussed above. Home aspirin dose was decreased to 81 mg daily. He will follow up with Neurology as an outpatient in 4 weeks.   2. Atrial fibrillation with RVR: Patient was found to be in Afib with RVR on presentation to the ED. He initially  converted to NSR spontaneously, but developed Afib with RVR on HD#2 and required maximum doses of cardizem and amiodarone drips as well as oral metoprolol. Cardiology was consulted who recommended  increasing his home metoprolol from 25 mg to 50 mg BID and PO amiodarone 200 mg BID. Patient will follow up with Cardiology as an outpatient 1 week after discharge. He was advised to hold his Lasix until his appointment with Cardiology due to AKI.   3. AKI on CKD: Patient found to have Cr of 3.2 on presentation. His baseline renal function is unknown as he received his care at the [place] and records were not available to review. His Cr trended down to 2.2 during this admission and was 2.7 on day of discharge. Patient was instructed to hold his home Lasix until his follow up appointment with Cardiology in 1 week.    4. HTN: Patient's blood pressure remained stable during this admission. His home medications of amlodipine, metoprolol, and losartan were resumed on day of discharge. Home  metoprolol was increased as discussed above.   5. T2DM: Patient's CBG remained below 180 during this admission. A1c 7.9. He was on sliding scale insuline and his home insulin regimen as well as glipizide were resumed on day of discharge.   Discharge Vitals:   BP 123/74 (BP Location: Left Arm)   Pulse 65   Temp 98.9 F (37.2 C) (Oral)   Resp 13   Ht 6\' 1"  (1.854 m)   Wt 235 lb (106.6 kg)   SpO2 100%   BMI 31.00 kg/m   Pertinent Labs, Studies, and Procedures:   CBC Latest Ref Rng & Units 06/20/2017 06/19/2017 06/18/2017  WBC 4.0 - 10.5 K/uL 7.1 5.9 5.4  Hemoglobin 13.0 - 17.0 g/dL 10.5(L) 11.0(L) 10.9(L)  Hematocrit 39.0 - 52.0 % 32.8(L) 34.3(L) 34.3(L)  Platelets 150 - 400 K/uL 216 231 249   BMP Latest Ref Rng & Units 06/20/2017 06/18/2017 06/17/2017  Glucose 65 - 99 mg/dL 235(H) 162(H) 143(H)  BUN 6 - 20 mg/dL 32(H) 36(H) 40(H)  Creatinine 0.61 - 1.24 mg/dL 2.72(H) 2.25(H) 2.62(H)  Sodium 135 - 145 mmol/L 138 139 140  Potassium 3.5 - 5.1 mmol/L 4.1 4.7 4.5  Chloride 101 - 111 mmol/L 106 108 107  CO2 22 - 32 mmol/L 23 22 22   Calcium 8.9 - 10.3 mg/dL 8.6(L) 8.8(L) 8.6(L)   Head CT 3/13: FINDINGS: Brain: No  evidence of acute infarction, hemorrhage, hydrocephalus, extra-axial collection or mass lesion / mass effect.  Prominence of the sulci suggests mild cortical volume loss.  The brainstem and fourth ventricle are within normal limits. The basal ganglia are unremarkable in appearance. The cerebral hemispheres demonstrate grossly normal gray-white differentiation. No mass effect or midline shift is seen.  Vascular: No hyperdense vessel or unexpected calcification.  Skull: There is no evidence of fracture; visualized osseous structures are unremarkable in appearance.  Sinuses/Orbits: The orbits are within normal limits. The paranasal sinuses and mastoid air cells are well-aerated.  Other: No significant soft tissue abnormalities are seen.  MRI/MRA brain 3/14: MRA HEAD FINDINGS Mildly motion degraded.  Antegrade flow in the posterior circulation with dominant distal right vertebral artery. Both vertebral arteries are patent to the junction with the basilar. The left AICA appears patent while the right AICA appears dominant. No basilar stenosis. Patent SCA and PCA origins. The bilateral PCA branches appear patent although branch detail is limited by motion.  Antegrade flow in both ICA siphons. Patent carotid termini. No definite siphon stenosis. Both MCA origins, M1 segments, and MCA bifurcations appear to be patent, although further detail is limited by motion. The right ACA A1 segment might be Dominic. Both proximal A2 segments appear to be patent.  IMPRESSION: 1. Intermittently motion degraded despite repeated imaging attempts. 2. No evidence of acute infarct. The brain parenchyma appears grossly normal for age. 3. Intracranial MRA appears negative for large vessel occlusion, but further detail is limited.  Vascular carotid US 3/15: 1-39% ICA stenosis bilaterally.  TTE 3/16: Study Conclusions - Left ventricle: The cavity size was normal. Wall thickness was    increased in a pattern of mild LVH. Systolic function was mildly   reduced. The estimated ejection fraction was in the range of 45%   to 50%. Although no diagnostic regional wall motion abnormality   was identified, this possibility cannot be completely excluded on   the basis of this study. - Mitral valve: There was mild to moderate regurgitation. - Left atrium: The atrium was moderately dilated. - Pulmonary arteries: PA peak  pressure: 38 mm Hg (S).  Discharge Instructions: Discharge Instructions    Ambulatory referral to Neurology   Complete by:  As directed    Follow up with stroke clinic NP (Jessica Vanschaick or Cecille Rubin, if both not available, consider Zachery Dauer, or [place]) at [facility] in about 4 weeks. Thanks.   Call MD for:  difficulty breathing, headache or visual disturbances   Complete by:  As directed    Call MD for:  extreme fatigue   Complete by:  As directed    Call MD for:  persistant dizziness or light-headedness   Complete by:  As directed    Diet - low sodium heart healthy   Complete by:  As directed    Discharge instructions   Complete by:  As directed    Todd [name],   It was a pleasure taking care of you. We made several changes in your medications as described below:   1- You will start taking Eliquis 5 mg (1 tablet) 2 times a day.  2- You will start taking amiodarone 200 mg (1 tablet) 2 times a day.  3- We increased your metoprolol dose from 25 mg two times a day to 50 mg two times a day.   4- We changed your home aspirin dose as well. You will stop taking aspirin 325 mg. You will start taking aspirin 81 mg every day.  5- Do not take your home Lasix until you see the heart doctor in about 1 week.  You will follow up with Dr. Terrence Dupont (heart doctor) in 1 week. His office will give you a call to set up this appointment. If you have not received a call in the next 2-3 days, please call [phone].   Please make sure to follow up with your regular doctor  within the next month.   Please call us if you have any questions.   Increase activity slowly   Complete by:  As directed       Signed: Welford Roche, MD 06/22/2017, 7:13 AM   Pager: [phone]

## 2017-06-20 NOTE — Progress Notes (Signed)
Discharge instructions (including medications) discussed with and copy provided to patient/caregiver along with paper prescriptions. 

## 2017-06-20 NOTE — Discharge Instructions (Signed)

## 2017-06-21 NOTE — Consult Note (Signed)
            [facility] CM Primary Care Navigator  06/21/2017  Todd [name] 1940/05/10 [id number]   Went to seepatient at the bedside to identify possible discharge needs buthe was already dischargedhome yesterday. Per MD note, patientwasseenfor evaluation after several transient episodes of dizziness and blurry vision (TIA), atrial fibrillation.   Patient has discharge instruction to follow-up with primary care provider (Dr. Herold Harms with [facility]);  Follow-up with cardiology and neurology- appointment in 4 weeks.    For additional questions please contact:  Edwena Felty A. Aizik Reh, BSN, RN-BC [facility] PRIMARY CARE Navigator Cell: [phone]

## 2017-07-17 ENCOUNTER — Other Ambulatory Visit: Payer: Self-pay | Admitting: Internal Medicine

## 2017-07-19 ENCOUNTER — Encounter: Payer: Self-pay | Admitting: Adult Health

## 2017-07-19 ENCOUNTER — Ambulatory Visit: Payer: Medicare Other | Admitting: Adult Health

## 2017-07-19 VITALS — BP 145/75 | HR 56 | Ht 73.0 in | Wt 247.0 lb

## 2017-07-19 DIAGNOSIS — G459 Transient cerebral ischemic attack, unspecified: Secondary | ICD-10-CM

## 2017-07-19 DIAGNOSIS — I480 Paroxysmal atrial fibrillation: Secondary | ICD-10-CM | POA: Diagnosis not present

## 2017-07-19 DIAGNOSIS — I10 Essential (primary) hypertension: Secondary | ICD-10-CM

## 2017-07-19 DIAGNOSIS — E119 Type 2 diabetes mellitus without complications: Secondary | ICD-10-CM

## 2017-07-19 NOTE — Patient Instructions (Addendum)
Continue Eliquis (apixaban) daily  and lipitor  for secondary stroke prevention  Stop aspirin as this is not needed  Continue to follow up the PCP regarding cholesterol management  Continue to follow up with cardiologist regarding blood pressure and atrial fibrillation management  Check blood pressure daily along with blood sugar levels and record  Maintain strict control of hypertension with blood pressure goal below 130/90, diabetes with hemoglobin A1c goal below 6.5% and cholesterol with LDL cholesterol (bad cholesterol) goal below 70 mg/dL. I also advised the patient to eat a healthy diet with plenty of whole grains, cereals, fruits and vegetables, exercise regularly and maintain ideal body weight.  Followup in the future with me in 5 months

## 2017-07-19 NOTE — Progress Notes (Signed)
[facility] [address] [place]. [place] [address] [phone]       OFFICE FOLLOW UP NOTE  [name] Date of Birth:  October 30, 1940 Medical Record Number:  [id number]   Reason for Referral:  hospital TIA follow up  CHIEF COMPLAINT:  Chief Complaint  Patient presents with  . New Patient (Initial Visit)    stroke follow up, seeing cardiology and nephrology and PCP at the [facility]. denies any new symtpoms.    HPI: Todd [name] is being seen today for initial visit in the office for TIA on 06/15/17. History obtained from patient and chart review. Reviewed all radiology images and labs personally.  [name] is a 77 y.o. male with history of HTN, HD, CKD stage III, and DM presenting with 2 episodes of word finding difficulties on 06/15/2017.  CT head reviewed and showed no acute abnormality.  MRI brain reviewed and showed no acute infarct.  MRA head showed no large vessel occlusion.  2D echo showed an EF of 45-50%.  LDL 207 and A1c 7.6.  Patient was previously on aspirin 325 mg PTA.  During admission, he was diagnosed with atrial fibrillation seen on telemetry monitor.  Recommended that patient stop aspirin and start on Eliquis 5 mg twice daily along with Lipitor 80 mg.  Patient was discharged home in stable condition without therapy needs.  Since discharge, patient has been doing well.  He has had follow-up appointments with cardiologist and his atrial fibrillation has been under control.  Continues to take Eliquis without increased bleeding or bruising.  Patient has continued to take aspirin even though this was recommended to stop during hospitalization.  Advised patient to stop taking aspirin and take Eliquis alone.  Continues to take Lipitor without side effects of myalgias.  PCP has been managing diabetes.  Blood pressure today mildly elevated at 145/75.  States he occasionally checks this at home and typically SBP 120-130s.  Is recommended by cardiologist to check this  daily but patient continuing not to do so per wife.  Advised patient that this is important as we are unable to tell adequate blood pressures from 1 appointment and this will give [place] a better view of how his blood pressure is.  Patient verbalized understanding and in agreement.  Also has not recently been checking glucose levels at home and also recommended patient start to do this as well.  Denies new or worsening stroke/TIA symptoms.  ROS:   14 system review of systems performed and negative with exception of leg swelling  PMH:  Past Medical History:  Diagnosis Date  . Asthma    history of asthma  . Chronic kidney disease   . Diabetes mellitus without complication ([place])   . GERD (gastroesophageal reflux disease)   . Hypertension     PSH:  Past Surgical History:  Procedure Laterality Date  . ESOPHAGOGASTRODUODENOSCOPY N/A 10/17/2014   Procedure: ESOPHAGOGASTRODUODENOSCOPY (EGD);  Surgeon: Wilford Corner, MD;  Location: [facility] ENDOSCOPY;  Service: Endoscopy;  Laterality: N/A;  . NO PAST SURGERIES      Social History:  Social History   Socioeconomic History  . Marital status: Married    Spouse name: Not on file  . Number of children: Not on file  . Years of education: Not on file  . Highest education level: Not on file  Occupational History  . Not on file  Social Needs  . Financial resource strain: Not on file  . Food insecurity:    Worry: Not  on file    Inability: Not on file  . Transportation needs:    Medical: Not on file    Non-medical: Not on file  Tobacco Use  . Smoking status: Never Smoker  . Smokeless tobacco: Never Used  Substance and Sexual Activity  . Alcohol use: No  . Drug use: No  . Sexual activity: Not on file  Lifestyle  . Physical activity:    Days per week: Not on file    Minutes per session: Not on file  . Stress: Not on file  Relationships  . Social connections:    Talks on phone: Not on file    Gets together: Not on file    Attends religious  service: Not on file    Active member of club or organization: Not on file    Attends meetings of clubs or organizations: Not on file    Relationship status: Not on file  . Intimate partner violence:    Fear of current or ex partner: Not on file    Emotionally abused: Not on file    Physically abused: Not on file    Forced sexual activity: Not on file  Other Topics Concern  . Not on file  Social History Narrative  . Not on file    Family History:  Family History  Problem Relation Age of Onset  . Diabetes Mother   . Diabetes Father   . Stroke Father   . Diabetes Sister     Medications:   Current Outpatient Medications on File Prior to Visit  Medication Sig Dispense Refill  . albuterol (PROVENTIL HFA;VENTOLIN HFA) 108 (90 Base) MCG/ACT inhaler Inhale 2 puffs into the lungs every 6 (six) hours as needed for wheezing or shortness of breath.    Marland Kitchen amiodarone (PACERONE) 200 MG tablet Take 1 tablet (200 mg total) by mouth 2 (two) times daily. (Patient taking differently: Take 200 mg by mouth daily. ) 60 tablet 0  . amLODipine (NORVASC) 10 MG tablet Take 10 mg by mouth daily.    Marland Kitchen apixaban (ELIQUIS) 5 MG TABS tablet Take 1 tablet (5 mg total) by mouth 2 (two) times daily. 60 tablet 0  . aspirin 81 MG EC tablet Take 1 tablet (81 mg total) by mouth daily. 30 tablet 0  . atorvastatin (LIPITOR) 80 MG tablet Take 1 tablet (80 mg total) by mouth daily at 6 PM. 30 tablet 0  . doxazosin (CARDURA) 4 MG tablet Take 2 mg by mouth at bedtime.    . ferrous sulfate 325 (65 FE) MG tablet Take 325 mg by mouth See admin instructions. Taking on Monday, Wednesday, Friday.    . finasteride (PROSCAR) 5 MG tablet Take 5 mg by mouth daily.    . fluticasone (FLONASE) 50 MCG/ACT nasal spray Place 2 sprays into both nostrils daily as needed for allergies or rhinitis.    Marland Kitchen glipiZIDE (GLUCOTROL) 10 MG tablet Take 20 mg by mouth daily.     . insulin NPH Human (HUMULIN N,NOVOLIN N) 100 UNIT/ML injection Inject 10  Units into the skin every evening.    . lactulose (CHRONULAC) 10 GM/15ML solution Take 10 g by mouth daily as needed for mild constipation or moderate constipation.    Marland Kitchen loratadine (CLARITIN) 10 MG tablet Take 10 mg by mouth daily as needed for allergies.    Marland Kitchen losartan (COZAAR) 100 MG tablet Take 100 mg by mouth daily.    . metoprolol tartrate (LOPRESSOR) 50 MG tablet Take 1 tablet (50 mg  total) by mouth 2 (two) times daily. 60 tablet 0  . omeprazole (PRILOSEC) 20 MG capsule Take 1 capsule by mouth 2 (two) times daily before a meal.     . ondansetron (ZOFRAN) 4 MG tablet Take 1 tablet (4 mg total) by mouth every 6 (six) hours. (Patient taking differently: Take 4 mg by mouth every 6 (six) hours as needed for nausea or vomiting. ) 12 tablet 0  . sennosides-docusate sodium (SENOKOT-S) 8.6-50 MG tablet Take 1 tablet by mouth 2 (two) times daily as needed for constipation.    . Sodium Phosphates (RA SALINE ENEMA RE) Place 1 application rectally as needed (take every other day as needed for constipation).     No current facility-administered medications on file prior to visit.     Allergies:   Allergies  Allergen Reactions  . Lisinopril Cough  . Metformin And Related Other (See Comments)    chronic kidney disease     Physical Exam  Vitals:   07/19/17 0927  BP: (!) 145/75  Pulse: (!) 56  Weight: 247 lb (112 kg)  Height: 6\' 1"  (1.854 m)   Body mass index is 32.59 kg/m. No exam data present  General: well developed, obese pleasant elderly African-American male, well nourished, seated, in no evident distress Head: head normocephalic and atraumatic.   Neck: supple with no carotid or supraclavicular bruits Cardiovascular: regular rate and rhythm, no murmurs Musculoskeletal: no deformity Skin:  no rash/petichiae Vascular:  Normal pulses all extremities  Neurologic Exam Mental Status: Awake and fully alert. Oriented to place and time. Recent and remote memory intact. Attention span,  concentration and fund of knowledge appropriate. Mood and affect appropriate.  Cranial Nerves: Fundoscopic exam reveals sharp disc margins. Pupils equal, briskly reactive to light. Extraocular movements full without nystagmus. Visual fields full to confrontation. Hearing intact. Facial sensation intact. Face, tongue, palate moves normally and symmetrically.  Motor: Normal bulk and tone. Normal strength in all tested extremity muscles. Sensory.: intact to touch , pinprick , position and vibratory sensation.  Coordination: Rapid alternating movements normal in all extremities. Finger-to-nose and heel-to-shin performed accurately bilaterally. Gait and Station: Arises from chair without difficulty. Stance is normal. Gait demonstrates normal stride length and balance . Able to heel, toe and tandem walk without difficulty.  Reflexes: 1+ and symmetric. Toes downgoing.    NIHSS  0 Modified Rankin  1 HAS-BLED 3 CHA2DS2-VASc 8   Diagnostic Data (Labs, Imaging, Testing)  CT head Study date: 06/15/2017 IMPRESSION: 1. No acute intracranial pathology seen on CT. 2. Mild cortical volume loss.  MRI brain MRA head Study date: 06/16/2017 IMPRESSION: 1. Intermittently motion degraded despite repeated imaging attempts. 2. No evidence of acute infarct. The brain parenchyma appears grossly normal for age. 3. Intracranial MRA appears negative for large vessel occlusion, but further detail is limited.  2D echo  study date: 06/18/2017 Study Conclusions - Left ventricle: The cavity size was normal. Wall thickness was   increased in a pattern of mild LVH. Systolic function was mildly   reduced. The estimated ejection fraction was in the range of 45%   to 50%. Although no diagnostic regional wall motion abnormality   was identified, this possibility cannot be completely excluded on   the basis of this study. - Mitral valve: There was mild to moderate regurgitation. - Left atrium: The atrium was  moderately dilated. - Pulmonary arteries: PA peak pressure: 38 mm Hg (S).     ASSESSMENT: Davison Ohms is a 76 y.o. year old  male here with TIA on 06/15/2017 secondary to new diagnosis of atrial fibrillation. Vascular risk factors include atrial fibrillation, HTN, HLD, DM and CKD.     PLAN: -Continue Eliquis (apixaban) daily  and Lipitor for secondary stroke prevention -Stop aspirin as there is no indication -F/u with PCP regarding your HLD management -f/u with cardiologist in regards to atrial fibrillation management -continue to monitor BP and glucose at home with log -Maintain strict control of hypertension with blood pressure goal below 130/90, diabetes with hemoglobin A1c goal below 6.5% and cholesterol with LDL cholesterol (bad cholesterol) goal below 70 mg/dL. I also advised the patient to eat a healthy diet with plenty of whole grains, cereals, fruits and vegetables, exercise regularly and maintain ideal body weight.  Follow up in 5 months or call earlier if needed  Greater than 50% time during this 25 minute consultation visit was spent on counseling and coordination of care about A. fib, HTN, HLD and DM, discussion about risk benefit of anticoagulation and answering questions.   Venancio Poisson, AGNP-BC  [facility] [address] [place], [place] [address]  Phone [phone] Fax [phone]

## 2017-07-20 NOTE — Progress Notes (Signed)
I reviewed above note and agree with the assessment and plan.  Rosalin Hawking, MD PhD Stroke Neurology 07/20/2017 11:14 PM

## 2017-12-19 NOTE — Progress Notes (Addendum)
[facility] [address] [place] [address] [phone]       OFFICE FOLLOW UP NOTE  Todd [name] Date of Birth:  03-Dec-1940 Medical Record Number:  [id number]   Reason for Referral:  hospital TIA follow up  CHIEF COMPLAINT:  Chief Complaint  Patient presents with  . Follow-up    5 month follow up. Wife accomplanied him to the appointment today. Treatment rm. Patient's wife mentions that he has blurred vision in the morning that subsides, he also has some dizziness with sudden position change.     HPI: Todd [name] is being seen today  in the office for TIA on 06/15/17. History obtained from patient and chart review. Reviewed all radiology images and labs personally.  [name] is a 77 y.o. male with history of HTN, HD, CKD stage III, and DM presenting with 2 episodes of word finding difficulties on 06/15/2017.  CT head reviewed and showed no acute abnormality.  MRI brain reviewed and showed no acute infarct.  MRA head showed no large vessel occlusion.  2D echo showed an EF of 45-50%.  LDL 207 and A1c 7.6.  Patient was previously on aspirin 325 mg PTA.  During admission, he was diagnosed with atrial fibrillation seen on telemetry monitor.  Recommended that patient stop aspirin and start on Eliquis 5 mg twice daily along with Lipitor 80 mg.  Patient was discharged home in stable condition without therapy needs.  07/19/2017 visit: Since discharge, patient has been doing well.  He has had follow-up appointments with cardiologist and his atrial fibrillation has been under control.  Continues to take Eliquis without increased bleeding or bruising.  Patient has continued to take aspirin even though this was recommended to stop during hospitalization.  Advised patient to stop taking aspirin and take Eliquis alone.  Continues to take Lipitor without side effects of myalgias.  PCP has been managing diabetes.  Blood pressure today mildly elevated at 145/75.  States he  occasionally checks this at home and typically SBP 120-130s.  Is recommended by cardiologist to check this daily but patient continuing not to do so per wife.  Advised patient that this is important as we are unable to tell adequate blood pressures from 1 appointment and this will give [place] a better view of how his blood pressure is.  Patient verbalized understanding and in agreement.  Also has not recently been checking glucose levels at home and also recommended patient start to do this as well.  Denies new or worsening stroke/TIA symptoms.  Interval history 12/20/2017: Patient is being seen today for stroke follow up and is accompanied by his wife.  He continues to complain of blurry vision and dizziness with position change.  He states he has been having the blurry vision since his stroke and it typically occurs upon awakening and will eventually resolve but can also occur in bright light and will resolve once he does endorse.  He denies worsening in his blurry vision since his stroke.  He also follows up with ophthalmologist and will have an appointment in November.  Does have a history of bilateral cataract surgery.  Blood pressure today 109/54 with a heart rate of 52.  He states after sitting for prolonged period of time, he will stand up and have a dizziness sensation.  Blood pressures at home per wife's logbook show SBP 130-150.  Wife has not brought automatic home blood pressure cuff into any prior appointments in order to ensure it is  accurate when compared to a manual blood pressure cuff.  Rechecked manual pressure while sitting with reading of 108/54 on right arm.  Had patient stand and recheck manual pressure on same arm with reading of 92/48.  Patient denies checking blood pressure at home when he has dizziness sensation.  His metoprolol was recently decreased in half by cardiology but no antihypertensive adjustments were made as his blood pressure has been stable per home readings.  He is followed by  the [facility] for both primary care, cardiology and nephrology.  He continues to take Eliquis for atrial fibrillation without side effects of bleeding or bruising.  Continues to take Lipitor 80 mg without side effects myalgias.  Patient has been staying active as tolerated along with maintaining a healthy diet and also states he has cut out all salt intake.  Per wife, he was told by nephrologist that he needs to increase his fluid intake due to worsening creatinine level (unable to see level in epic) and patient states he typically drinks around 3-4 bottles of water per day.  Denies new or worsening stroke/TIA symptoms.   ROS:   14 system review of systems performed and negative with exception of l blurred vision and dizziness  PMH:  Past Medical History:  Diagnosis Date  . Asthma    history of asthma  . Chronic kidney disease   . Diabetes mellitus without complication ([place])   . GERD (gastroesophageal reflux disease)   . Hypertension     PSH:  Past Surgical History:  Procedure Laterality Date  . ESOPHAGOGASTRODUODENOSCOPY N/A 10/17/2014   Procedure: ESOPHAGOGASTRODUODENOSCOPY (EGD);  Surgeon: Wilford Corner, MD;  Location: [facility] ENDOSCOPY;  Service: Endoscopy;  Laterality: N/A;  . NO PAST SURGERIES      Social History:  Social History   Socioeconomic History  . Marital status: Married    Spouse name: Not on file  . Number of children: Not on file  . Years of education: Not on file  . Highest education level: Not on file  Occupational History  . Not on file  Social Needs  . Financial resource strain: Not on file  . Food insecurity:    Worry: Not on file    Inability: Not on file  . Transportation needs:    Medical: Not on file    Non-medical: Not on file  Tobacco Use  . Smoking status: Never Smoker  . Smokeless tobacco: Never Used  Substance and Sexual Activity  . Alcohol use: No  . Drug use: No  . Sexual activity: Not on file  Lifestyle  . Physical activity:    Days per  week: Not on file    Minutes per session: Not on file  . Stress: Not on file  Relationships  . Social connections:    Talks on phone: Not on file    Gets together: Not on file    Attends religious service: Not on file    Active member of club or organization: Not on file    Attends meetings of clubs or organizations: Not on file    Relationship status: Not on file  . Intimate partner violence:    Fear of current or ex partner: Not on file    Emotionally abused: Not on file    Physically abused: Not on file    Forced sexual activity: Not on file  Other Topics Concern  . Not on file  Social History Narrative  . Not on file    Family History:  Family  History  Problem Relation Age of Onset  . Diabetes Mother   . Diabetes Father   . Stroke Father   . Diabetes Sister     Medications:   Current Outpatient Medications on File Prior to Visit  Medication Sig Dispense Refill  . albuterol (PROVENTIL HFA;VENTOLIN HFA) 108 (90 Base) MCG/ACT inhaler Inhale 2 puffs into the lungs every 6 (six) hours as needed for wheezing or shortness of breath.    Marland Kitchen amiodarone (PACERONE) 200 MG tablet Take 1 tablet (200 mg total) by mouth 2 (two) times daily. (Patient taking differently: Take 200 mg by mouth daily. ) 60 tablet 0  . amLODipine (NORVASC) 10 MG tablet Take 10 mg by mouth daily.    Marland Kitchen apixaban (ELIQUIS) 5 MG TABS tablet Take 1 tablet (5 mg total) by mouth 2 (two) times daily. 60 tablet 0  . atorvastatin (LIPITOR) 80 MG tablet Take 1 tablet (80 mg total) by mouth daily at 6 PM. 30 tablet 0  . doxazosin (CARDURA) 4 MG tablet Take 2 mg by mouth at bedtime.    . ferrous sulfate 325 (65 FE) MG tablet Take 325 mg by mouth daily.     . finasteride (PROSCAR) 5 MG tablet Take 5 mg by mouth daily.    . fluticasone (FLONASE) 50 MCG/ACT nasal spray Place 2 sprays into both nostrils daily as needed for allergies or rhinitis.    . furosemide (LASIX) 40 MG tablet Take 40 mg by mouth 2 (two) times daily.      Marland Kitchen glipiZIDE (GLUCOTROL) 10 MG tablet Take 20 mg by mouth daily.     Marland Kitchen lactulose (CHRONULAC) 10 GM/15ML solution Take 10 g by mouth daily as needed for mild constipation or moderate constipation.    Marland Kitchen loratadine (CLARITIN) 10 MG tablet Take 10 mg by mouth daily as needed for allergies.    Marland Kitchen losartan (COZAAR) 100 MG tablet Take 100 mg by mouth daily.    . metoprolol tartrate (LOPRESSOR) 25 MG tablet Take 25 mg by mouth 2 (two) times daily.    Marland Kitchen omeprazole (PRILOSEC) 20 MG capsule Take 1 capsule by mouth 2 (two) times daily before a meal.     . ondansetron (ZOFRAN) 4 MG tablet Take 1 tablet (4 mg total) by mouth every 6 (six) hours. (Patient taking differently: Take 4 mg by mouth every 6 (six) hours as needed for nausea or vomiting. ) 12 tablet 0  . sennosides-docusate sodium (SENOKOT-S) 8.6-50 MG tablet Take 1 tablet by mouth 2 (two) times daily as needed for constipation.    . Sodium Phosphates (RA SALINE ENEMA RE) Place 1 application rectally as needed (take every other day as needed for constipation).     No current facility-administered medications on file prior to visit.     Allergies:   Allergies  Allergen Reactions  . Lisinopril Cough  . Metformin And Related Other (See Comments)    chronic kidney disease     Physical Exam  Vitals:   12/20/17 1035 12/20/17 1045 12/20/17 1050  BP: (!) 109/54 (!) 108/54 (!) 92/48  Pulse: (!) 52    Weight: 230 lb 6.4 oz (104.5 kg)    Height: 6\' 1"  (1.854 m)    108/54 - sitting 92/48 - standing  General: well developed, obese pleasant elderly African-American male, well nourished, seated, in no evident distress Head: head normocephalic and atraumatic.   Neck: supple with no carotid or supraclavicular bruits Cardiovascular: regular rate and rhythm, no murmurs Musculoskeletal: no deformity Skin:  no rash/petichiae Vascular:  Normal pulses all extremities  Neurologic Exam Mental Status: Awake and fully alert. Oriented to place and time.  Recent and remote memory intact. Attention span, concentration and fund of knowledge appropriate. Mood and affect appropriate.  Cranial Nerves: Fundoscopic exam reveals sharp disc margins. Pupils equal, briskly reactive to light. Extraocular movements full without nystagmus. Visual fields full to confrontation. Hearing intact. Facial sensation intact. Face, tongue, palate moves normally and symmetrically.  Motor: Normal bulk and tone. Normal strength in all tested extremity muscles. Sensory.: intact to touch , pinprick , position and vibratory sensation.  Coordination: Rapid alternating movements normal in all extremities. Finger-to-nose and heel-to-shin performed accurately bilaterally. Gait and Station: Arises from chair without difficulty. Stance is normal. Gait demonstrates normal stride length and balance . Able to heel, toe and tandem walk without difficulty.  Reflexes: 1+ and symmetric. Toes downgoing.     Diagnostic Data (Labs, Imaging, Testing)  CT head Study date: 06/15/2017 IMPRESSION: 1. No acute intracranial pathology seen on CT. 2. Mild cortical volume loss.  MRI brain MRA head Study date: 06/16/2017 IMPRESSION: 1. Intermittently motion degraded despite repeated imaging attempts. 2. No evidence of acute infarct. The brain parenchyma appears grossly normal for age. 3. Intracranial MRA appears negative for large vessel occlusion, but further detail is limited.  2D echo  study date: 06/18/2017 Study Conclusions - Left ventricle: The cavity size was normal. Wall thickness was   increased in a pattern of mild LVH. Systolic function was mildly   reduced. The estimated ejection fraction was in the range of 45%   to 50%. Although no diagnostic regional wall motion abnormality   was identified, this possibility cannot be completely excluded on   the basis of this study. - Mitral valve: There was mild to moderate regurgitation. - Left atrium: The atrium was moderately  dilated. - Pulmonary arteries: PA peak pressure: 38 mm Hg (S).     ASSESSMENT: Bernardino Dowell is a 77 y.o. year old male here with TIA on 06/15/2017 secondary to new diagnosis of atrial fibrillation. Vascular risk factors include atrial fibrillation, HTN, HLD, DM and CKD.  Patient is being seen today for scheduled follow-up visit with complaints of continued stroke deficit of blurry vision and complaints of dizziness upon position changes.    PLAN: -Continue Eliquis (apixaban) daily  and Lipitor for secondary stroke prevention -Recommend patient return to office with home blood pressure cuff in order to compare to manual reading.  If lower blood pressure is accurate -Orthostatic hypotension  -  consider decreasing amlodipine to 5 mg daily.  Also advised patient to increase fluid intake to at least 8-10 bottles of water per day at this current time along with increasing sodium intake.  Also advised patient to ensure he moves his legs prior to standing up after prolonged sitting along with ensuring dizziness subsided prior to ambulating.  Also recommended to continue to monitor blood pressure at home along with monitoring with dizziness sensation -F/u with PCP regarding your HLD and HTN management -f/u with cardiologist in regards to atrial fibrillation management -continue to monitor BP and glucose at home with log -Maintain strict control of hypertension with blood pressure goal below 130/90, diabetes with hemoglobin A1c goal below 6.5% and cholesterol with LDL cholesterol (bad cholesterol) goal below 70 mg/dL. I also advised the patient to eat a healthy diet with plenty of whole grains, cereals, fruits and vegetables, exercise regularly and maintain ideal body weight.  ADDENDUM: Patient return to office with  home blood pressure device.  Manual blood pressure reading on right arm 122/68.  Home blood pressure device reading on right arm 117/60.  Advised patient and wife to continue to monitor at home  along with obtaining blood pressure reading with dizziness episodes.  Recommended to follow-up with cardiologist in regards to orthostatic hypotension.  No need at this time for medication adjustment but did advise wife that if blood pressure readings are consistently, to follow-up with PCP or cardiologist for management of antihypertensives.  Education regarding orthostatic hypotension and home treatment options by [employer] were provided to patient.  Follow up in 6 months or call earlier if needed  Greater than 50% time during this 25 minute consultation visit was spent on counseling and coordination of care about A. fib, HTN, HLD and DM, discussion about risk benefit of anticoagulation and answering questions.   Venancio Poisson, AGNP-BC  [facility] [address] [place], [place] [address]  Phone [phone] Fax [phone]

## 2017-12-20 ENCOUNTER — Ambulatory Visit: Payer: Medicare Other | Admitting: Adult Health

## 2017-12-20 ENCOUNTER — Encounter: Payer: Self-pay | Admitting: Adult Health

## 2017-12-20 VITALS — BP 92/48 | HR 52 | Ht 73.0 in | Wt 230.4 lb

## 2017-12-20 DIAGNOSIS — E119 Type 2 diabetes mellitus without complications: Secondary | ICD-10-CM

## 2017-12-20 DIAGNOSIS — I10 Essential (primary) hypertension: Secondary | ICD-10-CM

## 2017-12-20 DIAGNOSIS — G459 Transient cerebral ischemic attack, unspecified: Secondary | ICD-10-CM | POA: Diagnosis not present

## 2017-12-20 DIAGNOSIS — I480 Paroxysmal atrial fibrillation: Secondary | ICD-10-CM | POA: Diagnosis not present

## 2017-12-20 NOTE — Patient Instructions (Addendum)
Continue Eliquis (apixaban) daily  and lipitor  for secondary stroke prevention  Continue to follow up with PCP regarding cholesterol, diabetes and blood pressure management   Come back to office at some point today to verify accuracy of your home blood pressure cuff  Continue to monitor blood pressure at home  Maintain strict control of hypertension with blood pressure goal below 130/90, diabetes with hemoglobin A1c goal below 6.5% and cholesterol with LDL cholesterol (bad cholesterol) goal below 70 mg/dL. I also advised the patient to eat a healthy diet with plenty of whole grains, cereals, fruits and vegetables, exercise regularly and maintain ideal body weight.  Followup in the future with me in 3 months or call earlier if needed       Thank you for coming to see [place] at [facility]. I hope we have been able to provide you high quality care today.  You may receive a patient satisfaction survey over the next few weeks. We would appreciate your feedback and comments so that we may continue to improve ourselves and the health of our patients.

## 2017-12-20 NOTE — Progress Notes (Signed)
I agree with the above plan 

## 2018-03-21 ENCOUNTER — Ambulatory Visit: Payer: Medicare Other | Admitting: Adult Health

## 2018-05-11 ENCOUNTER — Ambulatory Visit: Payer: Medicare Other | Admitting: Adult Health

## 2019-03-21 ENCOUNTER — Ambulatory Visit (INDEPENDENT_AMBULATORY_CARE_PROVIDER_SITE_OTHER): Payer: No Typology Code available for payment source | Admitting: Physician Assistant

## 2019-03-21 ENCOUNTER — Encounter: Payer: Self-pay | Admitting: Physician Assistant

## 2019-03-21 ENCOUNTER — Telehealth: Payer: Self-pay | Admitting: General Surgery

## 2019-03-21 VITALS — BP 132/54 | HR 82 | Temp 97.8°F | Ht 69.69 in | Wt 233.0 lb

## 2019-03-21 DIAGNOSIS — Z1159 Encounter for screening for other viral diseases: Secondary | ICD-10-CM

## 2019-03-21 DIAGNOSIS — R5383 Other fatigue: Secondary | ICD-10-CM | POA: Diagnosis not present

## 2019-03-21 DIAGNOSIS — D509 Iron deficiency anemia, unspecified: Secondary | ICD-10-CM | POA: Diagnosis not present

## 2019-03-21 MED ORDER — NA SULFATE-K SULFATE-MG SULF 17.5-3.13-1.6 GM/177ML PO SOLN: 1.0000 | Freq: Once | ORAL | 0 refills | Status: AC

## 2019-03-21 NOTE — Telephone Encounter (Signed)
[facility] Pre-operative Risk Assessment     Request for surgical clearance:     Endoscopy Procedure  What type of surgery is being performed?     Endoscopy/Colonoscopy  When is this surgery scheduled?     04/13/2019  What type of clearance is required ?   Pharmacy  Are there any medications that need to be held prior to surgery and how long? Eliquis for 2 days  Practice name and name of physician performing surgery?      [facility]  What is your office phone and fax number?      Phone- [phone]  [phone]  Anesthesia type (None, local, MAC, general) ?       MAC

## 2019-03-21 NOTE — Progress Notes (Signed)
Attending Physician's Attestation   I have reviewed the chart.   I agree with the Advanced Practitioner's note, impression, and recommendations with any updates as below.    Gabriel Mansouraty, MD [facility] Advanced Endoscopy Office # [phone]  

## 2019-03-21 NOTE — Patient Instructions (Signed)
If you are age 80 or older, your body mass index should be between 23-30. Your Body mass index is 33.73 kg/m. If this is out of the aforementioned range listed, please consider follow up with your Primary Care Provider.  If you are age 56 or younger, your body mass index should be between 19-25. Your Body mass index is 33.73 kg/m. If this is out of the aformentioned range listed, please consider follow up with your Primary Care Provider.   We have sent the following medications to your pharmacy for you to pick up at your convenience:  [place] will be contacted by our office prior to your procedure for directions on holding your Eliquis.  If you do not hear from our office 1 week prior to your scheduled procedure, please call [phone] to discuss.    You have been scheduled for an endoscopy and colonoscopy. Please follow the written instructions given to you at your visit today. Please pick up your prep supplies at the pharmacy within the next 1-3 days. If you use inhalers (even only as needed), please bring them with you on the day of your procedure.  Thank you for choosing me and [facility]

## 2019-03-21 NOTE — Progress Notes (Signed)
Subjective:    Patient ID: Todd [name], male    DOB: June 02, 1940, 78 y.o.   MRN: [id number]  HPI Todd [name] is a pleasant 78 year old African-American male, new to GI today referred by the [facility]/Dr. Herold Harms for endoscopic evaluation in setting of iron deficiency anemia. Patient has history of atrial fibrillation and TIA in 2017.  He has maintained on Eliquis.  Also with hypertension, chronic kidney disease and adult onset diabetes mellitus. Reviewing his records in March 2019 hemoglobin was 10.5 hematocrit of 32.8 MCV of 82. We do have a copy of most recent CBC done 03/13/2019 showing hemoglobin of 8.1/hematocrit 25.4 , No MCV done, creatinine 3.0. Patient and his wife both state that he has been on iron supplementation over the past year or so, and recently had 2 iron infusions.  He recently did a Hemoccult which was negative. He has had problems with chronic significant fatigue over the past 1 year. No complaints of heartburn or indigestion no dysphagia or odynophagia.  He tends to eat a good breakfast and is not usually hungry later in the day.  His weight has been stable.  He has been on chronic omeprazole 20 mg p.o. every morning. No complaints of abdominal pain or cramping, no changes in bowel habits melena or hematochezia. He had upper endoscopy per Dr. Cari Caraway in 2016 with finding of antral gastritis and a small duodenal polyp was removed.  Path showed the duodenal mucosa to be benign and gastric biopsy showed benign inactive gastritis no H. pylori. Apparently had colonoscopy at the [facility] 5 to 6 years ago, was told it was fine but to repeat in 5 years. Family history negative for colon cancer . Review of Systems Pertinent positive and negative review of systems were noted in the above HPI section.  All other review of systems was otherwise negative.  Outpatient Encounter Medications as of 03/21/2019  Medication Sig  . albuterol (PROVENTIL HFA;VENTOLIN HFA) 108 (90  Base) MCG/ACT inhaler Inhale 2 puffs into the lungs every 6 (six) hours as needed for wheezing or shortness of breath.  Marland Kitchen amiodarone (PACERONE) 200 MG tablet Take 1 tablet (200 mg total) by mouth 2 (two) times daily. (Patient taking differently: Take 200 mg by mouth daily. )  . amLODipine (NORVASC) 10 MG tablet Take 10 mg by mouth daily.  Marland Kitchen apixaban (ELIQUIS) 5 MG TABS tablet Take 1 tablet (5 mg total) by mouth 2 (two) times daily.  Marland Kitchen atorvastatin (LIPITOR) 80 MG tablet Take 1 tablet (80 mg total) by mouth daily at 6 PM.  . doxazosin (CARDURA) 4 MG tablet Take 2 mg by mouth at bedtime.  . ferrous sulfate 325 (65 FE) MG tablet Take 325 mg by mouth daily.   . finasteride (PROSCAR) 5 MG tablet Take 5 mg by mouth daily.  . fluticasone (FLONASE) 50 MCG/ACT nasal spray Place 2 sprays into both nostrils daily as needed for allergies or rhinitis.  . furosemide (LASIX) 40 MG tablet Take 40 mg by mouth 2 (two) times daily.  Marland Kitchen glipiZIDE (GLUCOTROL) 10 MG tablet Take 20 mg by mouth daily.   Marland Kitchen lactulose (CHRONULAC) 10 GM/15ML solution Take 10 g by mouth daily as needed for mild constipation or moderate constipation.  Marland Kitchen loratadine (CLARITIN) 10 MG tablet Take 10 mg by mouth daily as needed for allergies.  Marland Kitchen losartan (COZAAR) 100 MG tablet Take 100 mg by mouth daily.  . metoprolol tartrate (LOPRESSOR) 25 MG tablet Take 25 mg by mouth 2 (two)  times daily.  Marland Kitchen omeprazole (PRILOSEC) 20 MG capsule Take 1 capsule by mouth 2 (two) times daily before a meal.   . ondansetron (ZOFRAN) 4 MG tablet Take 1 tablet (4 mg total) by mouth every 6 (six) hours. (Patient taking differently: Take 4 mg by mouth every 6 (six) hours as needed for nausea or vomiting. )  . sennosides-docusate sodium (SENOKOT-S) 8.6-50 MG tablet Take 1 tablet by mouth 2 (two) times daily as needed for constipation.  . Sodium Phosphates (RA SALINE ENEMA RE) Place 1 application rectally as needed (take every other day as needed for constipation).  . Na  Sulfate-K Sulfate-Mg Sulf 17.5-3.13-1.6 GM/177ML SOLN Take 1 kit by mouth once for 1 dose.   No facility-administered encounter medications on file as of 03/21/2019.   Allergies  Allergen Reactions  . Lisinopril Cough  . Metformin And Related Other (See Comments)    chronic kidney disease   Patient Active Problem List   Diagnosis Date Noted  . Iron deficiency anemia 03/21/2019  . Atrial fibrillation ([place]) 06/17/2017  . TIA (transient ischemic attack) 06/16/2017  . Diabetes mellitus without complication ([place])   . Hypertension   . Chronic kidney disease   . Abdominal pain, generalized 10/17/2014   Social History   Socioeconomic History  . Marital status: Married    Spouse name: Not on file  . Number of children: Not on file  . Years of education: Not on file  . Highest education level: Not on file  Occupational History  . Not on file  Tobacco Use  . Smoking status: Never Smoker  . Smokeless tobacco: Never Used  Substance and Sexual Activity  . Alcohol use: No  . Drug use: No  . Sexual activity: Not on file  Other Topics Concern  . Not on file  Social History Narrative  . Not on file   Social Determinants of Health   Financial Resource Strain:   . Difficulty of Paying Living Expenses: Not on file  Food Insecurity:   . Worried About Charity fundraiser in the Last Year: Not on file  . Ran Out of Food in the Last Year: Not on file  Transportation Needs:   . Lack of Transportation (Medical): Not on file  . Lack of Transportation (Non-Medical): Not on file  Physical Activity:   . Days of Exercise per Week: Not on file  . Minutes of Exercise per Session: Not on file  Stress:   . Feeling of Stress : Not on file  Social Connections:   . Frequency of Communication with Friends and Family: Not on file  . Frequency of Social Gatherings with Friends and Family: Not on file  . Attends Religious Services: Not on file  . Active Member of Clubs or Organizations: Not on file   . Attends Archivist Meetings: Not on file  . Marital Status: Not on file  Intimate Partner Violence:   . Fear of Current or Ex-Partner: Not on file  . Emotionally Abused: Not on file  . Physically Abused: Not on file  . Sexually Abused: Not on file    [name] family history includes Colon cancer in his father; Diabetes in his father, mother, and sister; Stroke in his father.      Objective:    Vitals:   03/21/19 1328  BP: (!) 132/54  Pulse: 82  Temp: 97.8 F (36.6 C)    Physical Exam Well-developed well-nourished  [place] AA male  in no acute distress.  Accompanied by his wife.   Weight, 233 BMI 33.7  HEENT; nontraumatic normocephalic, EOMI, PER R LA, sclera anicteric. Oropharynx; not examined/mask/Covid Neck; supple, no JVD Cardiovascular; regular rate and rhythm with S1-S2, no murmur rub or gallop Pulmonary; Clear bilaterally Abdomen; soft, obese, nontender, nondistended, no palpable mass or hepatosplenomegaly, bowel sounds are active Rectal; not done today Skin; benign exam, no jaundice rash or appreciable lesions Extremities; no clubbing cyanosis or edema skin warm and dry Neuro/Psych; alert and oriented x4, grossly nonfocal mood and affect appropriate       Assessment & Plan:   #[age] year old African-American male with iron deficiency anemia, recent heme-negative stool.  Symptomatic with chronic fatigue, patient has no GI complaints.  Etiology of anemia is not clear, suspect secondary to chronic intermittent low-grade GI blood loss.  Will need to rule out occult colonic neoplasm, upper gut neoplasm, AVMs.  Patient does have chronic kidney disease as well which may be contributing to his chronic anemia.  #2 chronic anticoagulation-on Eliquis 3.  History of atrial fibrillation 4.  History of TIA 2017 5.  Hypertension 6.  Adult onset diabetes mellitus  Plan; patient will be scheduled for Colonoscopy and upper Endoscopy with Dr. Rush Landmark.  Both  procedures were discussed in detail with the patient including indications risks and benefits and he is agreeable to proceed. Patient will need to hold Eliquis for 2 days prior to procedures.  We will communicate with his primary physician at the [facility] /Dr. Herold Harms to assure this is reasonable for this patient. They indicate that they have follow-up with nephrology later this month and he will have a repeat CBC at that time.  Lakeisa Heninger S Ailani Governale PA-C 03/21/2019   Cc: Borum, Jaci Standard, MD

## 2019-03-22 ENCOUNTER — Other Ambulatory Visit: Payer: Self-pay

## 2019-03-22 MED ORDER — NA SULFATE-K SULFATE-MG SULF 17.5-3.13-1.6 GM/177ML PO SOLN: 1.0000 | Freq: Once | ORAL | 0 refills | Status: AC

## 2019-03-29 NOTE — Telephone Encounter (Signed)
Received a fax from Dr Rondell Reams stating the patient can stop his Eliquis 2 days prior to his procedure on 04/13/2019. Contacted the [place] residence and spoke with his wife. She was instructed for the patient to take his dose on 04/10/2019 but hold his medication on 04/11/2019 and 04/12/2019. The patients wife verbalized understanding.

## 2019-04-06 HISTORY — PX: COLONOSCOPY WITH ESOPHAGOGASTRODUODENOSCOPY (EGD): SHX5779

## 2019-04-11 ENCOUNTER — Ambulatory Visit: Payer: Non-veteran care

## 2019-04-11 ENCOUNTER — Other Ambulatory Visit: Payer: Self-pay | Admitting: Gastroenterology

## 2019-04-11 DIAGNOSIS — Z1159 Encounter for screening for other viral diseases: Secondary | ICD-10-CM

## 2019-04-11 DIAGNOSIS — K219 Gastro-esophageal reflux disease without esophagitis: Secondary | ICD-10-CM | POA: Insufficient documentation

## 2019-04-12 LAB — SARS CORONAVIRUS 2 (TAT 6-24 HRS): SARS Coronavirus 2: NEGATIVE

## 2019-04-13 ENCOUNTER — Ambulatory Visit (AMBULATORY_SURGERY_CENTER): Payer: No Typology Code available for payment source | Admitting: Gastroenterology

## 2019-04-13 ENCOUNTER — Encounter: Payer: Self-pay | Admitting: Gastroenterology

## 2019-04-13 ENCOUNTER — Other Ambulatory Visit: Payer: Self-pay

## 2019-04-13 VITALS — BP 145/70 | HR 70 | Temp 98.4°F | Resp 13 | Ht 69.6 in | Wt 233.0 lb

## 2019-04-13 DIAGNOSIS — K317 Polyp of stomach and duodenum: Secondary | ICD-10-CM | POA: Diagnosis not present

## 2019-04-13 DIAGNOSIS — K5730 Diverticulosis of large intestine without perforation or abscess without bleeding: Secondary | ICD-10-CM | POA: Diagnosis not present

## 2019-04-13 DIAGNOSIS — K648 Other hemorrhoids: Secondary | ICD-10-CM

## 2019-04-13 DIAGNOSIS — D509 Iron deficiency anemia, unspecified: Secondary | ICD-10-CM

## 2019-04-13 DIAGNOSIS — D126 Benign neoplasm of colon, unspecified: Secondary | ICD-10-CM

## 2019-04-13 DIAGNOSIS — D125 Benign neoplasm of sigmoid colon: Secondary | ICD-10-CM

## 2019-04-13 DIAGNOSIS — K3189 Other diseases of stomach and duodenum: Secondary | ICD-10-CM

## 2019-04-13 DIAGNOSIS — K6389 Other specified diseases of intestine: Secondary | ICD-10-CM

## 2019-04-13 DIAGNOSIS — K219 Gastro-esophageal reflux disease without esophagitis: Secondary | ICD-10-CM

## 2019-04-13 DIAGNOSIS — D122 Benign neoplasm of ascending colon: Secondary | ICD-10-CM

## 2019-04-13 DIAGNOSIS — D123 Benign neoplasm of transverse colon: Secondary | ICD-10-CM

## 2019-04-13 MED ORDER — SODIUM CHLORIDE 0.9 % IV SOLN: 500.0000 mL | Freq: Once | INTRAVENOUS | Status: DC

## 2019-04-13 NOTE — Progress Notes (Signed)
PT taken to PACU. Monitors in place. VSS. Report given to RN. 

## 2019-04-13 NOTE — Patient Instructions (Signed)
Polyps removed today. Wait for biopsies.  7 small polyps in colon. 2 polyps: one in stomach and one in colon left.  Office staff will call you to set up office appt.  Will discuss possibly redoing these procedures at hospital to finish removing. Diverticulosis and hemorrhoids noted.   Restart blood thinner (Eliquis) in evening on 04/15/2019.  Eat high fiber diet and take FIBERCON 1 TABLET EVERYDAY.   YOU HAD AN ENDOSCOPIC PROCEDURE TODAY AT [facility]:   Refer to the procedure report that was given to you for any specific questions about what was found during the examination.  If the procedure report does not answer your questions, please call your gastroenterologist to clarify.  If you requested that your care partner not be given the details of your procedure findings, then the procedure report has been included in a sealed envelope for you to review at your convenience later.  YOU SHOULD EXPECT: Some feelings of bloating in the abdomen. Passage of more gas than usual.  Walking can help get rid of the air that was put into your GI tract during the procedure and reduce the bloating. If you had a lower endoscopy (such as a colonoscopy or flexible sigmoidoscopy) you may notice spotting of blood in your stool or on the toilet paper. If you underwent a bowel prep for your procedure, you may not have a normal bowel movement for a few days.  Please Note:  You might notice some irritation and congestion in your nose or some drainage.  This is from the oxygen used during your procedure.  There is no need for concern and it should clear up in a day or so.  SYMPTOMS TO REPORT IMMEDIATELY:   Following lower endoscopy (colonoscopy or flexible sigmoidoscopy):  Excessive amounts of blood in the stool  Significant tenderness or worsening of abdominal pains  Swelling of the abdomen that is new, acute  Fever of 100F or higher   Following upper endoscopy (EGD)  Vomiting of blood or coffee  ground material  New chest pain or pain under the shoulder blades  Painful or persistently difficult swallowing  New shortness of breath  Fever of 100F or higher  Black, tarry-looking stools  For urgent or emergent issues, a gastroenterologist can be reached at any hour by calling [phone].   DIET:  We do recommend a small meal at first, but then you may proceed to your regular diet.  Drink plenty of fluids but you should avoid alcoholic beverages for 24 hours.  ACTIVITY:  You should plan to take it easy for the rest of today and you should NOT DRIVE or use heavy machinery until tomorrow (because of the sedation medicines used during the test).    FOLLOW UP: Our staff will call the number listed on your records 48-72 hours following your procedure to check on you and address any questions or concerns that you may have regarding the information given to you following your procedure. If we do not reach you, we will leave a message.  We will attempt to reach you two times.  During this call, we will ask if you have developed any symptoms of COVID 19. If you develop any symptoms (ie: fever, flu-like symptoms, shortness of breath, cough etc.) before then, please call [phone].  If you test positive for Covid 19 in the 2 weeks post procedure, please call and report this information to [place].    If any biopsies were taken you will be  contacted by phone or by letter within the next 1-3 weeks.  Please call us at [phone] if you have not heard about the biopsies in 3 weeks.    SIGNATURES/CONFIDENTIALITY: You and/or your care partner have signed paperwork which will be entered into your electronic medical record.  These signatures attest to the fact that that the information above on your After Visit Summary has been reviewed and is understood.  Full responsibility of the confidentiality of this discharge information lies with you and/or your care-partner.

## 2019-04-13 NOTE — Progress Notes (Signed)
Temp by [facility] and vitals by [facility]

## 2019-04-13 NOTE — Progress Notes (Signed)
Called to room to assist during endoscopic procedure.  Patient ID and intended procedure confirmed with present staff. Received instructions for my participation in the procedure from the performing physician.  

## 2019-04-13 NOTE — Op Note (Signed)
[place] Patient Name: Todd [name] Procedure Date: 04/13/2019 1:07 PM MRN: [id number] Endoscopist: Justice Britain , MD Age: 79 Referring MD:  Date of Birth: Mar 18, 1941 Gender: Male Account #: [id number] Procedure:                Colonoscopy Indications:              Iron deficiency anemia, Anemia Medicines:                Monitored Anesthesia Care Procedure:                Pre-Anesthesia Assessment:                           - Prior to the procedure, a History and Physical                            was performed, and patient medications and                            allergies were reviewed. The patient's tolerance of                            previous anesthesia was also reviewed. The risks                            and benefits of the procedure and the sedation                            options and risks were discussed with the patient.                            All questions were answered, and informed consent                            was obtained. Prior Anticoagulants: The patient has                            taken Eliquis (apixaban), last dose was 2 days                            prior to procedure. ASA Grade Assessment: III - A                            patient with severe systemic disease. After                            reviewing the risks and benefits, the patient was                            deemed in satisfactory condition to undergo the                            procedure.  After obtaining informed consent, the colonoscope                            was passed under direct vision. Throughout the                            procedure, the patient's blood pressure, pulse, and                            oxygen saturations were monitored continuously. The                            Colonoscope was introduced through the anus and                            advanced to the 4 cm into the ileum. The   colonoscopy was performed without difficulty. The                            patient tolerated the procedure. The quality of the                            bowel preparation was adequate. The terminal ileum,                            ileocecal valve, appendiceal orifice, and rectum                            were photographed. Scope In: 1:26:43 PM Scope Out: 1:48:18 PM Scope Withdrawal Time: 0 hours 18 minutes 31 seconds  Total Procedure Duration: 0 hours 21 minutes 35 seconds  Findings:                 The digital rectal exam findings include                            hemorrhoids. Pertinent negatives include no                            palpable rectal lesions.                           The terminal ileum and ileocecal valve appeared                            normal.                           Seven sessile polyps were found in the sigmoid                            colon (1), splenic flexure (1), transverse colon                            (1), hepatic flexure (2), and ascending colon (2).  The polyps were 2 to 5 mm in size. These polyps                            were removed with a cold snare. Resection and                            retrieval were complete.                           A single small-mouthed diverticulum was found in                            the ascending colon.                           Normal mucosa was found in the entire colon                            otherwise.                           Non-bleeding non-thrombosed external and internal                            hemorrhoids were found during retroflexion, during                            perianal exam and during digital exam. The                            hemorrhoids were Grade II (internal hemorrhoids                            that prolapse but reduce spontaneously). Complications:            No immediate complications. Estimated Blood Loss:     Estimated blood loss was  minimal. Impression:               - Hemorrhoids found on digital rectal exam.                           - The examined portion of the ileum was normal.                           - Seven 2 to 5 mm polyps in the sigmoid colon, at                            the splenic flexure, in the transverse colon, at                            the hepatic flexure and in the ascending colon,                            removed with a cold snare. Resected and retrieved.                           -  Diverticulosis in the ascending colon.                           - Normal mucosa in the entire examined colon.                           - Non-bleeding non-thrombosed external and internal                            hemorrhoids. Recommendation:           - The patient will be observed post-procedure,                            until all discharge criteria are met.                           - Discharge patient to home.                           - Patient has a contact number available for                            emergencies. The signs and symptoms of potential                            delayed complications were discussed with the                            patient. Return to normal activities tomorrow.                            Written discharge instructions were provided to the                            patient.                           - High fiber diet.                           - Use FiberCon 1 tablet PO daily.                           - Continue present medications.                           - Resume Eliquis (apixaban) at prior dose in 2 days.                           - Await pathology results.                           - Repeat colonoscopy in 3 years for surveillance                            based on pathology  results and findings of                            adenomatous tissue, although his age is such that                            one could consider no further colonoscopies at that                             time if he had deteriorating health, otherwise if                            health is good then will consider at that time with                            PCP discussion.                           - The findings and recommendations were discussed                            with the patient.                           - Once results of pathology return, will set up                            follow up in clinic and have                            CBC/Iron/TIBC/Ferritin/B12/Folate/Reticulocyte                            performed. If patient has evidence of persistent                            anemia of iron deficiency then will consider role                            of VCE after Iron infusions. As noted on EGD report                            will need clinic visit to also discuss role of EGD                            with EMR gastric/duodenal polyps.                           - The findings and recommendations were discussed                            with the patient. Justice Britain, MD 04/13/2019 2:07:54 PM

## 2019-04-13 NOTE — Op Note (Signed)
[place] Patient Name: Todd [name] Procedure Date: 04/13/2019 1:07 PM MRN: [id number] Endoscopist: Justice Britain , MD Age: 79 Referring MD:  Date of Birth: [birth year] Gender: Male Account #: [id number] Procedure:                Upper GI endoscopy Indications:              Anemia Medicines:                Monitored Anesthesia Care Procedure:                Pre-Anesthesia Assessment:                           - Prior to the procedure, a History and Physical                            was performed, and patient medications and                            allergies were reviewed. The patient's tolerance of                            previous anesthesia was also reviewed. The risks                            and benefits of the procedure and the sedation                            options and risks were discussed with the patient.                            All questions were answered, and informed consent                            was obtained. Prior Anticoagulants: The patient has                            taken Eliquis (apixaban), last dose was 2 days                            prior to procedure. ASA Grade Assessment: III - A                            patient with severe systemic disease. After                            reviewing the risks and benefits, the patient was                            deemed in satisfactory condition to undergo the                            procedure.  After obtaining informed consent, the endoscope was                            passed under direct vision. Throughout the                            procedure, the patient's blood pressure, pulse, and                            oxygen saturations were monitored continuously. The                            Endoscope was introduced through the mouth, and                            advanced to the second part of duodenum. The upper                            GI  endoscopy was accomplished without difficulty.                            The patient tolerated the procedure. Scope In: Scope Out: Findings:                 No gross lesions were noted in the entire esophagus.                           The Z-line was regular and was found 40 cm from the                            incisors.                           Patchy mildly erythematous mucosa without bleeding                            was found in the cardia and in the gastric body.                           Diffuse severe mucosal changes characterized by                            black discoloration were found in the gastric                            antrum - consistent with melanosis.                           One 14 mm semi-sessile polyp with no bleeding and                            no stigmata of recent bleeding was found in the  prepyloric region of the stomach. Biopsies were                            taken with a cold forceps for histology to rule out                            adenomatous change.                           No other gross lesions were noted in the entire                            examined stomach otherwise. Biopsies were taken                            with a cold forceps for histology and Helicobacter                            pylori testing.                           Diffuse moderate mucosal changes characterized by                            black discoloration were found in the entire                            duodenum - consistent with melanosis.                           A single 10 mm pedunculated polyp with no bleeding                            was found in the duodenal bulb. Biopsies were taken                            with a cold forceps for histology to rule out                            adenomatous change.                           No other gross lesions were noted in the duodenal                            bulb, in the  first portion of the duodenum and in                            the second portion of the duodenum. Biopsies for                            histology were taken with a cold forceps for  evaluation of celiac disease. Complications:            No immediate complications. Estimated Blood Loss:     Estimated blood loss was minimal. Impression:               - No gross lesions in esophagus. Z-line regular, 40                            cm from the incisors.                           - Erythematous mucosa in the cardia and gastric                            body. Black discolored mucosa in the antrum -                            consistent with melanosis. Biopsied for HP.                           - One gastric polyp. Biopsied.                           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                           - A single duodenal polyp. Biopsied.                           - No other gross lesions in the duodenal bulb, in                            the first portion of the duodenum and in the second                            portion of the duodenum. Biopsied for Celiac. Recommendation:           - Proceed to scheduled colonoscopy.                           - Observe patient's clinical course.                           - Await pathology results.                           - Repeat upper endoscopy with EMR in the next 2-4                            months for resection of likely hyperplastic polyp                            in antrum as well as  duodenal polyp, in the                            hospital-based setting, if patient/family agree in                            scheduled follow up discussion (unless adenomatous                            change is found that could consider earlier                            procedure).                           - Anticoagulation restart noted on Colonscopy                             report.                           - The findings and recommendations were discussed                            with the patient. Justice Britain, MD 04/13/2019 2:01:19 PM

## 2019-04-16 ENCOUNTER — Telehealth: Payer: Self-pay

## 2019-04-16 NOTE — Telephone Encounter (Signed)
error 

## 2019-04-17 ENCOUNTER — Telehealth: Payer: Self-pay

## 2019-04-17 NOTE — Telephone Encounter (Signed)
  Follow up Call-  Call back number 04/13/2019  Post procedure Call Back phone  # [phone]  Permission to leave phone message Yes  Some recent data might be hidden     Patient questions:  Do you have a fever, pain , or abdominal swelling? No. Pain Score  0 *  Have you tolerated food without any problems? Yes.    Have you been able to return to your normal activities? Yes.    Do you have any questions about your discharge instructions: Diet   No. Medications  No. Follow up visit  No.  Do you have questions or concerns about your Care? No.  Actions: * If pain score is 4 or above: 1. No action needed, pain <4.Have you developed a fever since your procedure? no  2.   Have you had an respiratory symptoms (SOB or cough) since your procedure? no  3.   Have you tested positive for COVID 19 since your procedure no  4.   Have you had any family members/close contacts diagnosed with the COVID 19 since your procedure?  no   If yes to any of these questions please route to Joylene John, RN and Alphonsa Gin, Therapist, sports.

## 2019-04-19 ENCOUNTER — Encounter: Payer: Self-pay | Admitting: Gastroenterology

## 2019-04-19 ENCOUNTER — Telehealth: Payer: Self-pay

## 2019-04-19 DIAGNOSIS — D122 Benign neoplasm of ascending colon: Secondary | ICD-10-CM

## 2019-04-19 DIAGNOSIS — D509 Iron deficiency anemia, unspecified: Secondary | ICD-10-CM

## 2019-04-19 DIAGNOSIS — D125 Benign neoplasm of sigmoid colon: Secondary | ICD-10-CM

## 2019-04-19 DIAGNOSIS — D123 Benign neoplasm of transverse colon: Secondary | ICD-10-CM

## 2019-04-19 DIAGNOSIS — D126 Benign neoplasm of colon, unspecified: Secondary | ICD-10-CM

## 2019-04-19 NOTE — Telephone Encounter (Signed)
-----   Message from Irving Copas., MD sent at 04/19/2019  1:50 PM EST ----- Regarding: Follow-up Todd [name],  The [facility] nurse will send the EGD/colonoscopy results.The patient needs to be scheduled for a clinic visit in the next 4 weeks to discuss possible gastric/duodenal polyp EMR's.  The patient also need to have a CBC/iron/TIBC/ferritin/reticulocyte count/B12/folate performed.  Please have him do the labs 1 week before clinic visit.  Based on his iron indices we will consider the role of a video capsule endoscopy if he continues to have iron deficiency.Thanks.GM

## 2019-04-23 NOTE — Telephone Encounter (Signed)
The pt has been scheduled for ROV on 2/18 and labs 1 week prior.  The pt and his wife have been advised.  The pt has been advised of the information and verbalized understanding.

## 2019-05-24 ENCOUNTER — Ambulatory Visit: Payer: Non-veteran care | Admitting: Gastroenterology

## 2019-05-30 ENCOUNTER — Ambulatory Visit: Payer: Non-veteran care | Admitting: Gastroenterology

## 2019-05-31 ENCOUNTER — Ambulatory Visit: Payer: Non-veteran care | Admitting: Gastroenterology

## 2019-06-04 ENCOUNTER — Other Ambulatory Visit (INDEPENDENT_AMBULATORY_CARE_PROVIDER_SITE_OTHER): Payer: No Typology Code available for payment source

## 2019-06-04 DIAGNOSIS — D123 Benign neoplasm of transverse colon: Secondary | ICD-10-CM

## 2019-06-04 DIAGNOSIS — D122 Benign neoplasm of ascending colon: Secondary | ICD-10-CM | POA: Diagnosis not present

## 2019-06-04 DIAGNOSIS — D509 Iron deficiency anemia, unspecified: Secondary | ICD-10-CM | POA: Diagnosis not present

## 2019-06-04 DIAGNOSIS — D126 Benign neoplasm of colon, unspecified: Secondary | ICD-10-CM | POA: Diagnosis not present

## 2019-06-04 DIAGNOSIS — D125 Benign neoplasm of sigmoid colon: Secondary | ICD-10-CM

## 2019-06-04 LAB — CBC WITH DIFFERENTIAL/PLATELET
Basophils Absolute: 0 10*3/uL (ref 0.0–0.1)
Basophils Relative: 0.5 % (ref 0.0–3.0)
Eosinophils Absolute: 0.2 10*3/uL (ref 0.0–0.7)
Eosinophils Relative: 4.3 % (ref 0.0–5.0)
HCT: 27.7 % — ABNORMAL LOW (ref 39.0–52.0)
Hemoglobin: 9.4 g/dL — ABNORMAL LOW (ref 13.0–17.0)
Lymphocytes Relative: 27.7 % (ref 12.0–46.0)
Lymphs Abs: 1.2 10*3/uL (ref 0.7–4.0)
MCHC: 33.8 g/dL (ref 30.0–36.0)
MCV: 82.5 fl (ref 78.0–100.0)
Monocytes Absolute: 0.3 10*3/uL (ref 0.1–1.0)
Monocytes Relative: 7.8 % (ref 3.0–12.0)
Neutro Abs: 2.6 10*3/uL (ref 1.4–7.7)
Neutrophils Relative %: 59.7 % (ref 43.0–77.0)
Platelets: 188 10*3/uL (ref 150.0–400.0)
RBC: 3.36 Mil/uL — ABNORMAL LOW (ref 4.22–5.81)
RDW: 14.2 % (ref 11.5–15.5)
WBC: 4.4 10*3/uL (ref 4.0–10.5)

## 2019-06-04 LAB — FOLATE: Folate: 14.8 ng/mL (ref >5.9)

## 2019-06-04 LAB — IBC PANEL
Iron: 68 ug/dL (ref 42–165)
Saturation Ratios: 26.7 % (ref 20.0–50.0)
Transferrin: 182 mg/dL — ABNORMAL LOW (ref 212.0–360.0)

## 2019-06-04 LAB — RETICULOCYTES
ABS Retic: 34400 cells/uL (ref 25000–9000)
Retic Ct Pct: 1 %

## 2019-06-04 LAB — VITAMIN B12: Vitamin B-12: >1500 pg/mL — ABNORMAL HIGH (ref 211–911)

## 2019-06-04 LAB — FERRITIN: Ferritin: 357 ng/mL — ABNORMAL HIGH (ref 22.0–322.0)

## 2019-06-12 ENCOUNTER — Encounter: Payer: Self-pay | Admitting: Gastroenterology

## 2019-06-12 ENCOUNTER — Ambulatory Visit (INDEPENDENT_AMBULATORY_CARE_PROVIDER_SITE_OTHER): Payer: No Typology Code available for payment source | Admitting: Gastroenterology

## 2019-06-12 VITALS — BP 126/50 | HR 73 | Temp 98.2°F | Ht 69.68 in | Wt 224.2 lb

## 2019-06-12 DIAGNOSIS — K317 Polyp of stomach and duodenum: Secondary | ICD-10-CM

## 2019-06-12 DIAGNOSIS — K6389 Other specified diseases of intestine: Secondary | ICD-10-CM

## 2019-06-12 DIAGNOSIS — D649 Anemia, unspecified: Secondary | ICD-10-CM | POA: Diagnosis not present

## 2019-06-12 DIAGNOSIS — Z86010 Personal history of colonic polyps: Secondary | ICD-10-CM | POA: Diagnosis not present

## 2019-06-12 NOTE — Patient Instructions (Addendum)
You are currently not having IDA, however I do recommend that you continue Ferrous Gluconate as directed.   I also recommend a referral to Hematology for history of IDA.   We will discuss EMR of polyps at next clinic visit that will take place in 2.5 months. At that time I would also recommend repeat labs CBC, and IBC+Ferritin 1 week prior to visit.   See attached Colon/Endo reports and pathology.   Your provider has requested that you go to the basement level for lab work 1 week prior to next visit clinic visit. Press "B" on the elevator. The lab is located at the first door on the left as you exit the elevator.   Thank you for choosing me and [facility].  Dr. Rush Landmark

## 2019-06-12 NOTE — Progress Notes (Signed)
[facility]ISIT   Primary Care Provider [place], [address], [place] [address] [phone]  Patient Profile: Todd [name] is a 79 y.o. male with a pmh significant for asthma/COPD, A. fib (on Eliquis), hypertension, hyperlipidemia, diabetes, chronic renal insufficiency, GERD, adenomatous colon polyps, diverticulosis, hyperplastic gastric polyp, enlarged though benign duodenal polyp.  The patient presents to the [facility] for an evaluation and management of problem(s) noted below:  Problem List 1. Normocytic anemia   2. Hyperplastic polyp of stomach   3. Polyp of small intestine   4. Hx of adenomatous colonic polyps     History of Present Illness Please see initial consultation note by PA [facility] for full details of HPI.  Interval History The patient returns with his wife for a scheduled follow-up.  The patient underwent laboratories last week that showed a persistent anemia of a normochromic nature.  His iron indices are normal showing no evidence of persistent iron deficiency.  The patient has not noted any significant changes in his bowel habits.  There has been no blood in the stools.  The patient feels much stronger than he did back in the fall/winter.  His previous blood counts have been as low as the 70s.  He is scheduled for follow-up with his primary care doctor later this week.  He has never seen a hematologist.  The patient's chronic renal insufficiency persists and he sees a nephrologist in the [place].  He has not been initiated or never given erythropoietin per his and his wife's report.  They did receive the pathology results with the findings of his upper and lower endoscopy.  We discussed the 2 polyps that were found during his endoscopy.  Patient does not take significant nonsteroidals or BC/Goody powders.  GI Review of Systems Positive as above Negative for dysphagia, odynophagia, change  in bowel habits, melena, hematochezia, pain, bloating  Review of Systems General: Denies fevers/chills/weight loss Cardiovascular: Denies current chest pain/palpitations Pulmonary: Denies shortness of breath/cough Gastroenterological: See HPI Genitourinary: Denies darkened urine or hematuria Hematological: Denies easy bruising/bleeding Dermatological: Denies jaundice Psychological: Mood is stable   Medications Current Outpatient Medications  Medication Sig Dispense Refill   albuterol (PROVENTIL HFA;VENTOLIN HFA) 108 (90 Base) MCG/ACT inhaler Inhale 2 puffs into the lungs every 6 (six) hours as needed for wheezing or shortness of breath.     amiodarone (PACERONE) 200 MG tablet Take 1 tablet (200 mg total) by mouth 2 (two) times daily. (Patient taking differently: Take 200 mg by mouth daily. ) 60 tablet 0   amLODipine (NORVASC) 10 MG tablet Take 10 mg by mouth daily.     apixaban (ELIQUIS) 5 MG TABS tablet Take 1 tablet (5 mg total) by mouth 2 (two) times daily. 60 tablet 0   atorvastatin (LIPITOR) 80 MG tablet Take 1 tablet (80 mg total) by mouth daily at 6 PM. 30 tablet 0   doxazosin (CARDURA) 4 MG tablet Take 2 mg by mouth at bedtime.     ferrous sulfate 325 (65 FE) MG tablet Take 325 mg by mouth daily.      finasteride (PROSCAR) 5 MG tablet Take 5 mg by mouth daily.     fluticasone (FLONASE) 50 MCG/ACT nasal spray Place 2 sprays into both nostrils daily as needed for allergies or rhinitis.     furosemide (LASIX) 40 MG tablet Take 40 mg by mouth 2 (two) times daily. Takes 40 mg on MWF     hydrALAZINE (APRESOLINE) 10  MG tablet Take 50 mg by mouth 3 (three) times daily. PT NOT SURE OF MILLIGRAM     lactulose (CHRONULAC) 10 GM/15ML solution Take 10 g by mouth daily as needed for mild constipation or moderate constipation.     loratadine (CLARITIN) 10 MG tablet Take 10 mg by mouth daily as needed for allergies.     losartan (COZAAR) 100 MG tablet Take 100 mg by mouth daily.      omeprazole (PRILOSEC) 20 MG capsule Take 1 capsule by mouth 2 (two) times daily before a meal.      sennosides-docusate sodium (SENOKOT-S) 8.6-50 MG tablet Take 1 tablet by mouth 2 (two) times daily as needed for constipation.     Sodium Phosphates (RA SALINE ENEMA RE) Place 1 application rectally as needed (take every other day as needed for constipation).     ondansetron (ZOFRAN) 4 MG tablet Take 1 tablet (4 mg total) by mouth every 6 (six) hours. (Patient taking differently: Take 4 mg by mouth as needed for nausea or vomiting. ) 12 tablet 0   No current facility-administered medications for this visit.    Allergies Allergies  Allergen Reactions   Lisinopril Cough   Metformin And Related Other (See Comments)    chronic kidney disease    Histories Past Medical History:  Diagnosis Date   Anemia    Asthma    history of asthma   Atrial fibrillation ([place])    Chronic kidney disease    Diabetes mellitus without complication (HCC)    GERD (gastroesophageal reflux disease)    Hypertension    Past Surgical History:  Procedure Laterality Date   COLONOSCOPY WITH ESOPHAGOGASTRODUODENOSCOPY (EGD)  04/2019   ESOPHAGOGASTRODUODENOSCOPY N/A 10/17/2014   Procedure: ESOPHAGOGASTRODUODENOSCOPY (EGD);  Surgeon: Wilford Corner, MD;  Location: [facility] ENDOSCOPY;  Service: Endoscopy;  Laterality: N/A;   Social History   Socioeconomic History   Marital status: Married    Spouse name: Not on file   Number of children: 2   Years of education: Not on file   Highest education level: Not on file  Occupational History   Occupation: retired  Tobacco Use   Smoking status: Never Smoker   Smokeless tobacco: Never Used  Substance and Sexual Activity   Alcohol use: No   Drug use: No   Sexual activity: Not on file  Other Topics Concern   Not on file  Social History Narrative   Not on file   Social Determinants of Health   Financial Resource Strain:    Difficulty of  Paying Living Expenses:   Food Insecurity:    Worried About Charity fundraiser in the Last Year:    Arboriculturist in the Last Year:   Transportation Needs:    Film/video editor (Medical):    Lack of Transportation (Non-Medical):   Physical Activity:    Days of Exercise per Week:    Minutes of Exercise per Session:   Stress:    Feeling of Stress :   Social Connections:    Frequency of Communication with Friends and Family:    Frequency of Social Gatherings with Friends and Family:    Attends Religious Services:    Active Member of Clubs or Organizations:    Attends Music therapist:    Marital Status:   Intimate Partner Violence:    Fear of Current or Ex-Partner:    Emotionally Abused:    Physically Abused:    Sexually Abused:    Family History  Problem Relation Age of Onset   Diabetes Mother    Diabetes Father    Stroke Father    Colon cancer Father    Diabetes Sister    Stomach cancer Neg Hx    Pancreatic cancer Neg Hx    Esophageal cancer Neg Hx    Inflammatory bowel disease Neg Hx    Liver disease Neg Hx    I have reviewed his medical, social, and family history in detail and updated the electronic medical record as necessary.    PHYSICAL EXAMINATION  BP (!) 126/50    Pulse 73    Temp 98.2 F (36.8 C)    Ht 5' 9.68" (1.77 m)    Wt 224 lb 4 oz (101.7 kg)    BMI 32.47 kg/m  Wt Readings from Last 3 Encounters:  06/12/19 224 lb 4 oz (101.7 kg)  04/13/19 233 lb (105.7 kg)  03/21/19 233 lb (105.7 kg)  GEN: NAD, appears stated age, doesn't appear chronically ill, accompanied by wife PSYCH: Cooperative, without pressured speech EYE: Conjunctivae pink, sclerae anicteric ENT: MMM CV: Nontachycardic RESP: No wheezing present GI: NABS, soft, protuberant abdomen, rounded, NT/ND, without rebound or guarding MSK/EXT: Bilateral lower extremity edema present SKIN: No jaundice NEURO:  Alert & Oriented x 3, no focal  deficits   REVIEW OF DATA  I reviewed the following data at the time of this encounter:  GI Procedures and Studies  January 2021 EGD - No gross lesions in esophagus. Z-line regular, 40 cm from the incisors. - Erythematous mucosa in the cardia and gastric body. Black discolored mucosa in the antrum - consistent with melanosis. Biopsied for HP. - One gastric polyp. Biopsied. - Mucosal changes in the duodenum - consistent with melanosis. - A single duodenal polyp. Biopsied. - No other gross lesions in the duodenal bulb, in the first portion of the duodenum and in the second portion of the duodenum. Biopsied for Celiac.  January 2021 colonoscopy - Hemorrhoids found on digital rectal exam. - The examined portion of the ileum was normal. - Seven 2 to 5 mm polyps in the sigmoid colon, at the splenic flexure, in the transverse colon, at the hepatic flexure and in the ascending colon, removed with a cold snare. Resected and retrieved. - Diverticulosis in the ascending colon. - Normal mucosa in the entire examined colon. - Non-bleeding non-thrombosed external and internal hemorrhoids.  Pathology Diagnosis 1. Surgical [P], duodenal bulb, 2nd portion of duodenum, and distal duodenum - NO SIGNIFICANT PATHOLOGIC FINDINGS. 2. Surgical [P], duodenal polyp - POLYPOID DUODENAL MUCOSA WITH NONSPECIFIC SIDEROSIS WITH GRANULAR HEMOSIDERIN-CONTAINING MICROPHAGES. 3. Surgical [P], gastric antrum and gastric body - GASTRIC ANTRAL AND OXYNTIC MUCOSA WITH NONSPECIFIC SIDEROSIS WITH GRANULAR HEMOSIDERIN-CONTAINING MACROPHAGES. 4. Surgical [P], gastric polyp - POLYPOID GASTRIC MUCOSA WITH NONSPECIFIC SIDEROSIS WITH GRANULAR HEMOSIDERIN-CONTAINING MACROPHAGES. 5. Surgical [P], colon, ascending, hepatic flexure, transverse, splenic flexure, sigmoid, polyp (7) - TUBULAR ADENOMA (X MULTIPLE). - NEGATIVE FOR HIGH GRADE DYSPLASIA.  Laboratory Studies  Reviewed those in epic  Imaging Studies  2016  gastric emptying study IMPRESSION: Normal gastric emptying study.  2016 HIDA IMPRESSION: Normal exam  2016 right upper quadrant ultrasound IMPRESSION: Normal right upper quadrant ultrasound.   ASSESSMENT  Mr. Kerney is a 79 y.o. male with a pmh significant for asthma/COPD, A. fib (on Eliquis), hypertension, hyperlipidemia, diabetes, chronic renal insufficiency, GERD, adenomatous colon polyps, diverticulosis, hyperplastic gastric polyp, enlarged though benign duodenal polyp.  The patient is seen today for evaluation and management of:  1. Normocytic  anemia   2. Hyperplastic polyp of stomach   3. Polyp of small intestine   4. Hx of adenomatous colonic polyps    The patient is hemodynamically and clinically stable.  His hemoglobin is improved from the fall of last year but he remains with a normocytic anemia without evidence of iron deficiency.  I would recommend that before a video capsule endoscopy is performed that we either see the patient had recurrent iron deficiency or occult GI bleeding.  At this point I think his anemia is likely multifactorial and may be related to his chronic renal insufficiency.  He is going to be seeing his primary care doctor soon.  I recommend a referral to hematology for further evaluation and discussion with hematology as well as PCP and with his nephrologist as to the utility of potential Epogen or other similar types of factors.  We will hold off on video capsule endoscopy for now.  We did discuss the possibility of an endoscopic mucosal resection of the gastric hyperplastic polyp as well as the polyp in the duodenum.  Although both showed no evidence of precancerous changes currently there is stated that in the gastric polyps that are hyperplastic and greater than 1 cm and consideration of resection should be had because of the 1% chance of lifetime risk/change of polyp into dysplastic nature.  I think it is reasonable for [place] to wait a few months to see how  things go in regards to his anemia prior to a potential resection.  We will plan to see the patient back in approximately 2 months and have laboratories performed.  Once he sees his primary care doctor in the [place] if they want to place a referral to hematology I am fine with that if the patient wants [place] to place a referral to hematology I am also okay with that they will let us know.  All patient and family questions were answered, to the best of my ability, and the patient and wife agree to the aforementioned plan of action with follow-up as indicated.   PLAN  Laboratories as outlined below to be obtained 1 week prior to clinic visit in 2 to 2.5 months All labs and endoscopies were printed and given to the patient so that they could give this to PCP if they had not received things as of yet Recommend discussion with PCP/nephrology at the [facility] to consider Epogen Hematology referral is recommended at this time We will hold on video capsule endoscopy unless evidence of iron deficiency recurs Continue oral iron once daily Follow-up and discussion of potential EMR of gastric/duodenal polyp at the future visit   Orders Placed This Encounter  Procedures   CBC   IBC + Ferritin    New Prescriptions   No medications on file   Modified Medications   No medications on file    Planned Follow Up Return in about 2 months (around 08/12/2019).   Total Time in Face-to-Face and in Coordination of Care for patient including independent/personal interpretation/review of prior testing, medical history, examination, medication adjustment, communicating results with the patient directly, and documentation with the EHR is 30 minutes.   Justice Britain, MD [facility] Advanced Endoscopy Office # [phone]

## 2019-06-14 ENCOUNTER — Encounter: Payer: Self-pay | Admitting: Gastroenterology

## 2019-06-14 DIAGNOSIS — D649 Anemia, unspecified: Secondary | ICD-10-CM | POA: Insufficient documentation

## 2019-06-14 DIAGNOSIS — K6389 Other specified diseases of intestine: Secondary | ICD-10-CM | POA: Insufficient documentation

## 2019-06-14 DIAGNOSIS — K317 Polyp of stomach and duodenum: Secondary | ICD-10-CM | POA: Insufficient documentation

## 2019-06-14 DIAGNOSIS — Z86010 Personal history of colonic polyps: Secondary | ICD-10-CM | POA: Insufficient documentation

## 2019-10-03 ENCOUNTER — Ambulatory Visit: Payer: Non-veteran care | Admitting: Gastroenterology

## 2019-11-08 ENCOUNTER — Telehealth: Payer: Self-pay

## 2019-11-08 NOTE — Telephone Encounter (Signed)
The pt has been called and spoke with the wife to schedule recall EGD for gastric polyp with Dr Rush Landmark in the hospital.  Pt is on Eliquis.  The pt wife states she will call back after she speaks with her husband.

## 2019-11-15 ENCOUNTER — Telehealth: Payer: Self-pay | Admitting: Gastroenterology

## 2019-11-15 NOTE — Telephone Encounter (Signed)
Left message on machine to call back  

## 2019-11-16 NOTE — Telephone Encounter (Signed)
Patient returned your call, please call patient one more time.   

## 2019-11-16 NOTE — Telephone Encounter (Signed)
Left message on machine to call back possible date of 9/27 with Dr Jerilynn Mages

## 2019-11-19 ENCOUNTER — Other Ambulatory Visit: Payer: Self-pay

## 2019-11-19 DIAGNOSIS — D649 Anemia, unspecified: Secondary | ICD-10-CM

## 2019-11-19 DIAGNOSIS — K317 Polyp of stomach and duodenum: Secondary | ICD-10-CM

## 2019-11-19 NOTE — Telephone Encounter (Signed)
Patients wife is calling to schedule an appt.

## 2019-11-19 NOTE — Telephone Encounter (Signed)
The pt has been scheduled for EGD at [facility] with Dr Rush Landmark on 01/21/20 at 1030 am.  COVID test on 10/16.  The pt wife instructed and all information mailed to the pt.

## 2020-01-18 ENCOUNTER — Encounter (HOSPITAL_COMMUNITY): Payer: Self-pay | Admitting: Gastroenterology

## 2020-01-18 NOTE — Progress Notes (Signed)
Anesthesia Chart Review: SAME DAY WORK-UP (ENDO)  Case: [id number] Date/Time: 01/21/20 1030   Procedure: ESOPHAGOGASTRODUODENOSCOPY (EGD) WITH PROPOFOL (N/A )   Anesthesia type: Monitor Anesthesia Care   Pre-op diagnosis: gastric polyp   Location: [facility]   Surgeons: Mansouraty, Telford Nab., MD      DISCUSSION: Todd [name] is an 79 year old male scheduled for the above procedure.  History includes never smoker, asthma, GERD, HTN, DM2, CKD (stage III), afib, TIA (06/15/17), anemia.   He previously had EGD and colonoscopy on 04/13/19 at [facility].   He is on Eliquis for afib.  He reported instructions to hold Eliquis for EGD after 01/18/20 dose.   Preprocedure COVID-19 test is scheduled for 01/19/20. He is a same day work-up, so anesthesia team to evaluate on the day of procedure.    VS: Ht 6\' 1"  (1.854 m)   Wt 100.2 kg   BMI 29.16 kg/m  As of 06/12/19, WT 101.7 kg, BP 126/50, HR 73.   PROVIDERS: Charlsie Merles, MD is PCP [facility]). Currently the only available records are those from 01/23/19 that are scanned under Media tab (04/02/19) indicating afib and DM were both controlled, and he had upcoming appointment with nephrology for CKD stage III.  - He reportedly sees a nephrologist and cardiologist with the [facility]. Locally, he was evaluated by Charolette Forward, MD in March 2019 for afib with RVR (last office visit 06/27/17). Rosalin Hawking, MD is neurologist. Last evaluated 12/20/17 by Venancio Poisson, NP following TIA hospitalization.    LABS: He is primarily followed at the [facility], so limited records available. As of 06/04/19, H/H 9.4/27.7, PLT 188. In March 2019, Creatinine range 2.25-3.27 and has known CKD stage III.   EKG: Last available EKG is from 06/16/17 and showed atrial flutter, incomplete LBBB.   CV: Echo 06/18/17: Study Conclusions  - Left ventricle: The cavity size was normal. Wall thickness was  increased in a pattern of mild LVH.  Systolic function was mildly  reduced. The estimated ejection fraction was in the range of 45%  to 50%. Although no diagnostic regional wall motion abnormality  was identified, this possibility cannot be completely excluded on  the basis of this study.  - Mitral valve: There was mild to moderate regurgitation.  - Left atrium: The atrium was moderately dilated.  - Pulmonary arteries: PA peak pressure: 38 mm Hg (S).   Carotid US 06/17/17: Final Interpretation:  - Right Carotid: Velocities in the right ICA are consistent with a 1-39%  stenosis.  - Left Carotid: Velocities in the left ICA are consistent with a 1-39%  stenosis.  - Vertebrals: Bilateral vertebral arteries demonstrate antegrade flow.    Past Medical History:  Diagnosis Date  . Anemia   . Asthma    history of asthma  . Atrial fibrillation ([place])   . Chronic kidney disease   . Diabetes mellitus without complication ([place])    type 2 - no meds  . GERD (gastroesophageal reflux disease)   . HLD (hyperlipidemia)   . Hypertension   . Seasonal allergies   . TIA (transient ischemic attack) 06/15/2017    Past Surgical History:  Procedure Laterality Date  . COLONOSCOPY WITH ESOPHAGOGASTRODUODENOSCOPY (EGD)  04/2019  . ESOPHAGOGASTRODUODENOSCOPY N/A 10/17/2014   Procedure: ESOPHAGOGASTRODUODENOSCOPY (EGD);  Surgeon: Wilford Corner, MD;  Location: [facility] ENDOSCOPY;  Service: Endoscopy;  Laterality: N/A;  . multiple tooth implants     pt thinks sedation    MEDICATIONS: No current facility-administered  medications for this encounter.   Marland Kitchen albuterol (PROVENTIL HFA;VENTOLIN HFA) 108 (90 Base) MCG/ACT inhaler  . amLODipine (NORVASC) 10 MG tablet  . apixaban (ELIQUIS) 5 MG TABS tablet  . atorvastatin (LIPITOR) 80 MG tablet  . calcitRIOL (ROCALTROL) 0.25 MCG capsule  . cholecalciferol (VITAMIN D) 25 MCG (1000 UNIT) tablet  . docusate sodium (COLACE) 50 MG capsule  . doxazosin (CARDURA) 8 MG tablet  . ferrous sulfate 325  (65 FE) MG tablet  . finasteride (PROSCAR) 5 MG tablet  . fluticasone (FLONASE) 50 MCG/ACT nasal spray  . furosemide (LASIX) 40 MG tablet  . hydrALAZINE (APRESOLINE) 100 MG tablet  . lactulose (CHRONULAC) 10 GM/15ML solution  . loratadine (CLARITIN) 10 MG tablet  . losartan (COZAAR) 100 MG tablet  . NIFEdipine (ADALAT CC) 60 MG 24 hr tablet  . omeprazole (PRILOSEC) 20 MG capsule  . polycarbophil (FIBERCON) 625 MG tablet  . sennosides-docusate sodium (SENOKOT-S) 8.6-50 MG tablet  . Sodium Phosphates (RA SALINE ENEMA RE)  . vitamin B-12 (CYANOCOBALAMIN) 500 MCG tablet    Myra Gianotti, PA-C Surgical Short Stay/Anesthesiology [facility] Phone [phone] [facility] Phone [phone] 01/18/2020 3:49 PM

## 2020-01-18 NOTE — Progress Notes (Signed)
Spoke with Wife Todd [name] for [employer].Marland Kitchen  PCP - Dr Herold Harms Cardiologist - [facility]  Chest x-ray - n/a EKG - DOS 01/21/20 Stress Test - n/a ECHO - 06/18/17 Cardiac Cath - n/a  Fasting Blood Sugar - Unknown Checks Blood Sugar 0 times a day Patient does not check blood sugar and is not on any diabetes medication.  Anesthesia review: Yes  Last dose of eliquis is today, 01/18/20 per MD instructions.  STOP now taking any Aspirin (unless otherwise instructed by your surgeon), Aleve, Naproxen, Ibuprofen, Motrin, Advil, Goody's, BC's, all herbal medications, fish oil, and all vitamins.   Coronavirus Screening Covid test scheduled on 01/19/20 Do you have any of the following symptoms:  Cough yes/no: No Fever (>100.71F)  yes/no: No Runny nose yes/no: No Sore throat yes/no: No Difficulty breathing/shortness of breath  yes/no: No  Have you traveled in the last 14 days and where? yes/no: No  Wife Todd [name] verbalized understanding of instructions that were given via phone.

## 2020-01-18 NOTE — Anesthesia Preprocedure Evaluation (Addendum)
Anesthesia Evaluation  Patient identified by MRN, date of birth, ID band Patient awake    Reviewed: Allergy & Precautions, H&P , NPO status , Patient's Chart, lab work & pertinent test results  Airway Mallampati: II   Neck ROM: full    Dental   Pulmonary asthma ,    breath sounds clear to auscultation       Cardiovascular hypertension, + dysrhythmias Atrial Fibrillation  Rhythm:regular Rate:Normal     Neuro/Psych TIA   GI/Hepatic GERD  ,  Endo/Other  diabetes, Type 2  Renal/GU      Musculoskeletal   Abdominal   Peds  Hematology   Anesthesia Other Findings   Reproductive/Obstetrics                            Anesthesia Physical Anesthesia Plan  ASA: III  Anesthesia Plan: MAC   Post-op Pain Management:    Induction: Intravenous  PONV Risk Score and Plan: 1 and Propofol infusion and Treatment may vary due to age or medical condition  Airway Management Planned: Nasal Cannula  Additional Equipment:   Intra-op Plan:   Post-operative Plan:   Informed Consent: I have reviewed the patients History and Physical, chart, labs and discussed the procedure including the risks, benefits and alternatives for the proposed anesthesia with the patient or authorized representative who has indicated his/her understanding and acceptance.       Plan Discussed with: CRNA, Anesthesiologist and Surgeon  Anesthesia Plan Comments: (PAT note written 01/18/2020 by Myra Gianotti, PA-C. )       Anesthesia Quick Evaluation

## 2020-01-19 ENCOUNTER — Other Ambulatory Visit (HOSPITAL_COMMUNITY)
Admission: RE | Admit: 2020-01-19 | Discharge: 2020-01-19 | Disposition: A | Discharge status: Home / Self Care | Payer: No Typology Code available for payment source | Source: Ambulatory Visit | Attending: Gastroenterology | Admitting: Gastroenterology

## 2020-01-19 DIAGNOSIS — J690 Pneumonitis due to inhalation of food and vomit: Secondary | ICD-10-CM | POA: Diagnosis not present

## 2020-01-19 DIAGNOSIS — R0602 Shortness of breath: Secondary | ICD-10-CM | POA: Diagnosis not present

## 2020-01-19 DIAGNOSIS — Z20822 Contact with and (suspected) exposure to covid-19: Secondary | ICD-10-CM | POA: Insufficient documentation

## 2020-01-19 DIAGNOSIS — Z01812 Encounter for preprocedural laboratory examination: Secondary | ICD-10-CM | POA: Insufficient documentation

## 2020-01-19 LAB — SARS CORONAVIRUS 2 (TAT 6-24 HRS): SARS Coronavirus 2: NEGATIVE

## 2020-01-21 ENCOUNTER — Inpatient Hospital Stay (HOSPITAL_COMMUNITY)
Admission: EM | Admit: 2020-01-21 | Discharge: 2020-01-29 | DRG: 177 | Disposition: A | Discharge status: Rehabilitation Facility | Payer: No Typology Code available for payment source | Attending: Family Medicine | Admitting: Family Medicine

## 2020-01-21 ENCOUNTER — Emergency Department (HOSPITAL_COMMUNITY): Payer: No Typology Code available for payment source

## 2020-01-21 ENCOUNTER — Ambulatory Visit (HOSPITAL_COMMUNITY): Payer: No Typology Code available for payment source | Admitting: Vascular Surgery

## 2020-01-21 ENCOUNTER — Telehealth: Payer: Self-pay | Admitting: Gastroenterology

## 2020-01-21 ENCOUNTER — Encounter (HOSPITAL_COMMUNITY): Payer: Self-pay | Admitting: Gastroenterology

## 2020-01-21 ENCOUNTER — Encounter (HOSPITAL_COMMUNITY): Payer: Self-pay | Admitting: Student

## 2020-01-21 ENCOUNTER — Encounter (HOSPITAL_COMMUNITY)
Admission: RE | Disposition: A | Discharge status: Home / Self Care | Payer: Self-pay | Source: Home / Self Care | Attending: Gastroenterology

## 2020-01-21 ENCOUNTER — Ambulatory Visit (HOSPITAL_BASED_OUTPATIENT_CLINIC_OR_DEPARTMENT_OTHER)
Admission: RE | Admit: 2020-01-21 | Discharge: 2020-01-21 | Disposition: A | Discharge status: Home / Self Care | Payer: No Typology Code available for payment source | Source: Home / Self Care | Attending: Gastroenterology | Admitting: Gastroenterology

## 2020-01-21 ENCOUNTER — Other Ambulatory Visit: Payer: Self-pay

## 2020-01-21 DIAGNOSIS — I4891 Unspecified atrial fibrillation: Secondary | ICD-10-CM

## 2020-01-21 DIAGNOSIS — K2289 Other specified disease of esophagus: Secondary | ICD-10-CM | POA: Diagnosis present

## 2020-01-21 DIAGNOSIS — J189 Pneumonia, unspecified organism: Secondary | ICD-10-CM | POA: Diagnosis not present

## 2020-01-21 DIAGNOSIS — Z6830 Body mass index (BMI) 30.0-30.9, adult: Secondary | ICD-10-CM

## 2020-01-21 DIAGNOSIS — E785 Hyperlipidemia, unspecified: Secondary | ICD-10-CM | POA: Diagnosis present

## 2020-01-21 DIAGNOSIS — E876 Hypokalemia: Secondary | ICD-10-CM | POA: Diagnosis present

## 2020-01-21 DIAGNOSIS — I5032 Chronic diastolic (congestive) heart failure: Secondary | ICD-10-CM | POA: Diagnosis present

## 2020-01-21 DIAGNOSIS — K317 Polyp of stomach and duodenum: Secondary | ICD-10-CM

## 2020-01-21 DIAGNOSIS — Z823 Family history of stroke: Secondary | ICD-10-CM | POA: Insufficient documentation

## 2020-01-21 DIAGNOSIS — N184 Chronic kidney disease, stage 4 (severe): Secondary | ICD-10-CM | POA: Diagnosis present

## 2020-01-21 DIAGNOSIS — I10 Essential (primary) hypertension: Secondary | ICD-10-CM | POA: Diagnosis present

## 2020-01-21 DIAGNOSIS — E872 Acidosis: Secondary | ICD-10-CM | POA: Diagnosis present

## 2020-01-21 DIAGNOSIS — I959 Hypotension, unspecified: Secondary | ICD-10-CM | POA: Diagnosis present

## 2020-01-21 DIAGNOSIS — K219 Gastro-esophageal reflux disease without esophagitis: Secondary | ICD-10-CM | POA: Diagnosis present

## 2020-01-21 DIAGNOSIS — N183 Chronic kidney disease, stage 3 unspecified: Secondary | ICD-10-CM

## 2020-01-21 DIAGNOSIS — N179 Acute kidney failure, unspecified: Secondary | ICD-10-CM | POA: Diagnosis present

## 2020-01-21 DIAGNOSIS — N189 Chronic kidney disease, unspecified: Secondary | ICD-10-CM | POA: Insufficient documentation

## 2020-01-21 DIAGNOSIS — J45909 Unspecified asthma, uncomplicated: Secondary | ICD-10-CM | POA: Diagnosis present

## 2020-01-21 DIAGNOSIS — E1122 Type 2 diabetes mellitus with diabetic chronic kidney disease: Secondary | ICD-10-CM | POA: Insufficient documentation

## 2020-01-21 DIAGNOSIS — Z20822 Contact with and (suspected) exposure to covid-19: Secondary | ICD-10-CM | POA: Diagnosis present

## 2020-01-21 DIAGNOSIS — N400 Enlarged prostate without lower urinary tract symptoms: Secondary | ICD-10-CM | POA: Diagnosis present

## 2020-01-21 DIAGNOSIS — R0602 Shortness of breath: Secondary | ICD-10-CM | POA: Diagnosis present

## 2020-01-21 DIAGNOSIS — I6330 Cerebral infarction due to thrombosis of unspecified cerebral artery: Secondary | ICD-10-CM | POA: Diagnosis not present

## 2020-01-21 DIAGNOSIS — I130 Hypertensive heart and chronic kidney disease with heart failure and stage 1 through stage 4 chronic kidney disease, or unspecified chronic kidney disease: Secondary | ICD-10-CM | POA: Diagnosis present

## 2020-01-21 DIAGNOSIS — E0822 Diabetes mellitus due to underlying condition with diabetic chronic kidney disease: Secondary | ICD-10-CM | POA: Diagnosis not present

## 2020-01-21 DIAGNOSIS — I69351 Hemiplegia and hemiparesis following cerebral infarction affecting right dominant side: Secondary | ICD-10-CM | POA: Diagnosis not present

## 2020-01-21 DIAGNOSIS — E119 Type 2 diabetes mellitus without complications: Secondary | ICD-10-CM | POA: Diagnosis not present

## 2020-01-21 DIAGNOSIS — K5901 Slow transit constipation: Secondary | ICD-10-CM | POA: Diagnosis not present

## 2020-01-21 DIAGNOSIS — J690 Pneumonitis due to inhalation of food and vomit: Principal | ICD-10-CM

## 2020-01-21 DIAGNOSIS — I6381 Other cerebral infarction due to occlusion or stenosis of small artery: Secondary | ICD-10-CM | POA: Diagnosis not present

## 2020-01-21 DIAGNOSIS — D509 Iron deficiency anemia, unspecified: Secondary | ICD-10-CM | POA: Diagnosis present

## 2020-01-21 DIAGNOSIS — G8191 Hemiplegia, unspecified affecting right dominant side: Secondary | ICD-10-CM | POA: Diagnosis present

## 2020-01-21 DIAGNOSIS — Z79899 Other long term (current) drug therapy: Secondary | ICD-10-CM | POA: Insufficient documentation

## 2020-01-21 DIAGNOSIS — D72829 Elevated white blood cell count, unspecified: Secondary | ICD-10-CM

## 2020-01-21 DIAGNOSIS — G8929 Other chronic pain: Secondary | ICD-10-CM

## 2020-01-21 DIAGNOSIS — Z7901 Long term (current) use of anticoagulants: Secondary | ICD-10-CM | POA: Diagnosis not present

## 2020-01-21 DIAGNOSIS — I340 Nonrheumatic mitral (valve) insufficiency: Secondary | ICD-10-CM | POA: Insufficient documentation

## 2020-01-21 DIAGNOSIS — Z800 Family history of malignant neoplasm of digestive organs: Secondary | ICD-10-CM | POA: Insufficient documentation

## 2020-01-21 DIAGNOSIS — E669 Obesity, unspecified: Secondary | ICD-10-CM | POA: Diagnosis present

## 2020-01-21 DIAGNOSIS — R531 Weakness: Secondary | ICD-10-CM

## 2020-01-21 DIAGNOSIS — Z833 Family history of diabetes mellitus: Secondary | ICD-10-CM | POA: Insufficient documentation

## 2020-01-21 DIAGNOSIS — Z888 Allergy status to other drugs, medicaments and biological substances status: Secondary | ICD-10-CM | POA: Insufficient documentation

## 2020-01-21 DIAGNOSIS — J9620 Acute and chronic respiratory failure, unspecified whether with hypoxia or hypercapnia: Secondary | ICD-10-CM | POA: Diagnosis present

## 2020-01-21 DIAGNOSIS — I6302 Cerebral infarction due to thrombosis of basilar artery: Secondary | ICD-10-CM

## 2020-01-21 DIAGNOSIS — I129 Hypertensive chronic kidney disease with stage 1 through stage 4 chronic kidney disease, or unspecified chronic kidney disease: Secondary | ICD-10-CM | POA: Insufficient documentation

## 2020-01-21 DIAGNOSIS — J9601 Acute respiratory failure with hypoxia: Secondary | ICD-10-CM | POA: Diagnosis present

## 2020-01-21 DIAGNOSIS — N1831 Chronic kidney disease, stage 3a: Secondary | ICD-10-CM | POA: Diagnosis not present

## 2020-01-21 DIAGNOSIS — Z8673 Personal history of transient ischemic attack (TIA), and cerebral infarction without residual deficits: Secondary | ICD-10-CM

## 2020-01-21 DIAGNOSIS — E7800 Pure hypercholesterolemia, unspecified: Secondary | ICD-10-CM | POA: Diagnosis present

## 2020-01-21 DIAGNOSIS — M545 Low back pain, unspecified: Secondary | ICD-10-CM

## 2020-01-21 DIAGNOSIS — R29702 NIHSS score 2: Secondary | ICD-10-CM | POA: Diagnosis present

## 2020-01-21 DIAGNOSIS — Z8679 Personal history of other diseases of the circulatory system: Secondary | ICD-10-CM

## 2020-01-21 DIAGNOSIS — I480 Paroxysmal atrial fibrillation: Secondary | ICD-10-CM | POA: Diagnosis present

## 2020-01-21 DIAGNOSIS — L814 Other melanin hyperpigmentation: Secondary | ICD-10-CM | POA: Insufficient documentation

## 2020-01-21 DIAGNOSIS — R5381 Other malaise: Secondary | ICD-10-CM | POA: Diagnosis not present

## 2020-01-21 DIAGNOSIS — E1165 Type 2 diabetes mellitus with hyperglycemia: Secondary | ICD-10-CM | POA: Diagnosis not present

## 2020-01-21 DIAGNOSIS — D649 Anemia, unspecified: Secondary | ICD-10-CM

## 2020-01-21 DIAGNOSIS — F32A Depression, unspecified: Secondary | ICD-10-CM | POA: Diagnosis present

## 2020-01-21 DIAGNOSIS — I639 Cerebral infarction, unspecified: Secondary | ICD-10-CM | POA: Diagnosis present

## 2020-01-21 DIAGNOSIS — F419 Anxiety disorder, unspecified: Secondary | ICD-10-CM | POA: Diagnosis not present

## 2020-01-21 HISTORY — DX: Hyperlipidemia, unspecified: E78.5

## 2020-01-21 HISTORY — PX: SUBMUCOSAL LIFTING INJECTION: SHX6855

## 2020-01-21 HISTORY — PX: ENDOSCOPIC MUCOSAL RESECTION: SHX6839

## 2020-01-21 HISTORY — DX: Personal history of other diseases of the circulatory system: Z86.79

## 2020-01-21 HISTORY — DX: Other seasonal allergic rhinitis: J30.2

## 2020-01-21 HISTORY — PX: HEMOSTASIS CLIP PLACEMENT: SHX6857

## 2020-01-21 HISTORY — PX: ESOPHAGOGASTRODUODENOSCOPY (EGD) WITH PROPOFOL: SHX5813

## 2020-01-21 LAB — I-STAT CHEM 8, ED
BUN: 39 mg/dL — ABNORMAL HIGH (ref 8–23)
Calcium, Ion: 1.03 mmol/L — ABNORMAL LOW (ref 1.15–1.40)
Chloride: 113 mmol/L — ABNORMAL HIGH (ref 98–111)
Creatinine, Ser: 3.3 mg/dL — ABNORMAL HIGH (ref 0.61–1.24)
Glucose, Bld: 185 mg/dL — ABNORMAL HIGH (ref 70–99)
HCT: 25 % — ABNORMAL LOW (ref 39.0–52.0)
Hemoglobin: 8.5 g/dL — ABNORMAL LOW (ref 13.0–17.0)
Potassium: 3.3 mmol/L — ABNORMAL LOW (ref 3.5–5.1)
Sodium: 142 mmol/L (ref 135–145)
TCO2: 16 mmol/L — ABNORMAL LOW (ref 22–32)

## 2020-01-21 LAB — COMPREHENSIVE METABOLIC PANEL
ALT: 16 U/L (ref 0–44)
AST: 20 U/L (ref 15–41)
Albumin: 3.3 g/dL — ABNORMAL LOW (ref 3.5–5.0)
Alkaline Phosphatase: 43 U/L (ref 38–126)
Anion gap: 17 — ABNORMAL HIGH (ref 5–15)
BUN: 42 mg/dL — ABNORMAL HIGH (ref 8–23)
CO2: 16 mmol/L — ABNORMAL LOW (ref 22–32)
Calcium: 8.6 mg/dL — ABNORMAL LOW (ref 8.9–10.3)
Chloride: 111 mmol/L (ref 98–111)
Creatinine, Ser: 3.32 mg/dL — ABNORMAL HIGH (ref 0.61–1.24)
GFR, Estimated: 16 mL/min — ABNORMAL LOW (ref >60)
Glucose, Bld: 189 mg/dL — ABNORMAL HIGH (ref 70–99)
Potassium: 3.3 mmol/L — ABNORMAL LOW (ref 3.5–5.1)
Sodium: 144 mmol/L (ref 135–145)
Total Bilirubin: 0.8 mg/dL (ref 0.3–1.2)
Total Protein: 5.9 g/dL — ABNORMAL LOW (ref 6.5–8.1)

## 2020-01-21 LAB — BRAIN NATRIURETIC PEPTIDE: B Natriuretic Peptide: 185.1 pg/mL — ABNORMAL HIGH (ref 0.0–100.0)

## 2020-01-21 LAB — CBC
HCT: 28.5 % — ABNORMAL LOW (ref 39.0–52.0)
Hemoglobin: 8.2 g/dL — ABNORMAL LOW (ref 13.0–17.0)
MCH: 25.7 pg — ABNORMAL LOW (ref 26.0–34.0)
MCHC: 28.8 g/dL — ABNORMAL LOW (ref 30.0–36.0)
MCV: 89.3 fL (ref 80.0–100.0)
Platelets: 200 10*3/uL (ref 150–400)
RBC: 3.19 MIL/uL — ABNORMAL LOW (ref 4.22–5.81)
RDW: 13.9 % (ref 11.5–15.5)
WBC: 7.7 10*3/uL (ref 4.0–10.5)
nRBC: 0 % (ref 0.0–0.2)

## 2020-01-21 LAB — APTT: aPTT: 28 seconds (ref 24–36)

## 2020-01-21 LAB — DIFFERENTIAL
Abs Immature Granulocytes: 0.02 10*3/uL (ref 0.00–0.07)
Basophils Absolute: 0 10*3/uL (ref 0.0–0.1)
Basophils Relative: 0 %
Eosinophils Absolute: 0 10*3/uL (ref 0.0–0.5)
Eosinophils Relative: 1 %
Immature Granulocytes: 0 %
Lymphocytes Relative: 25 %
Lymphs Abs: 2 10*3/uL (ref 0.7–4.0)
Monocytes Absolute: 0.2 10*3/uL (ref 0.1–1.0)
Monocytes Relative: 3 %
Neutro Abs: 5.5 10*3/uL (ref 1.7–7.7)
Neutrophils Relative %: 71 %

## 2020-01-21 LAB — CBG MONITORING, ED: Glucose-Capillary: 173 mg/dL — ABNORMAL HIGH (ref 70–99)

## 2020-01-21 LAB — PROTIME-INR
INR: 1.2 (ref 0.8–1.2)
Prothrombin Time: 14.3 seconds (ref 11.4–15.2)

## 2020-01-21 LAB — RESPIRATORY PANEL BY RT PCR (FLU A&B, COVID)
Influenza A by PCR: NEGATIVE
Influenza B by PCR: NEGATIVE
SARS Coronavirus 2 by RT PCR: NEGATIVE

## 2020-01-21 LAB — TROPONIN I (HIGH SENSITIVITY): Troponin I (High Sensitivity): 57 ng/L — ABNORMAL HIGH (ref <18)

## 2020-01-21 LAB — LACTIC ACID, PLASMA: Lactic Acid, Venous: 5.5 mmol/L (ref 0.5–1.9)

## 2020-01-21 SURGERY — ESOPHAGOGASTRODUODENOSCOPY (EGD) WITH PROPOFOL
Anesthesia: Monitor Anesthesia Care

## 2020-01-21 MED ORDER — SUCRALFATE 1 GM/10ML PO SUSP: 1.0000 g | Freq: Two times a day (BID) | ORAL | 1 refills | Status: AC

## 2020-01-21 MED ORDER — APIXABAN 5 MG PO TABS
5.0000 mg | ORAL_TABLET | Freq: Two times a day (BID) | ORAL | 0 refills | Status: DC
Start: 2020-01-24 — End: 2020-01-29

## 2020-01-21 MED ORDER — SODIUM CHLORIDE 0.9 % IV SOLN
3.0000 g | Freq: Once | INTRAVENOUS | Status: AC
  Administered 2020-01-21: 3 g via INTRAVENOUS
  Filled 2020-01-21: qty 3

## 2020-01-21 MED ORDER — SODIUM CHLORIDE 0.9% FLUSH
3.0000 mL | Freq: Once | INTRAVENOUS | Status: AC
  Administered 2020-01-21: 3 mL via INTRAVENOUS

## 2020-01-21 MED ORDER — PROPOFOL 500 MG/50ML IV EMUL
INTRAVENOUS | Status: DC | PRN
  Administered 2020-01-21 (×2): 70 ug/kg/min via INTRAVENOUS

## 2020-01-21 MED ORDER — FUROSEMIDE 10 MG/ML IJ SOLN
40.0000 mg | Freq: Once | INTRAMUSCULAR | Status: AC
  Administered 2020-01-21: 40 mg via INTRAVENOUS
  Filled 2020-01-21: qty 4

## 2020-01-21 MED ORDER — PROPOFOL 10 MG/ML IV BOLUS
INTRAVENOUS | Status: DC | PRN
  Administered 2020-01-21 (×2): 10 mg via INTRAVENOUS

## 2020-01-21 MED ORDER — OMEPRAZOLE 40 MG PO CPDR: 40.0000 mg | DELAYED_RELEASE_CAPSULE | Freq: Two times a day (BID) | ORAL | 4 refills | Status: AC

## 2020-01-21 MED ORDER — SODIUM CHLORIDE 0.9 % IV SOLN: INTRAVENOUS | Status: DC

## 2020-01-21 MED ORDER — DILTIAZEM HCL 25 MG/5ML IV SOLN
10.0000 mg | Freq: Once | INTRAVENOUS | Status: AC
  Administered 2020-01-21: 10 mg via INTRAVENOUS
  Filled 2020-01-21: qty 5

## 2020-01-21 MED ORDER — LACTATED RINGERS IV SOLN: INTRAVENOUS | Status: DC

## 2020-01-21 MED ORDER — LIDOCAINE 2% (20 MG/ML) 5 ML SYRINGE
INTRAMUSCULAR | Status: DC | PRN
  Administered 2020-01-21: 100 mg via INTRAVENOUS

## 2020-01-21 MED ORDER — ONDANSETRON HCL 4 MG/2ML IJ SOLN
INTRAMUSCULAR | Status: DC | PRN
  Administered 2020-01-21: 4 mg via INTRAVENOUS

## 2020-01-21 MED ORDER — STROKE: EARLY STAGES OF RECOVERY BOOK
Freq: Once | Status: DC
  Filled 2020-01-21: qty 1

## 2020-01-21 SURGICAL SUPPLY — 14 items

## 2020-01-21 NOTE — Op Note (Signed)
[facility] Patient Name: Todd [name] Procedure Date : 01/21/2020 MRN: [id number] Attending MD: Justice Britain , MD Date of Birth: [birth year] CSN: [id number] Age: 79 Admit Type: Inpatient Procedure:                Upper GI endoscopy Indications:              Polyps in the duodenum, For therapy of polyps in                            the duodenum, Gastric polyps, For therapy of                            gastric polyps Providers:                Justice Britain, MD, Jeanella Cara, RN,                            Ladona Ridgel, Technician Referring MD:             Somerset Borum Medicines:                Monitored Anesthesia Care Complications:            No immediate complications. Estimated Blood Loss:     Estimated blood loss was minimal. Procedure:                Pre-Anesthesia Assessment:                           - Prior to the procedure, a History and Physical                            was performed, and patient medications and                            allergies were reviewed. The patient's tolerance of                            previous anesthesia was also reviewed. The risks                            and benefits of the procedure and the sedation                            options and risks were discussed with the patient.                            All questions were answered, and informed consent                            was obtained. Prior Anticoagulants: The patient has                            taken Eliquis (apixaban), last dose was 2 days  prior to procedure. ASA Grade Assessment: III - A                            patient with severe systemic disease. After                            reviewing the risks and benefits, the patient was                            deemed in satisfactory condition to undergo the                            procedure.                           After  obtaining informed consent, the endoscope was                            passed under direct vision. Throughout the                            procedure, the patient's blood pressure, pulse, and                            oxygen saturations were monitored continuously. The                            [id number] ([id number]) Olympus therapeutic                            gastroscope was introduced through the mouth, and                            advanced to the second part of duodenum. The upper                            GI endoscopy was accomplished without difficulty.                            The patient tolerated the procedure. The TJF- Q180V                            ([id number]) Olympus duodenoscope was introduced                            through the mouth, and advanced to the antrum of                            the stomach. Scope In: Scope Out: Findings:      No gross lesions were noted in the entire esophagus.      The Z-line was irregular and was found 40 cm from the incisors.      Diffuse moderate mucosal changes characterized by altered texture were       found in the gastric body,  at the incisura and in the gastric antrum -       consistent with iron deposition hemosiderosis/melanosis.      A single 12 mm semi-sessile polyp with no bleeding and no stigmata of       recent bleeding was found on the anterior wall of the gastric antrum.       Preparations were made for mucosal resection. NBI imaging and       White-light endoscopy was done to demarcate the borders of the lesion.       Orise gel was injected to raise the lesion. Snare mucosal resection was       performed. Resection and retrieval were complete. To prevent bleeding       after mucosal resection, two hemostatic clips were successfully placed       (MR conditional) - I had to use the duodenoscope to get an angulation       that was appropriate for clip closure. There was no bleeding during, or       at the end, of  the procedure.      No other gross lesions were noted in the entire examined stomach.      A single 20 mm pedunculated polyp with no bleeding was found in the       duodenal bulb. Preparations were made for mucosal resection. NBI imaging       and White-light endoscopy was done to demarcate the borders of the       lesion. Orise gel was injected to raise the lesion. The snare was       positioned over the polyp. It was held in place for 90 seconds for       cinching purposes. Snare mucosal resection was performed. Resection and       retrieval were complete. There was evidence of a slight ooze from the       proximal margin of the resection. Coagulation for hemostasis using the       snare tip on soft coagulation settings was successful to stop oozing. To       prevent further bleeding after mucosal resection and since patient needs       to restart anticoagulation, five hemostatic clips were successfully       placed (MR conditional). There was no bleeding at the end of the       procedure.      The patient has evidence of iron deposition hemosiderosis/melanosis. But       no other gross lesions were noted in the duodenal bulb, in the first       portion of the duodenum and in the second portion of the duodenum. Impression:               - No gross lesions in esophagus. Z-line irregular,                            40 cm from the incisors.                           - Texture changed mucosa in the gastric body,                            incisura and antrum - hemosiderosis/melanosis.                           -  A single gastric polyp. Resected and retrieved                            via mucosal resection. Clips (MR conditional) were                            placed.                           - A single duodenal polyp. Resected and retrieved                            via mucosal resection. Slight ooze occured that was                            treated with snare tip soft coagulation.  Clips (MR                            conditional) were placed.                           - Texture changed mucosa (hemosiderosis/melanosis)                            noted but no other gross lesions in the duodenal                            bulb, in the first portion of the duodenum and in                            the second portion of the duodenum. Recommendation:           - The patient will be observed post-procedure,                            until all discharge criteria are met.                           - Discharge patient to home.                           - Patient has a contact number available for                            emergencies. The signs and symptoms of potential                            delayed complications were discussed with the                            patient. Return to normal activities tomorrow.                            Written discharge instructions were provided to the  patient.                           - Full liquid diet today.                           - Increase PPI to 40 mg twice daily for next                            69-month. Then may decrease back to 20 mg twice                            daily thereafter (already on).                           - Initiate Carafte 1 g twice daily for next 2-weeks.                           - May restart Eliquis in 72 hours (10/21 AM) to                            decrease risk of post-procedural bleeding.                           - Monitor for signs/symptoms of bleeding (dark                            black stools/vomiting blood or old blood),                            perforation, and infection. If issues please call                            our number to get further assistance as needed.                           - The findings and recommendations were discussed                            with the patient.                           - The findings and recommendations were  discussed                            with the patient's family. Procedure Code(s):        --- Professional ---                           [id number] Esophagogastroduodenoscopy, flexible,                            transoral; with endoscopic mucosal resection Diagnosis Code(s):        --- Professional ---  K22.8, Other specified diseases of esophagus                           K31.89, Other diseases of stomach and duodenum                           K31.7, Polyp of stomach and duodenum CPT copyright 2019 [employer]. All rights reserved. The codes documented in this report are preliminary and upon coder review may  be revised to meet current compliance requirements. Justice Britain, MD 01/21/2020 12:11:05 PM Number of Addenda: 0

## 2020-01-21 NOTE — Telephone Encounter (Signed)
Patty, thank you for letting me know. I have just called the patient's wife and got off the phone with them. She describes the patient not feeling well and having some lightheadedness and dizziness. Seems like he may have also had a fall and they helped him to his bed. Vital signs are low in regards to blood pressures and his heart rate has elevated. The EMS crew is now at the door and they are evaluating him now. He will likely be coming to [facility] or to [facility] since they live in [place]. I am placing the GI inpatient team here should there be any sort of issue stemming from today's procedure although I do not expect that to be the case certainly he could be at risk of bleeding but there was not any concern of that post procedure and this will be relatively quick. My hope is that this is a result of him being n.p.o after midnight but we will see how his numbers and labs look. Patty, please reach out to the patient tomorrow and let me know how things are going if he is not in the hospital otherwise send me a message and let me know that he is or I will see him on the list. Dr. Jonathon Resides in case you hear about my patient if there are any issues stemming from today's EGD with EMRs. Thanks. GM

## 2020-01-21 NOTE — ED Notes (Signed)
Admitting MD at bedside at this time.

## 2020-01-21 NOTE — Consult Note (Signed)
Neurology Consultation  Reason for Consult: Code stroke Referring Physician: Billy Fischer  CC: Altered mental status, hypotension, tachycardic  History is obtained from: EMS  HPI: Todd [name] is a 79 y.o. male with history of TIA, hypertension, hyperlipidemia, diabetes, atrial fibrillation who is on Eliquis however was held for 2 days for an endoscopy which was done this morning.  Patient did have endoscopy done and had gone home and felt lightheaded and dizzy.  Family states at 1430 he was last seen normal. EMS was called patient was found poorly responsive and tachycardic between 170-200.  His blood pressure systolically was down in the 70s.  EMS cardioverted him at 100 J and then a second time at 200 J.  Initially the EMS had called a stroke code for a blown right pupil however upon arriving to the ED, both pupils were equal round and reactive. He was noted to have cogwheeling in BL arms and legs, R worse than left. He is to see a neurologist outpatient for evaluation fo r dementia.   On exam, he started waking up more and was noted to be awake, alert, following commands with a drift in his R leg. CTH without contrast with no large territory infarct, no ICH. In the scanner, he was noted to become significantly hypoxic with sats down to mid 80s with lying flat. We did consider tPA specially with his stroke risk factors and R leg drift but given his significant hemodynamic instability, with tachycardia and hypotension and recent endoscopy, I was very concerned about a potential bleed that could worsen his hemodynamic instability and therefore held off on giving tPA.  He was not a candidate for thrombectomy due to low NIHSS of 2 and unable to lie flat without going hypoxic to 80s. He had to be put on 15L Oxygen via Nasal Cannula and was taken to the trauma room to establish IV access and for hemodynamic instability.  LKW: 1430 tpa given?: no, hemodynamically unstable, cocnern for potential bleeding  based on his significant tachycardia and hypotension. Premorbid modified Rankin scale (mRS): 0 Thrombectomy: Not a candidate due to low NIHSS of 2.  NIHSS: 2 LKW: 14:30 on 01/21/20  Past Medical History:  Diagnosis Date  . Anemia   . Asthma    history of asthma  . Atrial fibrillation ([place])   . Chronic kidney disease   . 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 GERD (gastroesophageal reflux disease)   . HLD (hyperlipidemia)   . Hypertension   . Seasonal allergies   . TIA (transient ischemic attack) 06/15/2017    Family History  Problem Relation Age of Onset  . Diabetes Mother   . Diabetes Father   . Stroke Father   . Colon cancer Father   . Diabetes Sister   . Stomach cancer Neg Hx   . Pancreatic cancer Neg Hx   . Esophageal cancer Neg Hx   . Inflammatory bowel disease Neg Hx   . Liver disease Neg Hx    Social History:   reports that he has never smoked. He has never used smokeless tobacco. He reports that he does not drink alcohol and does not use drugs.  Medications  Current Facility-Administered Medications:  .  sodium chloride flush (NS) 0.9 % injection 3 mL, 3 mL, Intravenous, Once, Long, Fenton Malling, MD  Current Outpatient Medications:  .  albuterol (PROVENTIL HFA;VENTOLIN HFA) 108 (90 Base) MCG/ACT inhaler, Inhale 2 puffs into the lungs every 6 (  six) hours as needed for wheezing or shortness of breath., Disp: , Rfl:  .  amLODipine (NORVASC) 10 MG tablet, Take 10 mg by mouth daily., Disp: , Rfl:  .  [START ON 01/24/2020] apixaban (ELIQUIS) 5 MG TABS tablet, Take 1 tablet (5 mg total) by mouth 2 (two) times daily., Disp: 60 tablet, Rfl: 0 .  atorvastatin (LIPITOR) 80 MG tablet, Take 1 tablet (80 mg total) by mouth daily at 6 PM., Disp: 30 tablet, Rfl: 0 .  calcitRIOL (ROCALTROL) 0.25 MCG capsule, Take 0.25 mcg by mouth daily., Disp: , Rfl:  .  cholecalciferol (VITAMIN D) 25 MCG (1000 UNIT) tablet, Take 1,000 Units by mouth daily., Disp: ,  Rfl:  .  docusate sodium (COLACE) 50 MG capsule, Take 100 mg by mouth daily as needed for mild constipation., Disp: , Rfl:  .  doxazosin (CARDURA) 8 MG tablet, Take 4-8 mg by mouth See admin instructions. Tale 8 mg in the morning and 4 mg at bedtime, Disp: , Rfl:  .  ferrous sulfate 325 (65 FE) MG tablet, Take 325 mg by mouth daily. , Disp: , Rfl:  .  finasteride (PROSCAR) 5 MG tablet, Take 5 mg by mouth daily., Disp: , Rfl:  .  fluticasone (FLONASE) 50 MCG/ACT nasal spray, Place 2 sprays into both nostrils daily as needed for allergies or rhinitis., Disp: , Rfl:  .  furosemide (LASIX) 40 MG tablet, Take 40 mg by mouth See admin instructions. Take 40 mg daily in the morning  Take additional 40 mg at bedtime on Mon., Wed., and friday, Disp: , Rfl:  .  hydrALAZINE (APRESOLINE) 100 MG tablet, Take 100 mg by mouth 3 (three) times daily. , Disp: , Rfl:  .  lactulose (CHRONULAC) 10 GM/15ML solution, Take 10 g by mouth daily as needed for mild constipation or moderate constipation., Disp: , Rfl:  .  loratadine (CLARITIN) 10 MG tablet, Take 10 mg by mouth at bedtime. , Disp: , Rfl:  .  losartan (COZAAR) 100 MG tablet, Take 100 mg by mouth daily., Disp: , Rfl:  .  NIFEdipine (ADALAT CC) 60 MG 24 hr tablet, Take 60 mg by mouth daily., Disp: , Rfl:  .  omeprazole (PRILOSEC) 40 MG capsule, Take 1 capsule (40 mg total) by mouth 2 (two) times daily before a meal., Disp: 60 capsule, Rfl: 4 .  polycarbophil (FIBERCON) 625 MG tablet, Take 625 mg by mouth daily., Disp: , Rfl:  .  sennosides-docusate sodium (SENOKOT-S) 8.6-50 MG tablet, Take 1 tablet by mouth 2 (two) times daily as needed for constipation., Disp: , Rfl:  .  Sodium Phosphates (RA SALINE ENEMA RE), Place 1 application rectally as needed (take every other day as needed for constipation)., Disp: , Rfl:  .  sucralfate (CARAFATE) 1 GM/10ML suspension, Take 10 mLs (1 g total) by mouth 2 (two) times daily. Take once during the day and before bedtime.  Try to  no take any other medications 1 hour before or after use of Carafate., Disp: 420 mL, Rfl: 1 .  vitamin B-12 (CYANOCOBALAMIN) 500 MCG tablet, Take 1,000 mcg by mouth daily., Disp: , Rfl:   ROS:  Unable to obtain due to altered mental status.    Exam: Current vital signs: There were no vitals taken for this visit. Vital signs in last 24 hours: Temp:  [97.7 F (36.5 C)-97.9 F (36.6 C)] 97.9 F (36.6 C) (10/18 1200) Pulse Rate:  [64-81] 72 (10/18 1250) Resp:  [10-18] 18 (10/18 1250) BP: (92-170)/(40-54)  118/47 (10/18 1250) SpO2:  [91 %-100 %] 92 % (10/18 1250)   Constitutional: Appears well-developed and well-nourished.  Psych: Affect appropriate to situation Eyes: No scleral injection HENT: No OP obstrucion Head: Normocephalic.  Cardiovascular: Palpable Respiratory: Dyspnea with shortness of breath GI: Soft.  No distension. There is no tenderness.  Skin: WDI  Neuro: Mental Status: Patient is awake, alert, oriented to person, place, month Patient shows no aphasia or dysarthria Patient is able to follow commands the best of his ability Cranial Nerves: II: Visual Fields are full.  III,IV, VI: EOMI without ptosis or diploplia. Pupils equal, round and reactive to light V: Facial sensation is symmetric to temperature VII: Facial movement is symmetric.  VIII: hearing is intact to voice XI: Shoulder shrug is symmetric. Motor: Patient is moving all extremities antigravity with the exception of his right leg which drifts to the bed.  Patient has increased tone on his right arm and right leg.  He is also trembling with his right arm. Sensory: Sensation is symmetric to light touch and temperature in the arms and legs. Deep Tendon Reflexes: 2+ all over and symmetric. Cerebellar: FNF intact  Labs I have reviewed labs in epic and the results pertinent to this consultation are:  CBC    Component Value Date/Time   WBC 4.4 06/04/2019 1418   RBC 3.36 (L) 06/04/2019 1418   HGB  8.5 (L) 01/21/2020 1619   HCT 25.0 (L) 01/21/2020 1619   PLT 188.0 06/04/2019 1418   MCV 82.5 06/04/2019 1418   MCH 26.4 06/20/2017 0915   MCHC 33.8 06/04/2019 1418   RDW 14.2 06/04/2019 1418   LYMPHSABS 1.2 06/04/2019 1418   MONOABS 0.3 06/04/2019 1418   EOSABS 0.2 06/04/2019 1418   BASOSABS 0.0 06/04/2019 1418    CMP     Component Value Date/Time   NA 142 01/21/2020 1619   K 3.3 (L) 01/21/2020 1619   CL 113 (H) 01/21/2020 1619   CO2 23 06/20/2017 0915   GLUCOSE 185 (H) 01/21/2020 1619   BUN 39 (H) 01/21/2020 1619   CREATININE 3.30 (H) 01/21/2020 1619   CALCIUM 8.6 (L) 06/20/2017 0915   PROT 7.2 06/15/2017 2153   ALBUMIN 3.5 06/15/2017 2153   AST 16 06/15/2017 2153   ALT 14 (L) 06/15/2017 2153   ALKPHOS 47 06/15/2017 2153   BILITOT 0.9 06/15/2017 2153   GFRNONAA 21 (L) 06/20/2017 0915   GFRAA 24 (L) 06/20/2017 0915    Lipid Panel     Component Value Date/Time   CHOL 320 (H) 06/16/2017 0724   TRIG 280 (H) 06/16/2017 0724   HDL 57 06/16/2017 0724   CHOLHDL 5.6 06/16/2017 0724   VLDL 56 (H) 06/16/2017 0724   LDLCALC 207 (H) 06/16/2017 0724     Imaging I have reviewed the images obtained:  CT-scan of the brain-shows no acute intracranial abnormality with aspects of 10   01/21/2020, 4:27 PM   Assessment:  This is a 79 year old male with stroke risk factors including Afibb on Eliquis (held for 2 days prior to his endoscopy today), hx of DM2, HTN, HLD, CKD who was brought in to the ED by EMS as a stroke code with tachycardia, hypotension, tachypnea, R leg weakness. Initial concern for a blown R pupil by EMS but not noted in the ED. Code stroke workup had to be aborted 2/2 desaturations to 80s with persistent tachycardia and hypotension. Initial CTH with no ICH. tPA was not offered 2/2 significant hemodynamic instability and some concern for  a potential systemic bleed. He was not a candidate for thrombectomy 2/2 low NIHSS of 2.  I do have clinical suspicion that he  has a stroke with his acute onset R leg drift that per him and wife is new.  Impression: - Right leg weakness possibly from stroke - Tachycardia - Hypotension - Respiratory distress  Recommendations: Plan: - Recommend brain imaging with MRI Brain without contrast - Recommend Vascular imaging with MRA Angio Head without contrast and US Carotid doppler - Recommend obtaining TTE - Recommend obtaining Lipid panel with LDL - Please start statin if LDL > 70 - Recommend HbA1c - Antithrombotic - aspirin 81mg  daily if no concern for systemic bleeding. - Recommend DVT ppx - SBP goal - permissive hypertension first 24 h < 220/110. Hold home meds.  - Recommend Telemetry monitoring for arrythmia - Recommend bedside swallow screen prior to PO intake. - Stroke education booklet - Recommend PT/OT/SLP consult  [place] Pager Number [phone]

## 2020-01-21 NOTE — Telephone Encounter (Signed)
I have called the patient's wife again. They have taken the patient via EMS and will be taking him to [facility] for further evaluation. With not completely clear what was occurring although the patient continued to have lower blood pressures in the 51G over 33P certainly this requires additional work-up. Patient's wife appreciative for the call back. Thank you.  GM

## 2020-01-21 NOTE — Discharge Instructions (Signed)
YOU HAD AN ENDOSCOPIC PROCEDURE TODAY: Refer to the procedure report and other information in the discharge instructions given to you for any specific questions about what was found during the examination. If this information does not answer your questions, please call [place] office at [phone] to clarify.   YOU SHOULD EXPECT: Some feelings of bloating in the abdomen. Passage of more gas than usual. Walking can help get rid of the air that was put into your GI tract during the procedure and reduce the bloating. If you had a lower endoscopy (such as a colonoscopy or flexible sigmoidoscopy) you may notice spotting of blood in your stool or on the toilet paper. Some abdominal soreness may be present for a day or two, also.  DIET: Your first meal following the procedure should be a light meal and then it is ok to progress to your normal diet. A half-sandwich or bowl of soup is an example of a good first meal. Heavy or fried foods are harder to digest and may make you feel nauseous or bloated. Drink plenty of fluids but you should avoid alcoholic beverages for 24 hours. If you had a esophageal dilation, please see attached instructions for diet.    ACTIVITY: Your care partner should take you home directly after the procedure. You should plan to take it easy, moving slowly for the rest of the day. You can resume normal activity the day after the procedure however YOU SHOULD NOT DRIVE, use power tools, machinery or perform tasks that involve climbing or major physical exertion for 24 hours (because of the sedation medicines used during the test).   SYMPTOMS TO REPORT IMMEDIATELY: A gastroenterologist can be reached at any hour. Please call [phone]  for any of the following symptoms:   Following upper endoscopy (EGD, EUS, ERCP, esophageal dilation) Vomiting of blood or coffee ground material  New, significant abdominal pain  New, significant chest pain or pain under the shoulder blades  Painful or  persistently difficult swallowing  New shortness of breath  Black, tarry-looking or red, bloody stools  FOLLOW UP:  If any biopsies were taken you will be contacted by phone or by letter within the next 1-3 weeks. Call [phone]  if you have not heard about the biopsies in 3 weeks.  Please also call with any specific questions about appointments or follow up tests.  

## 2020-01-21 NOTE — ED Notes (Signed)
Pt remains in MRI at this time  

## 2020-01-21 NOTE — Telephone Encounter (Signed)
Thank you for the information.

## 2020-01-21 NOTE — Telephone Encounter (Signed)
Patients wife called states the patient is not well since he's been home from the procedure. He has spit up a lot of mucus and his BP is going down from 102-48 to 95 now it is 83/44 please advise

## 2020-01-21 NOTE — H&P (Addendum)
[facility] Admission History and Physical Service Pager: [phone]  Patient name: Todd [name] Medical record number: [id number] Date of birth: 03-27-1941 Age: 79 y.o. Gender: male  Primary Care Provider: Charlsie Merles, MD Consultants: Neurology Code Status: FULL  Preferred Emergency Contact: Brandon Melnick [phone], landline [phone]   Chief Complaint: Code stroke  Assessment and Plan: Todd [name] is a 79 y.o. male presenting with altered mental status and blown pupils shortly after EGD concerning for acute CVA and acute hypoxemic respiratory failure likely secondary to pneumonia. PMH is significant for paroxysmal A Fib (on apixaban), TIA, T2DM, HTN, HLD, CKD, GERD, iron deficiency anemia.  Acute hypoxemic respiratory failure  possible aspiration pneumonia Patient underwent EGD today due to gastric/duodenal polyps, then started feeling generally weak within an hour of returning home and 1 hour later slumped to the ground and was found unresponsive with agonal breathing.  At that time, patient was tachycardic between 170-200 and hypotensive with SBP in the 70s.  EMS attempted cardioversion x2 without any change.  Upon arrival, NRB was placed due to agonal breathing and was titrated up to 15 L due to hypoxemia with SpO2 84%.  CXR obtained with marked left-sided infiltrate concerning for pneumonia.  CT chest was also obtained with findings consistent with diffuse left-sided pneumonia.  Top differential is aspiration pneumonia as pt underwent EGD today. Patient received a dose of ampicillin-sulbactam in the ED due to concern for aspiration pneumonia.  Respiratory status has improved since presentation, he is now saturating well on room air. It is reassuring that patient has remained afebrile and without significant cough or leukocytosis. 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 fib, which was discontinued 2 months ago due  to abnormal PFTs), though would expect more interstitial pattern indicating pulmonary fibrosis and more chronic symptoms. Also considered PE causing acute hypoxia and hemodynamic instability as pt has been off anticoagulation for>3 days. Wells score for PE 4.5 (moderate risk), denies chest pain and hypoxia improving with antibiotic administration. - admit to FPTS, progressive, Dr. 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 Blown pupil was not noted in the ED. Neurology was consulted in the ED.  On their evaluation, he was noted to be more awake and alert, following commands and with a drift in his right leg.  CT head without contrast without evidence of ICH.  tPA was considered, but was not given due to concern about potential bleed that could worsen his hemodynamic instability.  He was also not a candidate for thrombectomy due to low NIHSS score of 2 as well as inability to lie flat due to hypoxia.  On my exam, he does have weakness in his right lower extremity (unable to lift leg against gravity), otherwise unremarkable neuro exam.  MRI of the brain did reveal potential subtle acute ischemic infarct in the ventral left medulla versus artifact. Neurology will continue to follow before making a diagnosis of CVA. MRI negative for large vessel occlusion. Of note, patient's  anticoagulation was held for a few days for procedure today (wife states his last dose of apixaban was this past Friday, 3 days ago).  Also has history of TIA 06/2017. Stroke swallow  screen passed in the ED. - neurology following, appreciate involvement -Monitor limb weakness with serial exams - ASA 81 mg  - BP goal-normotension per neurology -Holding all home anti-hypertensives - US Carotid doppler - F/u TEE - Lipid panel, HbA1c  A Fib with RVR A fib in RVR to 130 in the ED, now rate controlled s/p 10 mg IV diltiazem. Tachycardic up to 200 prior to arrival s/p cardioversions x2 without significant change.  Home meds: Eliquis 5mg  BID , held today due to EGD today.  Amiodarone was discontinued 2 months ago due to abnormal PFTs. Does not appear to be on rate control. Cardiology consulted in the ED, they will plan to see patient in the morning. Most recent EKG this morning shows sinus rhythm. - cardiology following, appreciate recommendations - restart home apixaban 5mg  BID given pt has been off anticoagulation for >3 days and risk of stroke outweighs risk of acute hemorrhage - cardiac monitoring  Anion gap metabolic acidosis Elevated lactate 5.5>1.3. with anion gap metabolic acidosis (bicarb 16, AG 17) - trend lactate - mIVF fluids LR 167ml/hr x 12h  Hypokalemia K 3.3 on admission.  Repleted with KCl 40 mEq  -Monitor with daily BMP -Replete as necessary   HTN  SBP 130-170 Home meds: amlodipine 10 mg, doxazosin (8 mg AM, 4 mg bedtime), hydralazine 100 mg TID, losartan 100 mg, nifedipine 60 mg. 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 On examination patient has no crackles and 1+ peripheral pitting edema. BNP 185.1. Home meds: furosemide 40 mg MWF. S/p IV furosemide 40 mg in the ED. - holding furosemide (permissive HTN) - f/u TTE - strict I/O - daily weights  HLD Home meds: atorvastatin 80 mg. -Atorvastatin 80mg  once daily -Follow up lipid panel   Asthma Home meds: albuterol prn - Continue albuterol prn   Diabetes Last A1c 7.6 in 2019.  Not on any antihyperglycemic  agents. - f/u HbA1c -CBGs Q4H -CBG goal 140-180 -Start sSSI if needed  AKI on CKD Cr 3.30>3.42.  Baseline appears to be around 2.6-2.7. Follows [facility] nephrology at [facility] in [place]. - monitor with BMP -Continue mIVF -Avoid nephrotoxic agents  -Nephrology consult am   Normocytic anemia Hb 8.5>7.7 on admission. Home meds: ferrous sulfate 325 mg daily, vitamin B12 1000 mcg daily. - restart ferrous sulphate 325mg  daily - Clarify transfusion threshold   GERD Home meds: omeprazole 40 mg BID, sucralfate 1 g BID. - Continue pantoprazole 40mg  per formulary  BPH Home meds: doxazosin (8 mg AM, 4 mg PM), finasteride 5 mg. - holding both doxazosin and finasteride (can cause hypotension)   FEN/GI: heart healthy diet, LR @ 125 ml/hr x 12 hr Prophylaxis: apixaban  Disposition: progressive  History of Present Illness:  Todd [name] is a 78 y.o. male presenting with syncope.   Had EGD today for duodenal and gastric ulcers detected last year. Removed 2 large polyps from stomach and small intestine today, EGD was at 10am. They held eliquis prior to procedure, last dose Friday. Within 1 hour of returning home, patient was feeling generally weak. Wife thought it was due to anesthesia. Sat him down at home. He went up the stairs, and he then was awake but not responsive, wife took BP  it was 02/63, HR 87 systolic. She called Dr Angelena Sole and called 911.   When EMS arrived, patient was unresponsive with right pupil blown, so there was concerns for stroke. Blown pupil not noted in the ED.   Wife states patient has been more depressed and no longer doing the things he enjoyed doing ever since his daughter passed away.   Fully vaccinated, received the Moderna vaccine. No sick contacts.    Review Of Systems: Per HPI with the following additions:   Review of Systems  Respiratory: Negative for cough, chest tightness and shortness of breath.   Cardiovascular: Negative for chest pain and  palpitations.  Gastrointestinal: Negative for abdominal pain, blood in stool, diarrhea, nausea and vomiting.  Genitourinary: Negative for dysuria, frequency and hematuria.  Neurological: Positive for syncope. Negative for dizziness and light-headedness.     Patient Active Problem List   Diagnosis Date Noted  . Stroke ([place]) 01/21/2020  . Normocytic anemia 06/14/2019  . Hyperplastic polyp of stomach 06/14/2019  . Polyp of small intestine 06/14/2019  . Hx of adenomatous colonic polyps 06/14/2019  . GERD (gastroesophageal reflux disease)   . Iron deficiency anemia 03/21/2019  . Atrial fibrillation ([place]) 06/17/2017  . TIA (transient ischemic attack) 06/16/2017  . Diabetes mellitus without complication ([place])   . Hypertension   . Chronic kidney disease   . Abdominal pain, generalized 10/17/2014    Past Medical History: Past Medical History:  Diagnosis Date  . Anemia   . Asthma    history of asthma  . Atrial fibrillation ([place])   . Chronic kidney disease   . Diabetes mellitus without complication ([place])    type 2 - no meds  . GERD (gastroesophageal reflux disease)   . HLD (hyperlipidemia)   . Hypertension   . Seasonal allergies   . TIA (transient ischemic attack) 06/15/2017    Past Surgical History: Past Surgical History:  Procedure Laterality Date  . COLONOSCOPY WITH ESOPHAGOGASTRODUODENOSCOPY (EGD)  04/2019  . ESOPHAGOGASTRODUODENOSCOPY N/A 10/17/2014   Procedure: ESOPHAGOGASTRODUODENOSCOPY (EGD);  Surgeon: Wilford Corner, MD;  Location: [facility] ENDOSCOPY;  Service: Endoscopy;  Laterality: N/A;  . multiple tooth implants     pt thinks sedation    Social History: Social History   Tobacco Use  . Smoking status: Never Smoker  . Smokeless tobacco: Never Used  Vaping Use  . Vaping Use: Never used  Substance Use Topics  . Alcohol use: No  . Drug use: No   Additional social history: Denies smoking, alcohol, recreational drug use. Please also refer to relevant sections of  EMR.  Family History: Family History  Problem Relation Age of Onset  . Diabetes Mother   . Diabetes Father   . Stroke Father   . Colon cancer Father   . Diabetes Sister   . Stomach cancer Neg Hx   . Pancreatic cancer Neg Hx   . Esophageal cancer Neg Hx   . Inflammatory bowel disease Neg Hx   . Liver disease Neg Hx     Allergies and Medications: Allergies  Allergen Reactions  . Lisinopril Cough  . Metformin And Related Other (See Comments)    chronic kidney disease   No current facility-administered medications on file prior to encounter.   Current Outpatient Medications on File Prior to Encounter  Medication Sig Dispense Refill  . albuterol (PROVENTIL HFA;VENTOLIN HFA) 108 (90 Base) MCG/ACT inhaler Inhale 2 puffs into the lungs every 6 (six) hours as needed for wheezing or shortness of breath.    Marland Kitchen  amLODipine (NORVASC) 10 MG tablet Take 10 mg by mouth daily.    Derrill Memo ON 01/24/2020] apixaban (ELIQUIS) 5 MG TABS tablet Take 1 tablet (5 mg total) by mouth 2 (two) times daily. 60 tablet 0  . atorvastatin (LIPITOR) 80 MG tablet Take 1 tablet (80 mg total) by mouth daily at 6 PM. 30 tablet 0  . calcitRIOL (ROCALTROL) 0.25 MCG capsule Take 0.25 mcg by mouth daily.    . cholecalciferol (VITAMIN D) 25 MCG (1000 UNIT) tablet Take 1,000 Units by mouth daily.    Marland Kitchen docusate sodium (COLACE) 50 MG capsule Take 100 mg by mouth daily as needed for mild constipation.    Marland Kitchen doxazosin (CARDURA) 8 MG tablet Take 4-8 mg by mouth See admin instructions. Tale 8 mg in the morning and 4 mg at bedtime    . ferrous sulfate 325 (65 FE) MG tablet Take 325 mg by mouth daily.     . finasteride (PROSCAR) 5 MG tablet Take 5 mg by mouth daily.    . fluticasone (FLONASE) 50 MCG/ACT nasal spray Place 2 sprays into both nostrils daily as needed for allergies or rhinitis.    . furosemide (LASIX) 40 MG tablet Take 40 mg by mouth See admin instructions. Take 40 mg by mouth daily in the morning  Take additional 40  mg by mouth at bedtime on Mon., Wed., and friday    . hydrALAZINE (APRESOLINE) 100 MG tablet Take 100 mg by mouth 3 (three) times daily.     Marland Kitchen lactulose (CHRONULAC) 10 GM/15ML solution Take 10 g by mouth daily as needed for mild constipation or moderate constipation.    Marland Kitchen loratadine (CLARITIN) 10 MG tablet Take 10 mg by mouth at bedtime.     Marland Kitchen losartan (COZAAR) 100 MG tablet Take 100 mg by mouth daily.    Marland Kitchen NIFEdipine (ADALAT CC) 60 MG 24 hr tablet Take 60 mg by mouth daily.    Marland Kitchen omeprazole (PRILOSEC) 40 MG capsule Take 1 capsule (40 mg total) by mouth 2 (two) times daily before a meal. 60 capsule 4  . polycarbophil (FIBERCON) 625 MG tablet Take 625 mg by mouth daily.    . sennosides-docusate sodium (SENOKOT-S) 8.6-50 MG tablet Take 1 tablet by mouth 2 (two) times daily as needed for constipation.    . Sodium Phosphates (RA SALINE ENEMA RE) Place 1 application rectally as needed (take every other day as needed for constipation).    . sucralfate (CARAFATE) 1 GM/10ML suspension Take 10 mLs (1 g total) by mouth 2 (two) times daily. Take once during the day and before bedtime.  Try to no take any other medications 1 hour before or after use of Carafate. 420 mL 1  . vitamin B-12 (CYANOCOBALAMIN) 500 MCG tablet Take 1,000 mcg by mouth daily.      Objective: BP (!) 146/68   Pulse (!) 101   Temp 98.7 F (37.1 C) (Temporal)   Resp (!) 24   SpO2 99%  Exam: General: 79 yo AA male, appears stated age, somnolent but interactive, overweight elderly male, NAD Eyes: PERRL, EOMI, no scleral icterus ENTM: MMM Neck: supple, no LAD, no thyromegaly Cardiovascular: Normal rate, irregularly irregular rhythm, no murmurs, 1+ pitting edema Respiratory: CTAB, poor air entry bilaterally, no crackles or wheeze  Gastrointestinal: soft, non-tender, +BS MSK: no obvious joint deformities Derm: warm and dry, no rash Neuro: A&Ox3, CN II-XII intact, 5/5 strength bilateral upper extremities and left lower extremity,  unable to lift right leg against gravity  Psych: affect blunted  Labs and Imaging: CBC BMET  Recent Labs  Lab 01/21/20 1620  WBC 7.7  HGB 8.2*  HCT 28.5*  PLT 200   Recent Labs  Lab 01/21/20 1620  NA 144  K 3.3*  CL 111  CO2 16*  BUN 42*  CREATININE 3.32*  GLUCOSE 189*  CALCIUM 8.6*     EKG: My own interpretation (not copied from electronic read) A Fib with RVR   Zola Button, MD 01/22/2020, 1:31 AM PGY-1, [place] Intern pager: [phone], text pages welcome  FPTS Upper-Level Resident Addendum   I have independently interviewed and examined the patient. I have discussed the above with the original author and agree with their documentation. My edits for correction/addition/clarification are in blue. Please see also any attending notes.   Lattie Haw MD PGY-2, [facility] 01/22/2020 5:02 AM  FPTS Service pager: [phone] (text pages welcome through [facility])

## 2020-01-21 NOTE — Telephone Encounter (Signed)
The pt wife is calling for the pt stating his BP has dropped to 70/44 after procedure today. He does not respond when spoken to.  I have advised the wife to call 911 and will notify Dr Rush Landmark

## 2020-01-21 NOTE — H&P (Signed)
GASTROENTEROLOGY PROCEDURE H&P NOTE   Primary Care Physician: Charlsie Merles, MD  HPI: Todd [name] is a 79 y.o. male who presents for EGD with possible EMR of Gastric and Duodenal Polyps (hyperplastic on pathology but >1 cm in size each).  Past Medical History:  Diagnosis Date  . Anemia   . Asthma    history of asthma  . Atrial fibrillation ([place])   . Chronic kidney disease   . Diabetes mellitus without complication ([place])    type 2 - no meds  . GERD (gastroesophageal reflux disease)   . HLD (hyperlipidemia)   . Hypertension   . Seasonal allergies   . TIA (transient ischemic attack) 06/15/2017   Past Surgical History:  Procedure Laterality Date  . COLONOSCOPY WITH ESOPHAGOGASTRODUODENOSCOPY (EGD)  04/2019  . ESOPHAGOGASTRODUODENOSCOPY N/A 10/17/2014   Procedure: ESOPHAGOGASTRODUODENOSCOPY (EGD);  Surgeon: Wilford Corner, MD;  Location: [facility] ENDOSCOPY;  Service: Endoscopy;  Laterality: N/A;  . multiple tooth implants     pt thinks sedation   Current Facility-Administered Medications  Medication Dose Route Frequency Provider Last Rate Last Admin  . 0.9 %  sodium chloride infusion   Intravenous Continuous Mansouraty, Telford Nab., MD      . lactated ringers infusion   Intravenous Continuous Mansouraty, Telford Nab., MD       Allergies  Allergen Reactions  . Lisinopril Cough  . Metformin And Related Other (See Comments)    chronic kidney disease   Family History  Problem Relation Age of Onset  . Diabetes Mother   . Diabetes Father   . Stroke Father   . Colon cancer Father   . Diabetes Sister   . Stomach cancer Neg Hx   . Pancreatic cancer Neg Hx   . Esophageal cancer Neg Hx   . Inflammatory bowel disease Neg Hx   . Liver disease Neg Hx    Social History   Socioeconomic History  . Marital status: Married    Spouse name: Not on file  . Number of children: 2  . Years of education: Not on file  . Highest education level: Not on file  Occupational History  .  Occupation: retired  Tobacco Use  . Smoking status: Never Smoker  . Smokeless tobacco: Never Used  Vaping Use  . Vaping Use: Never used  Substance and Sexual Activity  . Alcohol use: No  . Drug use: No  . Sexual activity: Not Currently    Partners: Female  Other Topics Concern  . Not on file  Social History Narrative  . Not on file   Social Determinants of Health   Financial Resource Strain:   . Difficulty of Paying Living Expenses: Not on file  Food Insecurity:   . Worried About Charity fundraiser in the Last Year: Not on file  . Ran Out of Food in the Last Year: Not on file  Transportation Needs:   . Lack of Transportation (Medical): Not on file  . Lack of Transportation (Non-Medical): Not on file  Physical Activity:   . Days of Exercise per Week: Not on file  . Minutes of Exercise per Session: Not on file  Stress:   . Feeling of Stress : Not on file  Social Connections:   . Frequency of Communication with Friends and Family: Not on file  . Frequency of Social Gatherings with Friends and Family: Not on file  . Attends Religious Services: Not on file  . Active Member of Clubs or Organizations: Not on file  .  Attends Archivist Meetings: Not on file  . Marital Status: Not on file  Intimate Partner Violence:   . Fear of Current or Ex-Partner: Not on file  . Emotionally Abused: Not on file  . Physically Abused: Not on file  . Sexually Abused: Not on file    Physical Exam: Vital signs in last 24 hours: Pulse Rate:  [81] 81 (10/18 0928) Resp:  [10] 10 (10/18 0928) BP: (170)/(54) 170/54 (10/18 0928) SpO2:  [100 %] 100 % (10/18 0928)   GEN: NAD EYE: Sclerae anicteric ENT: MMM CV: Non-tachycardic GI: Soft, NT/ND NEURO:  Alert & Oriented x 3  Lab Results: No results for input(s): WBC, HGB, HCT, PLT in the last 72 hours. BMET No results for input(s): NA, K, CL, CO2, GLUCOSE, BUN, CREATININE, CALCIUM in the last 72 hours. LFT No results for input(s):  PROT, ALBUMIN, AST, ALT, ALKPHOS, BILITOT, BILIDIR, IBILI in the last 72 hours. PT/INR No results for input(s): LABPROT, INR in the last 72 hours.   Impression / Plan: This is a 79 y.o.male who presents for EGD with possible EMR of Gastric and Duodenal Polyps (hyperplastic on pathology but >1 cm in size each).  The risks and benefits of endoscopic evaluation were discussed with the patient; these include but are not limited to the risk of perforation, infection, bleeding, missed lesions, lack of diagnosis, severe illness requiring hospitalization, as well as anesthesia and sedation related illnesses.  The patient is agreeable to proceed.    Justice Britain, MD [facility] Advanced Endoscopy Office # [phone]

## 2020-01-21 NOTE — ED Provider Notes (Signed)
[facility] EMERGENCY DEPARTMENT Provider Note   CSN: [id number] Arrival date & time: 01/21/20  1612  An emergency department physician performed an initial assessment on this suspected stroke patient at 1615.  History Chief Complaint  Patient presents with  . Code Stroke    Todd [name] is a 79 y.o. male with a history of paroxysmal atrial fibrillation typically anticoagulated on Eliquis (likely held prior to procedure today), hypertension, asthma, diabetes mellitus, CKD, anemia, & TIA who presents to the ED as a code stroke.   History is provided by the patient, EMS, & chart review.  Patient had an upper endoscopy procedure by Dr. Rush Landmark due to gastric/duodenal polyps, was discharged, and shortly after arriving home he began to feel lightheaded/dizzy and generally not well which prompted his wife to call 911. His last known normal was 14:30 per family. Per EMS upon arrival patient was unresponsive with agonal breathing, HR in the 190s-200s, unclear rhythm, BP 97W systolic, administered 263 J synchronized w/o change & subsequent 200 J synchronized without change. NRB applied due to agonal breathing on arrival. Upon further inspection they noted a blown R pupil and activated a code stroke. Patient denies any current pain, he does report to me that he felt dizzy, lightheaded and generally weak upon returning home, he does not think he passed out but is unsure what happened between starting to feel poorly & hospital arrival. Level 5 caveat applies secondary to acuity of condition.    HPI     Past Medical History:  Diagnosis Date  . Anemia   . Asthma    history of asthma  . Atrial fibrillation ([place])   . Chronic kidney disease   . 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 GERD (gastroesophageal reflux disease)   . HLD (hyperlipidemia)   . Hypertension   . Seasonal allergies   . TIA (transient ischemic attack) 06/15/2017    Patient  Active Problem List   Diagnosis Date Noted  . Normocytic anemia 06/14/2019  . Hyperplastic polyp of stomach 06/14/2019  . Polyp of small intestine 06/14/2019  . Hx of adenomatous colonic polyps 06/14/2019  . GERD (gastroesophageal reflux disease)   . Iron deficiency anemia 03/21/2019  . Atrial fibrillation ([place]) 06/17/2017  . TIA (transient ischemic attack) 06/16/2017  . Diabetes mellitus without complication ([place])   . Hypertension   . Chronic kidney disease   . Abdominal pain, generalized 10/17/2014    Past Surgical History:  Procedure Laterality Date  . COLONOSCOPY WITH ESOPHAGOGASTRODUODENOSCOPY (EGD)  04/2019  . ESOPHAGOGASTRODUODENOSCOPY N/A 10/17/2014   Procedure: ESOPHAGOGASTRODUODENOSCOPY (EGD);  Surgeon: Wilford Corner, MD;  Location: [facility] ENDOSCOPY;  Service: Endoscopy;  Laterality: N/A;  . multiple tooth implants     pt thinks sedation       Family History  Problem Relation Age of Onset  . Diabetes Mother   . Diabetes Father   . Stroke Father   . Colon cancer Father   . Diabetes Sister   . Stomach cancer Neg Hx   . Pancreatic cancer Neg Hx   . Esophageal cancer Neg Hx   . Inflammatory bowel disease Neg Hx   . Liver disease Neg Hx     Social History   Tobacco Use  . Smoking status: Never Smoker  . Smokeless tobacco: Never Used  Vaping Use  . Vaping Use: Never used  Substance Use Topics  . Alcohol use: No  . Drug use: No  Home Medications Prior to Admission medications   Medication Sig Start Date End Date Taking? Authorizing Provider  albuterol (PROVENTIL HFA;VENTOLIN HFA) 108 (90 Base) MCG/ACT inhaler Inhale 2 puffs into the lungs every 6 (six) hours as needed for wheezing or shortness of breath.    [provider]  amLODipine (NORVASC) 10 MG tablet Take 10 mg by mouth daily.    [provider]  apixaban (ELIQUIS) 5 MG TABS tablet Take 1 tablet (5 mg total) by mouth 2 (two) times daily. 01/24/20   Mansouraty, Telford Nab., MD    atorvastatin (LIPITOR) 80 MG tablet Take 1 tablet (80 mg total) by mouth daily at 6 PM. 06/20/17   Santos-Sanchez, Merlene Morse, MD  calcitRIOL (ROCALTROL) 0.25 MCG capsule Take 0.25 mcg by mouth daily.    [provider]  cholecalciferol (VITAMIN D) 25 MCG (1000 UNIT) tablet Take 1,000 Units by mouth daily.    [provider]  docusate sodium (COLACE) 50 MG capsule Take 100 mg by mouth daily as needed for mild constipation.    [provider]  doxazosin (CARDURA) 8 MG tablet Take 4-8 mg by mouth See admin instructions. Tale 8 mg in the morning and 4 mg at bedtime    [provider]  ferrous sulfate 325 (65 FE) MG tablet Take 325 mg by mouth daily.     [provider]  finasteride (PROSCAR) 5 MG tablet Take 5 mg by mouth daily.    [provider]  fluticasone (FLONASE) 50 MCG/ACT nasal spray Place 2 sprays into both nostrils daily as needed for allergies or rhinitis.    [provider]  furosemide (LASIX) 40 MG tablet Take 40 mg by mouth See admin instructions. Take 40 mg daily in the morning  Take additional 40 mg at bedtime on Mon., Wed., and friday    [provider]  hydrALAZINE (APRESOLINE) 100 MG tablet Take 100 mg by mouth 3 (three) times daily.     [provider]  lactulose (CHRONULAC) 10 GM/15ML solution Take 10 g by mouth daily as needed for mild constipation or moderate constipation.    [provider]  loratadine (CLARITIN) 10 MG tablet Take 10 mg by mouth at bedtime.     [provider]  losartan (COZAAR) 100 MG tablet Take 100 mg by mouth daily.    [provider]  NIFEdipine (ADALAT CC) 60 MG 24 hr tablet Take 60 mg by mouth daily.    [provider]  omeprazole (PRILOSEC) 40 MG capsule Take 1 capsule (40 mg total) by mouth 2 (two) times daily before a meal. 01/21/20   Mansouraty, Telford Nab., MD  polycarbophil (FIBERCON) 625 MG tablet Take 625 mg by mouth daily.     [provider]  sennosides-docusate sodium (SENOKOT-S) 8.6-50 MG tablet Take 1 tablet by mouth 2 (two) times daily as needed for constipation.    [provider]  Sodium Phosphates (RA SALINE ENEMA RE) Place 1 application rectally as needed (take every other day as needed for constipation).    [provider]  sucralfate (CARAFATE) 1 GM/10ML suspension Take 10 mLs (1 g total) by mouth 2 (two) times daily. Take once during the day and before bedtime.  Try to no take any other medications 1 hour before or after use of Carafate. 01/21/20 01/20/21  Mansouraty, Telford Nab., MD  vitamin B-12 (CYANOCOBALAMIN) 500 MCG tablet Take 1,000 mcg by mouth daily.    [provider]    Allergies  Lisinopril and Metformin and related  Review of Systems   Review of Systems  Unable to perform ROS: Acuity of condition  Respiratory: Negative for shortness of breath.   Cardiovascular: Negative for chest pain.  Neurological: Positive for dizziness and light-headedness. Negative for headaches.    Physical Exam Updated Vital Signs BP 131/63   Pulse 94   Temp 98.5 F (36.9 C) (Rectal)   Resp 16   SpO2 100%   Physical Exam Vitals and nursing note reviewed.  Constitutional:      Appearance: He is ill-appearing.  HENT:     Head: Normocephalic and atraumatic.  Eyes:     Extraocular Movements: Extraocular movements intact.     Pupils: Pupils are equal, round, and reactive to light.  Cardiovascular:     Rate and Rhythm: Tachycardia present.     Comments: Irregularly irregular.  Pulmonary:     Comments: SpO2 initially 84% on NRB, improved to 92% with 15L NRB. No obvious wheezing, some decreased breath sounds at the bases, tachypnea noted.  Abdominal:     General: There is no distension.     Palpations: Abdomen is soft.     Tenderness: There is no abdominal tenderness. There is no guarding or rebound.  Musculoskeletal:     Cervical back: Neck supple. No rigidity.      Comments: Symmetric 1+ pitting edema to the bilateral lower legs.   Skin:    General: Skin is warm and dry.  Neurological:     Mental Status: He is alert.     Comments: Alert. Clear speech. PERRL. EOMI. Able to move all extremities anti-gravity with the exception of his RLE which quickly drifts. Increased tone & trembling of RUE/RLE. Initially detailed neuro exam deferred to neurology team.     ED Results / Procedures / Treatments   Labs (all labs ordered are listed, but only abnormal results are displayed) Labs Reviewed  CBC - Abnormal; Notable for the following components:      Result Value   RBC 3.19 (*)    Hemoglobin 8.2 (*)    HCT 28.5 (*)    MCH 25.7 (*)    MCHC 28.8 (*)    All other components within normal limits  I-STAT CHEM 8, ED - Abnormal; Notable for the following components:   Potassium 3.3 (*)    Chloride 113 (*)    BUN 39 (*)    Creatinine, Ser 3.30 (*)    Glucose, Bld 185 (*)    Calcium, Ion 1.03 (*)    TCO2 16 (*)    Hemoglobin 8.5 (*)    HCT 25.0 (*)    All other components within normal limits  CBG MONITORING, ED - Abnormal; Notable for the following components:   Glucose-Capillary 173 (*)    All other components within normal limits  RESPIRATORY PANEL BY RT PCR (FLU A&B, COVID)  CULTURE, BLOOD (ROUTINE X 2)  CULTURE, BLOOD (ROUTINE X 2)  PROTIME-INR  APTT  DIFFERENTIAL  COMPREHENSIVE METABOLIC PANEL  LACTIC ACID, PLASMA  LACTIC ACID, PLASMA  BRAIN NATRIURETIC PEPTIDE  TROPONIN I (HIGH SENSITIVITY)    EKG EKG Interpretation  Date/Time:  Monday January 21 2020 16:41:02 EDT Ventricular Rate:  142 PR Interval:    QRS Duration: 97 QT Interval:  248 QTC Calculation: 366 R Axis:   49 Text Interpretation: Atrial fibrillation with rapid V-rate Repolarization abnormality, prob rate related Since prior ECG, rate has increased, rhythm is irregular Confirmed by Gareth Morgan [phone]) on 01/21/2020 6:05:44  PM   Radiology CT Chest Wo  Contrast  Result Date: 01/21/2020 CLINICAL DATA:  Diffuse left-sided infiltrate and respiratory failure EXAM: CT CHEST WITHOUT CONTRAST TECHNIQUE: Multidetector CT imaging of the chest was performed following the standard protocol without IV contrast. COMPARISON:  Chest x-ray from earlier in the same day. FINDINGS: Cardiovascular: Mild aortic calcifications are noted without aneurysmal dilatation. Coronary calcifications are noted. No cardiac enlargement is seen. Small pericardial effusion is noted. Mediastinum/Nodes: Thoracic inlet is within normal limits. No sizable hilar or mediastinal adenopathy is noted although evaluation is somewhat limited due to lack of IV contrast. The esophagus is within normal limits. Lungs/Pleura: Right lung is well aerated without focal infiltrate or sizable effusion. Minimal atelectatic changes are noted. Diffuse infiltrate is noted throughout the left lung but worst in the left lower lobe with multiple air bronchograms consistent with extensive pneumonia. Small effusion is noted. No parenchymal nodules are seen. Upper Abdomen: Visualized upper abdomen demonstrates dependent density within the gallbladder consistent with small stones. Endoscopic clips are noted in the distal stomach/proximal duodenum. Musculoskeletal: Degenerative changes of the thoracic spine are noted. No acute bony abnormality is seen. IMPRESSION: Diffuse left-sided pneumonia. Cholelithiasis without complicating factors. Aortic Atherosclerosis (ICD10-I70.0). Electronically Signed   By: Inez Catalina M.D.   On: 01/21/2020 19:27   DG Chest Portable 1 View  Result Date: 01/21/2020 CLINICAL DATA:  Hypoxia. EXAM: PORTABLE CHEST 1 VIEW COMPARISON:  None. FINDINGS: Marked severity infiltrate is seen involving the left lung base and mid to upper left lung. There is no evidence of a pleural effusion or pneumothorax. The heart size and mediastinal contours are within normal limits. There is mild calcification of the  aortic arch. The visualized skeletal structures are unremarkable. IMPRESSION: Marked severity left-sided infiltrate. Electronically Signed   By: Virgina Norfolk M.D.   On: 01/21/2020 17:04   CT HEAD CODE STROKE WO CONTRAST  Result Date: 01/21/2020 CLINICAL DATA:  Code stroke. Acute neuro deficit. Right-sided weakness. EXAM: CT HEAD WITHOUT CONTRAST TECHNIQUE: Contiguous axial images were obtained from the base of the skull through the vertex without intravenous contrast. COMPARISON:  CT head 06/15/2017. FINDINGS: Brain: Motion degraded study Negative for acute infarct, hemorrhage, or mass lesion. Ventricle size normal. Vascular: Negative for hyperdense vessel Skull: Negative Sinuses/Orbits: Paranasal sinuses clear. Bilateral cataract extraction. Other: None ASPECTS ([place] Stroke Program Early CT Score) - Ganglionic level infarction (caudate, lentiform nuclei, internal capsule, insula, M1-M3 cortex): 7 - Supraganglionic infarction (M4-M6 cortex): 3 Total score (0-10 with 10 being normal): 10 IMPRESSION: 1. No acute intracranial abnormality 2. ASPECTS is 10 3. Motion degraded study. 4. Code stroke imaging results were communicated on 01/21/2020 at 4:33 pm to provider Lorrin Goodell via [facility] text page Electronically Signed   By: Franchot Gallo M.D.   On: 01/21/2020 16:33    Procedures .Critical Care Performed by: Amaryllis Dyke, PA-C Authorized by: Amaryllis Dyke, PA-C     CRITICAL CARE Performed by: Kennith Maes   Total critical care time: 75 minutes  Critical care time was exclusive of separately billable procedures and treating other patients.  Critical care was necessary to treat or prevent imminent or life-threatening deterioration.  Critical care was time spent personally by me on the following activities: development of treatment plan with patient and/or surrogate as well as nursing, discussions with consultants, evaluation of patient's response to treatment,  examination of patient, obtaining history from patient or surrogate, ordering and performing treatments and interventions, ordering and review of laboratory studies, ordering and  review of radiographic studies, pulse oximetry and re-evaluation of patient's condition.   (including critical care time)  Medications Ordered in ED Medications  sodium chloride flush (NS) 0.9 % injection 3 mL (has no administration in time range)  Ampicillin-Sulbactam (UNASYN) 3 g in sodium chloride 0.9 % 100 mL IVPB (has no administration in time range)  furosemide (LASIX) injection 40 mg (has no administration in time range)  diltiazem (CARDIZEM) injection 10 mg (has no administration in time range)    ED Course  I have reviewed the triage vital signs and the nursing notes.  Pertinent labs & imaging results that were available during my care of the patient were reviewed by me and considered in my medical decision making (see chart for details).    MDM Rules/Calculators/A&P                         Patient presents to the ED as code stroke due to dilated R pupil per EMS.  Also was found unresponsive w/ agonal breathing, 2 attempts of synchronized cardioversion PTA without much change. Had endoscopy procedure earlier today.   Upon my assessment of the patient he is on a nonrebreather, he is able to answer questions, he is protecting his airway.  Myself, Dr. Billy Fischer EDP, and neurology patient at the bridge on arrival.  He was taken directly to CT, I stayed with patient in CT dept throughout this time.Marland Kitchen He was transitioned to our monitor where he was found to be hypoxic to 84% and appeared to be in A. fib with RVR with a rate in the 130s to 150s.  Nonrebreather maintained in place with escalation to 15L.  Ultimately CT head was performed which did not show an acute bleed and patient was brought back to resuscitation room due to his vital signs, renal function & IV access.  Per neurology not a TPA candidate at this  time.   Upon return to resuscitation room his SPO2 is improved to 92% on room air, he remains tachycardic consistent with A. fib with RVR in the 140s, and his blood pressure is 130/58 manually.  Portable chest x-ray was ordered, reviewed, and interpreted by me-per radiology marked left-sided infiltrate which I am in agreement with, considering pneumonia (aspiration) versus asymmetric pulmonary edema at this time.  Will obtain blood cultures and lactic acid with initiation of antibiotics as well as a BNP and troponin with initiation of IV Lasix.  10 mg of Cardizem ordered IV for patient's A. fib with RVR.  Will obtain CT chest without contrast for further assessment as with his renal function we are unable to obtain CT angio.   CHA2DS2-VASc score 6  ED course:  Additional history obtained:  Additional history obtained from EMS & chart review. Previous records obtained and reviewed.   Lab Tests:  I Ordered, reviewed, and interpreted labs, which included:  CBC: Mildly worsening anemia. No leukocytosis.  CMP: Worsening renal function. Bicarb low w/ anion gap mildly elevated. Hypokalemia/calcemic.  PT/INR/APTT: WNL COVID/influenza: negative  18:03: Dr. Billy Fischer re-discussed w/ neurology team, cancel CTAs, will need MRI at some point during admission.   Troponin mildly elevated- likely due to demand.  BNP: mild elevation.   Lactic acid: Elevation @ 5.5- given patient is afebrile w/ rectal temp, has no leukocytosis, and was in his usual state of health earlier today low suspicion this is due to sepsis, not currently hypotensive, suspect more secondary to hypoxia which Dr. Billy Fischer is in agreement  with.   Imaging Studies ordered:  I ordered imaging studies which included CXR, & CT chest w/o contrast, CT head ordered per code stroke protocol, I independently visualized and interpreted imaging as discussed above and below.   18:45: RE-EVAL: Patient resting more comfortably, SpO2 98% on NRB, HR  improved to 95-105, BP 120s/50s. Patient's wife at bedside-updated on results and plan of care thus far, she states that while he was feeling lightheaded and dizzy at home he did start coughing and vomited up some apple juice and phlegm, she is unsure if he had a bit of a choking episode or not.  CT chest wo contrast: Diffuse left-sided pneumonia. Cholelithiasis without complicating factors. Aortic atherosclerosis.   20:20: RE-EVAL: Patient continues to have improvement, his SPO2 is currently 98% on 4 L via nasal cannula, his blood pressure is within normal limits, and his A. fib remains rate controlled. Considering broadening abx coverage, will discuss this w/ medicine with plan for admission.   20:35: CONSULT: Discussed with family medicine service- accept admission, requesting cardiology consult for admission which will be done.   Patient in MRI therefore unable to determine who is cardiologist is delaying consultation. Upon return he informs me that he sees the [place] for his cardiologist. Will consult unassigned.   23:01: CONSULT: Discussed with cardiologist Dr. Einar Gip- will see patient in AM.   This is a shared visit with supervising physician Dr. Billy Fischer who has independently evaluated patient & provided guidance in evaluation/management/disposition, in agreement with care   Portions of this note were generated with Dragon dictation software. Dictation errors may occur despite best attempts at proofreading.  Final Clinical Impression(s) / ED Diagnoses Final diagnoses:  Acute hypoxemic respiratory failure ([place])  Right sided weakness  Atrial fibrillation with rapid ventricular response ([facility])  Pneumonia of left lower lobe due to infectious organism    Rx / DC Orders ED Discharge Orders    None       Amaryllis Dyke, PA-C 01/21/20 2312    Gareth Morgan, MD 01/22/20 1456

## 2020-01-21 NOTE — Progress Notes (Addendum)
Brief progress note.   Subjective/interval events:  Does report some back pain with leg movement, no sensory change. No saddle anesthesia / numbness or tingling elsewhere per his report.  BP and O2 saturation improved after lasix and diltiazem with HR control; Chest CT personally reviewed with left sided PNA  Objective: Patient's exam stable with right leg weakness when lifting it antigravity and no other focal neurological findings.  Chronic back pain worsened with bed in the ED. Non-tender to palpation of the back. Normal reflexes left and right patellar and achilles w/ downgoing toes (sometimes needs encouragement to relax)  MRI brain completed, infarct vs. 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 Will continue to monitor for improvement, loss concern for acute spinal cord process at this time based on examination.   Recommendations:  # Right leg weakness  - Blood pressure goal: Normotension - No neurological contraindication to anticoagulation; if using anticoagulation no neurological indication for aspirin  - Consider peripheral causes of leg weakness as patient's respiratory status improves and he is better able to participate in examination - PT consult, OT consult, Speech consult, unless patient is back to baseline - Additional workup as previously recommended by Dr. Lorrin Goodell - Neurology to follow  [place] [phone]  No charge

## 2020-01-21 NOTE — Telephone Encounter (Signed)
Thank you for alerting me.  I have copied this to this evening's on-call physician and will call them as well when I go off duty.

## 2020-01-21 NOTE — ED Notes (Signed)
Pt to MRI at this time.

## 2020-01-21 NOTE — Code Documentation (Signed)
Pt is 79 yr old man brought in by [facility]. Code stroke called at 46. Apparently pt was resting after having had a colonoscopy today when he went unresponsive at 1430. Per EMS when they arrived he had a blown Rt pupil. He was also cardioverted twice per EMS. Pt arrives to our ED at 1610. He is alert and follows commands. Pupils are reactive. He is tachycardic and moderately short of breath. Pt's labs were drawn and he is accompanied to CT by EDP at 1620. NIHSS is 2 for rt leg weakness. In CT scanner Pt's sats are mid 80s on face mask and he is tachypneic. He is tremorous and tachycardic. NCCT obtained and was negative for acute hemorrhage per Dr Lorrin Goodell. Additional imaging requested by neurologist but was agreed between neurology and EDP that pt needed to return to ED room to become more hemodynamically stable before obtaining more imaging, especially as he will need [place] or IV Team to obtain a large IV in [facility]. Dr Lorrin Goodell does not choose to give TPA at this time as NIHSS is low and he is not certain pt is having a stroke. Signed off to [employer].

## 2020-01-21 NOTE — Transfer of Care (Signed)
Immediate Anesthesia Transfer of Care Note  Patient: Todd [name]  Procedure(s) Performed: ESOPHAGOGASTRODUODENOSCOPY (EGD) WITH PROPOFOL (N/A ) ENDOSCOPIC MUCOSAL RESECTION SUBMUCOSAL LIFTING INJECTION HEMOSTASIS CLIP PLACEMENT FOREIGN BODY REMOVAL  Patient Location: PACU  Anesthesia Type:MAC  Level of Consciousness: drowsy  Airway & Oxygen Therapy: Patient Spontanous Breathing and Patient connected to face mask oxygen  Post-op Assessment: Report given to RN and Post -op Vital signs reviewed and stable  Post vital signs: Reviewed and stable  Last Vitals:  Vitals Value Taken Time  BP 99/40 01/21/20 1158  Temp    Pulse 64 01/21/20 1158  Resp 14 01/21/20 1158  SpO2 93 % 01/21/20 1158  Vitals shown include unvalidated device data.  Last Pain:  Vitals:   01/21/20 0928  PainSc: 0-No pain         Complications: No complications documented.

## 2020-01-22 ENCOUNTER — Inpatient Hospital Stay (HOSPITAL_COMMUNITY): Payer: No Typology Code available for payment source

## 2020-01-22 ENCOUNTER — Encounter: Payer: Self-pay | Admitting: Gastroenterology

## 2020-01-22 DIAGNOSIS — R531 Weakness: Secondary | ICD-10-CM | POA: Diagnosis not present

## 2020-01-22 DIAGNOSIS — I4891 Unspecified atrial fibrillation: Secondary | ICD-10-CM | POA: Diagnosis not present

## 2020-01-22 DIAGNOSIS — J9601 Acute respiratory failure with hypoxia: Secondary | ICD-10-CM | POA: Diagnosis not present

## 2020-01-22 DIAGNOSIS — J9620 Acute and chronic respiratory failure, unspecified whether with hypoxia or hypercapnia: Secondary | ICD-10-CM | POA: Diagnosis present

## 2020-01-22 LAB — CBG MONITORING, ED
Glucose-Capillary: 114 mg/dL — ABNORMAL HIGH (ref 70–99)
Glucose-Capillary: 104 mg/dL — ABNORMAL HIGH (ref 70–99)
Glucose-Capillary: 127 mg/dL — ABNORMAL HIGH (ref 70–99)

## 2020-01-22 LAB — CBC
HCT: 25.7 % — ABNORMAL LOW (ref 39.0–52.0)
HCT: 24.3 % — ABNORMAL LOW (ref 39.0–52.0)
Hemoglobin: 7.7 g/dL — ABNORMAL LOW (ref 13.0–17.0)
Hemoglobin: 7.4 g/dL — ABNORMAL LOW (ref 13.0–17.0)
MCH: 25.8 pg — ABNORMAL LOW (ref 26.0–34.0)
MCH: 26.1 pg (ref 26.0–34.0)
MCHC: 30 g/dL (ref 30.0–36.0)
MCHC: 30.5 g/dL (ref 30.0–36.0)
MCV: 86 fL (ref 80.0–100.0)
MCV: 85.9 fL (ref 80.0–100.0)
Platelets: 175 10*3/uL (ref 150–400)
Platelets: 170 10*3/uL (ref 150–400)
RBC: 2.99 MIL/uL — ABNORMAL LOW (ref 4.22–5.81)
RBC: 2.83 MIL/uL — ABNORMAL LOW (ref 4.22–5.81)
RDW: 14 % (ref 11.5–15.5)
RDW: 14 % (ref 11.5–15.5)
WBC: 15.6 10*3/uL — ABNORMAL HIGH (ref 4.0–10.5)
WBC: 16.1 10*3/uL — ABNORMAL HIGH (ref 4.0–10.5)
nRBC: 0 % (ref 0.0–0.2)
nRBC: 0 % (ref 0.0–0.2)

## 2020-01-22 LAB — COMPREHENSIVE METABOLIC PANEL
ALT: 19 U/L (ref 0–44)
AST: 44 U/L — ABNORMAL HIGH (ref 15–41)
Albumin: 3.1 g/dL — ABNORMAL LOW (ref 3.5–5.0)
Alkaline Phosphatase: 36 U/L — ABNORMAL LOW (ref 38–126)
Anion gap: 13 (ref 5–15)
BUN: 44 mg/dL — ABNORMAL HIGH (ref 8–23)
CO2: 21 mmol/L — ABNORMAL LOW (ref 22–32)
Calcium: 8.6 mg/dL — ABNORMAL LOW (ref 8.9–10.3)
Chloride: 109 mmol/L (ref 98–111)
Creatinine, Ser: 3.42 mg/dL — ABNORMAL HIGH (ref 0.61–1.24)
GFR, Estimated: 16 mL/min — ABNORMAL LOW (ref >60)
Glucose, Bld: 146 mg/dL — ABNORMAL HIGH (ref 70–99)
Potassium: 3.6 mmol/L (ref 3.5–5.1)
Sodium: 143 mmol/L (ref 135–145)
Total Bilirubin: 0.7 mg/dL (ref 0.3–1.2)
Total Protein: 6.1 g/dL — ABNORMAL LOW (ref 6.5–8.1)

## 2020-01-22 LAB — TROPONIN I (HIGH SENSITIVITY)
Troponin I (High Sensitivity): 908 ng/L (ref <18)
Troponin I (High Sensitivity): 972 ng/L (ref <18)
Troponin I (High Sensitivity): 872 ng/L (ref <18)
Troponin I (High Sensitivity): 998 ng/L (ref <18)

## 2020-01-22 LAB — ECHOCARDIOGRAM COMPLETE
Area-P 1/2: 2.6 cm2
Height: 73 in
S' Lateral: 2.9 cm
Weight: 3536 oz

## 2020-01-22 LAB — LACTIC ACID, PLASMA: Lactic Acid, Venous: 1.3 mmol/L (ref 0.5–1.9)

## 2020-01-22 LAB — SURGICAL PATHOLOGY

## 2020-01-22 MED ORDER — APIXABAN 5 MG PO TABS
5.0000 mg | ORAL_TABLET | Freq: Two times a day (BID) | ORAL | Status: DC
  Administered 2020-01-22: 5 mg via ORAL
  Filled 2020-01-22: qty 1

## 2020-01-22 MED ORDER — POTASSIUM CHLORIDE 20 MEQ PO PACK
40.0000 meq | PACK | Freq: Once | ORAL | Status: AC
  Administered 2020-01-22: 40 meq via ORAL
  Filled 2020-01-22: qty 2

## 2020-01-22 MED ORDER — ALBUTEROL SULFATE HFA 108 (90 BASE) MCG/ACT IN AERS: 2.0000 | INHALATION_SPRAY | Freq: Four times a day (QID) | RESPIRATORY_TRACT | Status: DC | PRN

## 2020-01-22 MED ORDER — LACTATED RINGERS IV SOLN: INTRAVENOUS | Status: AC

## 2020-01-22 MED ORDER — APIXABAN 2.5 MG PO TABS
2.5000 mg | ORAL_TABLET | Freq: Two times a day (BID) | ORAL | Status: DC
  Administered 2020-01-23 – 2020-01-29 (×14): 2.5 mg via ORAL
  Filled 2020-01-22 (×15): qty 1

## 2020-01-22 MED ORDER — ATORVASTATIN CALCIUM 80 MG PO TABS
80.0000 mg | ORAL_TABLET | Freq: Every day | ORAL | Status: DC
  Administered 2020-01-22 – 2020-01-29 (×9): 80 mg via ORAL
  Filled 2020-01-22 (×8): qty 1
  Filled 2020-01-22: qty 2
  Filled 2020-01-22: qty 1

## 2020-01-22 MED ORDER — FLUTICASONE PROPIONATE 50 MCG/ACT NA SUSP: 2.0000 | Freq: Every day | NASAL | Status: DC | PRN

## 2020-01-22 MED ORDER — FERROUS SULFATE 325 (65 FE) MG PO TABS
325.0000 mg | ORAL_TABLET | Freq: Every day | ORAL | Status: DC
  Administered 2020-01-22 – 2020-01-29 (×8): 325 mg via ORAL
  Filled 2020-01-22 (×8): qty 1

## 2020-01-22 MED ORDER — PANTOPRAZOLE SODIUM 40 MG PO TBEC
40.0000 mg | DELAYED_RELEASE_TABLET | Freq: Every day | ORAL | Status: DC
  Administered 2020-01-22 – 2020-01-29 (×8): 40 mg via ORAL
  Filled 2020-01-22 (×8): qty 1

## 2020-01-22 MED ORDER — APIXABAN 5 MG PO TABS: 5.0000 mg | ORAL_TABLET | Freq: Two times a day (BID) | ORAL | Status: DC

## 2020-01-22 MED ORDER — CARVEDILOL 6.25 MG PO TABS
6.2500 mg | ORAL_TABLET | Freq: Two times a day (BID) | ORAL | Status: DC
  Administered 2020-01-22 – 2020-01-23 (×2): 6.25 mg via ORAL
  Filled 2020-01-22: qty 1
  Filled 2020-01-22: qty 2

## 2020-01-22 MED ORDER — ASPIRIN 81 MG PO CHEW
81.0000 mg | CHEWABLE_TABLET | Freq: Every day | ORAL | Status: DC
  Administered 2020-01-22: 81 mg via ORAL
  Filled 2020-01-22: qty 1

## 2020-01-22 MED ORDER — SODIUM CHLORIDE 0.9 % IV SOLN
1.5000 g | Freq: Two times a day (BID) | INTRAVENOUS | Status: DC
  Administered 2020-01-22 – 2020-01-23 (×3): 1.5 g via INTRAVENOUS
  Filled 2020-01-22: qty 1.5
  Filled 2020-01-22 (×2): qty 4
  Filled 2020-01-22: qty 1.5

## 2020-01-22 NOTE — ED Notes (Signed)
02 turned off  Pt on R/A at this time per Dr. Serita Grit order

## 2020-01-22 NOTE — ED Notes (Signed)
Ordered hospital bed 

## 2020-01-22 NOTE — Consult Note (Signed)
CARDIOLOGY CONSULT NOTE  Patient ID: Todd [name] MRN: [id number] DOB/AGE: Mar 28, 1941 79 y.o.  Admit date: 01/21/2020 Referring Physician: Family medicine Reason for Consultation:  Troponin elevation  HPI:   79 y.o. African American male  with hypertension, hyperlipidemia, type 2 DM, CKD IV, paroxysmal Afib, h/o TIA/stroke, admitted with symptomatic Afib RVR requiring cardioversion, new stroke, troponin elevation.  Patient underwent endoscopic procedure on 01/21/2020. Following this, he went into symptomatic Afib w/RVR up to 200, associated with hypotension and lightheadedness. He was cardioverted by EMS. Concurrently, he was also noted to have right leg drift. He was evaluated by neurology and thought to have a new stroke. Per Neurology, code stroke workup had to be aborted 2/2 desaturations to 80s with persistent tachycardia and hypotension. Initial CTH with no ICH. tPA was not offered 2/2 significant hemodynamic instability and some concern for a potential systemic bleed. He was not a candidate for thrombectomy 2/2 low NIHSS of 2.  MRI brain showed Subtle 4 mm focus of diffusion abnormality involving the ventral left medulla. While this finding could be artifactual in nature, a possible subtle acute ischemic infarct is difficult to exclude, and could be considered in the correct clinical setting. No associated hemorrhage or mass effect, otherwise negative brain MRI for age. Intracranial MRA was negative for large vessel occlusion.   Currently, he is back to baseline. Wife present at bedside. He denies chest pain, shortness of breath, palpitations, leg edema, orthopnea, PND, TIA/syncope.  Patients primary cardiologist is at [facility]. He was on amiodarone in the past, which was stopped due to concern for adverse effect on pulmonary function. Wife states that he was previously on carvedilol, but does not recollect the dose.   Patient reportedly had EGD on 01/21/2020 for gastric polyp  removal. Patient denies any bleeding, melena, hematochezia, hematemesis.   Past Medical History:  Diagnosis Date  . Anemia   . Asthma    history of asthma  . Atrial fibrillation ([place])   . Chronic kidney disease   . Diabetes mellitus without complication ([place])    type 2 - no meds  . GERD (gastroesophageal reflux disease)   . HLD (hyperlipidemia)   . Hypertension   . Seasonal allergies   . TIA (transient ischemic attack) 06/15/2017     Past Surgical History:  Procedure Laterality Date  . COLONOSCOPY WITH ESOPHAGOGASTRODUODENOSCOPY (EGD)  04/2019  . ESOPHAGOGASTRODUODENOSCOPY N/A 10/17/2014   Procedure: ESOPHAGOGASTRODUODENOSCOPY (EGD);  Surgeon: Wilford Corner, MD;  Location: [facility] ENDOSCOPY;  Service: Endoscopy;  Laterality: N/A;  . multiple tooth implants     pt thinks sedation      Family History  Problem Relation Age of Onset  . Diabetes Mother   . Diabetes Father   . Stroke Father   . Colon cancer Father   . Diabetes Sister   . Stomach cancer Neg Hx   . Pancreatic cancer Neg Hx   . Esophageal cancer Neg Hx   . Inflammatory bowel disease Neg Hx   . Liver disease Neg Hx      Social History: Social History   Socioeconomic History  . Marital status: Married    Spouse name: Not on file  . Number of children: 2  . Years of education: Not on file  . Highest education level: Not on file  Occupational History  . Occupation: retired  Tobacco Use  . Smoking status: Never Smoker  . Smokeless tobacco: Never Used  Vaping Use  . Vaping Use: Never used  Substance and Sexual  Activity  . Alcohol use: No  . Drug use: No  . Sexual activity: Not Currently    Partners: Female  Other Topics Concern  . Not on file  Social History Narrative  . Not on file   Social Determinants of Health   Financial Resource Strain:   . Difficulty of Paying Living Expenses: Not on file  Food Insecurity:   . Worried About Charity fundraiser in the Last Year: Not on file  . Ran Out of  Food in the Last Year: Not on file  Transportation Needs:   . Lack of Transportation (Medical): Not on file  . Lack of Transportation (Non-Medical): Not on file  Physical Activity:   . Days of Exercise per Week: Not on file  . Minutes of Exercise per Session: Not on file  Stress:   . Feeling of Stress : Not on file  Social Connections:   . Frequency of Communication with Friends and Family: Not on file  . Frequency of Social Gatherings with Friends and Family: Not on file  . Attends Religious Services: Not on file  . Active Member of Clubs or Organizations: Not on file  . Attends Archivist Meetings: Not on file  . Marital Status: Not on file  Intimate Partner Violence:   . Fear of Current or Ex-Partner: Not on file  . Emotionally Abused: Not on file  . Physically Abused: Not on file  . Sexually Abused: Not on file     (Not in a hospital admission)   ROS    Physical Exam: Physical Exam   Labs:   Lab Results  Component Value Date   WBC 15.6 (H) 01/22/2020   HGB 7.7 (L) 01/22/2020   HCT 25.7 (L) 01/22/2020   MCV 86.0 01/22/2020   PLT 175 01/22/2020    Recent Labs  Lab 01/22/20 0244  NA 143  K 3.6  CL 109  CO2 21*  BUN 44*  CREATININE 3.42*  CALCIUM 8.6*  PROT 6.1*  BILITOT 0.7  ALKPHOS 36*  ALT 19  AST 44*  GLUCOSE 146*    Lipid Panel     Component Value Date/Time   CHOL 320 (H) 06/16/2017 0724   TRIG 280 (H) 06/16/2017 0724   HDL 57 06/16/2017 0724   CHOLHDL 5.6 06/16/2017 0724   VLDL 56 (H) 06/16/2017 0724   LDLCALC 207 (H) 06/16/2017 0724    BNP (last 3 results) Recent Labs    01/21/20 1730  BNP 185.1*   Results for Todd [name] (MRN [id number]) as of 01/22/2020 09:42  Ref. Range 01/21/2020 17:30 01/22/2020 02:44  Troponin I (High Sensitivity) Latest Ref Range: <18 ng/L 57 (H) 908 (HH)    HEMOGLOBIN A1C Lab Results  Component Value Date   HGBA1C 7.6 (H) 06/15/2017   MPG 171.42 06/15/2017      Radiology: CT Chest Wo  Contrast  Result Date: 01/21/2020 CLINICAL DATA:  Diffuse left-sided infiltrate and respiratory failure EXAM: CT CHEST WITHOUT CONTRAST TECHNIQUE: Multidetector CT imaging of the chest was performed following the standard protocol without IV contrast. COMPARISON:  Chest x-ray from earlier in the same day. FINDINGS: Cardiovascular: Mild aortic calcifications are noted without aneurysmal dilatation. Coronary calcifications are noted. No cardiac enlargement is seen. Small pericardial effusion is noted. Mediastinum/Nodes: Thoracic inlet is within normal limits. No sizable hilar or mediastinal adenopathy is noted although evaluation is somewhat limited due to lack of IV contrast. The esophagus is within normal limits. Lungs/Pleura: Right lung is well  aerated without focal infiltrate or sizable effusion. Minimal atelectatic changes are noted. Diffuse infiltrate is noted throughout the left lung but worst in the left lower lobe with multiple air bronchograms consistent with extensive pneumonia. Small effusion is noted. No parenchymal nodules are seen. Upper Abdomen: Visualized upper abdomen demonstrates dependent density within the gallbladder consistent with small stones. Endoscopic clips are noted in the distal stomach/proximal duodenum. Musculoskeletal: Degenerative changes of the thoracic spine are noted. No acute bony abnormality is seen. IMPRESSION: Diffuse left-sided pneumonia. Cholelithiasis without complicating factors. Aortic Atherosclerosis (ICD10-I70.0). Electronically Signed   By: Inez Catalina M.D.   On: 01/21/2020 19:27   MR ANGIO HEAD WO CONTRAST  Result Date: 01/21/2020 CLINICAL DATA:  Initial evaluation for acute neuro deficit, stroke suspected. EXAM: MRI HEAD WITHOUT CONTRAST MRA HEAD WITHOUT CONTRAST TECHNIQUE: Multiplanar, multiecho pulse sequences of the brain and surrounding structures were obtained without intravenous contrast. Angiographic images of the head were obtained using MRA technique  without contrast. COMPARISON:  Prior CT from earlier the same day. FINDINGS: MRI HEAD FINDINGS Brain: Generalized age-related cerebral atrophy. Scattered T2/FLAIR hyperintensity within the periventricular white matter most like related chronic microvascular ischemic disease, minimal for age. There is a subtle 4 mm focus of diffusion abnormality involving the ventral left medulla (series 5, image 67). Associated signal loss on corresponding ADC map (series 6, image 13). While this finding could be artifactual nature, a possible subtle acute ischemic infarct is difficult to exclude, and could be considered in the correct clinical setting. No associated hemorrhage or mass effect. No other foci of restricted diffusion to suggest acute or subacute ischemia. Gray-white matter differentiation otherwise maintained. No encephalomalacia to suggest chronic cortical infarction elsewhere within the brain. No acute intracranial hemorrhage. Few scattered punctate chronic micro hemorrhages noted, most notable at the left cerebellum, likely hypertensive in nature. No mass lesion, midline shift or mass effect. No hydrocephalus or extra-axial fluid collection. Pituitary gland suprasellar region within normal limits. Midline structures intact. Vascular: Major intracranial vascular flow voids are maintained. Skull and upper cervical spine: Bone marrow signal intensity within normal limits. No scalp soft tissue abnormality. Sinuses/Orbits: Patient status post bilateral ocular lens replacement. Paranasal sinuses are largely clear. No significant mastoid effusion. Inner ear structures grossly normal. Other: None. MRA HEAD FINDINGS ANTERIOR CIRCULATION: Examination mildly degraded by motion artifact. Visualized distal cervical segments of the internal carotid arteries are widely patent with symmetric antegrade flow. Petrous segments widely patent bilaterally. Scattered atheromatous irregularity throughout the carotid siphons without  high-grade stenosis. A1 segments patent bilaterally. Normal anterior communicating artery complex. Anterior cerebral arteries widely patent to their distal aspects without stenosis. No M1 stenosis or occlusion. Normal MCA bifurcations. Distal MCA branches well perfused and symmetric. POSTERIOR CIRCULATION: Vertebral arteries patent to the vertebrobasilar junction without stenosis. Right vertebral artery slightly dominant. Both PICAs patent. Basilar patent to its distal aspect without stenosis. Superior cerebral arteries patent bilaterally. Both PCA supplied via the basilar as well as robust bilateral posterior communicating arteries. PCAs well perfused to their distal aspects without stenosis. No intracranial aneurysm. IMPRESSION: MRI HEAD IMPRESSION: 1. Subtle 4 mm focus of diffusion abnormality involving the ventral left medulla. While this finding could be artifactual in nature, a possible subtle acute ischemic infarct is difficult to exclude, and could be considered in the correct clinical setting. No associated hemorrhage or mass effect. 2. Otherwise negative brain MRI for age. MRA HEAD IMPRESSION: 1. Mildly motion degraded exam. 2. Negative intracranial MRA for large vessel occlusion. No hemodynamically significant  or correctable stenosis. Electronically Signed   By: Jeannine Boga M.D.   On: 01/21/2020 23:31   MR BRAIN WO CONTRAST  Result Date: 01/21/2020 CLINICAL DATA:  Initial evaluation for acute neuro deficit, stroke suspected. EXAM: MRI HEAD WITHOUT CONTRAST MRA HEAD WITHOUT CONTRAST TECHNIQUE: Multiplanar, multiecho pulse sequences of the brain and surrounding structures were obtained without intravenous contrast. Angiographic images of the head were obtained using MRA technique without contrast. COMPARISON:  Prior CT from earlier the same day. FINDINGS: MRI HEAD FINDINGS Brain: Generalized age-related cerebral atrophy. Scattered T2/FLAIR hyperintensity within the periventricular white matter  most like related chronic microvascular ischemic disease, minimal for age. There is a subtle 4 mm focus of diffusion abnormality involving the ventral left medulla (series 5, image 67). Associated signal loss on corresponding ADC map (series 6, image 13). While this finding could be artifactual nature, a possible subtle acute ischemic infarct is difficult to exclude, and could be considered in the correct clinical setting. No associated hemorrhage or mass effect. No other foci of restricted diffusion to suggest acute or subacute ischemia. Gray-white matter differentiation otherwise maintained. No encephalomalacia to suggest chronic cortical infarction elsewhere within the brain. No acute intracranial hemorrhage. Few scattered punctate chronic micro hemorrhages noted, most notable at the left cerebellum, likely hypertensive in nature. No mass lesion, midline shift or mass effect. No hydrocephalus or extra-axial fluid collection. Pituitary gland suprasellar region within normal limits. Midline structures intact. Vascular: Major intracranial vascular flow voids are maintained. Skull and upper cervical spine: Bone marrow signal intensity within normal limits. No scalp soft tissue abnormality. Sinuses/Orbits: Patient status post bilateral ocular lens replacement. Paranasal sinuses are largely clear. No significant mastoid effusion. Inner ear structures grossly normal. Other: None. MRA HEAD FINDINGS ANTERIOR CIRCULATION: Examination mildly degraded by motion artifact. Visualized distal cervical segments of the internal carotid arteries are widely patent with symmetric antegrade flow. Petrous segments widely patent bilaterally. Scattered atheromatous irregularity throughout the carotid siphons without high-grade stenosis. A1 segments patent bilaterally. Normal anterior communicating artery complex. Anterior cerebral arteries widely patent to their distal aspects without stenosis. No M1 stenosis or occlusion. Normal MCA  bifurcations. Distal MCA branches well perfused and symmetric. POSTERIOR CIRCULATION: Vertebral arteries patent to the vertebrobasilar junction without stenosis. Right vertebral artery slightly dominant. Both PICAs patent. Basilar patent to its distal aspect without stenosis. Superior cerebral arteries patent bilaterally. Both PCA supplied via the basilar as well as robust bilateral posterior communicating arteries. PCAs well perfused to their distal aspects without stenosis. No intracranial aneurysm. IMPRESSION: MRI HEAD IMPRESSION: 1. Subtle 4 mm focus of diffusion abnormality involving the ventral left medulla. While this finding could be artifactual in nature, a possible subtle acute ischemic infarct is difficult to exclude, and could be considered in the correct clinical setting. No associated hemorrhage or mass effect. 2. Otherwise negative brain MRI for age. MRA HEAD IMPRESSION: 1. Mildly motion degraded exam. 2. Negative intracranial MRA for large vessel occlusion. No hemodynamically significant or correctable stenosis. Electronically Signed   By: Jeannine Boga M.D.   On: 01/21/2020 23:31   DG Chest Portable 1 View  Result Date: 01/21/2020 CLINICAL DATA:  Hypoxia. EXAM: PORTABLE CHEST 1 VIEW COMPARISON:  None. FINDINGS: Marked severity infiltrate is seen involving the left lung base and mid to upper left lung. There is no evidence of a pleural effusion or pneumothorax. The heart size and mediastinal contours are within normal limits. There is mild calcification of the aortic arch. The visualized skeletal structures are unremarkable. IMPRESSION:  Marked severity left-sided infiltrate. Electronically Signed   By: Virgina Norfolk M.D.   On: 01/21/2020 17:04   CT HEAD CODE STROKE WO CONTRAST  Result Date: 01/21/2020 CLINICAL DATA:  Code stroke. Acute neuro deficit. Right-sided weakness. EXAM: CT HEAD WITHOUT CONTRAST TECHNIQUE: Contiguous axial images were obtained from the base of the skull  through the vertex without intravenous contrast. COMPARISON:  CT head 06/15/2017. FINDINGS: Brain: Motion degraded study Negative for acute infarct, hemorrhage, or mass lesion. Ventricle size normal. Vascular: Negative for hyperdense vessel Skull: Negative Sinuses/Orbits: Paranasal sinuses clear. Bilateral cataract extraction. Other: None ASPECTS ([place] Stroke Program Early CT Score) - Ganglionic level infarction (caudate, lentiform nuclei, internal capsule, insula, M1-M3 cortex): 7 - Supraganglionic infarction (M4-M6 cortex): 3 Total score (0-10 with 10 being normal): 10 IMPRESSION: 1. No acute intracranial abnormality 2. ASPECTS is 10 3. Motion degraded study. 4. Code stroke imaging results were communicated on 01/21/2020 at 4:33 pm to provider Lorrin Goodell via [facility] text page Electronically Signed   By: Franchot Gallo M.D.   On: 01/21/2020 16:33    Scheduled Meds: .  stroke: mapping our early stages of recovery book   Does not apply Once  . apixaban  5 mg Oral BID  . aspirin  81 mg Oral Daily  . atorvastatin  80 mg Oral q1800  . ferrous sulfate  325 mg Oral Daily  . pantoprazole  40 mg Oral Daily   Continuous Infusions: . ampicillin-sulbactam (UNASYN) IV    . lactated ringers 125 mL/hr at 01/22/20 0257   PRN Meds:.albuterol, fluticasone  CARDIAC STUDIES:  EKG 01/22/2020: Sinus rhythm 84 bpm. Early repolarization.  No STEMI  EKG 01/21/2020: Afib w/RVR 140 bpm. Nonspecific ST-T changes. Occasional PVC  Echocardiogram 06/2017:  - Left ventricle: The cavity size was normal. Wall thickness was  increased in a pattern of mild LVH. Systolic function was mildly  reduced. The estimated ejection fraction was in the range of 45%  to 50%. Although no diagnostic regional wall motion abnormality  was identified, this possibility cannot be completely excluded on  the basis of this study.  - Mitral valve: There was mild to moderate regurgitation.  - Left atrium: The atrium was  moderately dilated.  - Pulmonary arteries: PA peak pressure: 38 mm Hg (S).     Assessment & Recommendations:  79 y.o. African American male  with hypertension, hyperlipidemia, type 2 DM, CKD IV, paroxysmal Afib, h/o TIA/stroke, admitted with symptomatic Afib RVR requiring cardioversion, new stroke, troponin elevation.  Trponin elevation: Type 2 MI in the setting of Afib w/RVR, stroke. Not ACS  Paroxysmal Afib: Episodes of RVR requiring emergent cardioversion. Currently in sinus rhythm. CHA2DS2VASc score 6, annual stroke risk 9.8%. Previously on eliquis, held for endoscopic procedure 2 days prior.  Consult with Neurology re: resumption of anticoagulation in the setting of possible new stroke. Recommend dose would be 2.5 mg bid. Recommend resuming carvedilol after verifying the dose with [facility].     Nigel Mormon, MD Pager: [phone] Office: [phone]

## 2020-01-22 NOTE — Anesthesia Postprocedure Evaluation (Signed)
Anesthesia Post Note  Patient: Todd [name]  Procedure(s) Performed: ESOPHAGOGASTRODUODENOSCOPY (EGD) WITH PROPOFOL (N/A ) ENDOSCOPIC MUCOSAL RESECTION SUBMUCOSAL LIFTING INJECTION HEMOSTASIS CLIP PLACEMENT FOREIGN BODY REMOVAL     Patient location during evaluation: Endoscopy Anesthesia Type: MAC Level of consciousness: awake and alert Pain management: pain level controlled Vital Signs Assessment: post-procedure vital signs reviewed and stable Respiratory status: spontaneous breathing, nonlabored ventilation, respiratory function stable and patient connected to nasal cannula oxygen Cardiovascular status: stable and blood pressure returned to baseline Postop Assessment: no apparent nausea or vomiting Anesthetic complications: no   No complications documented.  Last Vitals:  Vitals:   01/21/20 1240 01/21/20 1250  BP: (!) 128/48 (!) 118/47  Pulse: 72 72  Resp: 18 18  Temp:    SpO2: 93% 92%    Last Pain:  Vitals:   01/21/20 1250  TempSrc:   PainSc: 0-No pain                 [name] S

## 2020-01-22 NOTE — ED Notes (Signed)
Paged FM to RN Autumn--Todd [name]

## 2020-01-22 NOTE — ED Notes (Signed)
Pt was able to drink KCL and water with PO meds with no difficulty. Pt moved onto inpt hospital bed for comfort while holding in the ED. Pts wife at bedside provided ice/water and recliner for comfort.

## 2020-01-22 NOTE — Progress Notes (Signed)
Carotid artery duplex has been completed. Preliminary results can be found in CV Proc through chart review.   01/22/20 10:35 AM Carlos Levering RVT

## 2020-01-22 NOTE — Progress Notes (Signed)
Echocardiogram 2D Echocardiogram has been performed.  Oneal Deputy Rae Plotner 01/22/2020, 11:09 AM

## 2020-01-22 NOTE — Progress Notes (Signed)
Received page about increase in troponin 57 > 908. Repeat EKG with minimal ST elevation in the anterior leads, no ST depressions on my interpretation. Denying chest pain. BP remains stable 136/69 and remains on room air since earlier tonight.  Discussed with Dr. Einar Gip, cardiology. He felt this was likely related to A Fib and cardioversions. He recommended continuing to trend troponins, no other interventions needed at this time.  - continue trending troponin

## 2020-01-22 NOTE — Progress Notes (Signed)
Family Medicine Teaching Service Daily Progress Note Intern Pager: [phone]  Patient name: Todd [name] Medical record number: [id number] Date of birth: 06-26-1940 Age: 79 y.o. Gender: male  Primary Care Provider: Charlsie Merles, MD Consultants: Neurology, Cardiology Code Status: Full  Pt Overview and Major Events to Date:  10/18 Admitted  Assessment and Plan:  Todd [name] is a 79 y.o. male presenting with altered mental status and blown pupils shortly after EGD concerning for acute CVA and acute hypoxemic respiratory failure likely secondary to pneumonia. PMH is significant for paroxysmal A Fib (on apixaban), TIA, T2DM, HTN, HLD, CKD, GERD, iron deficiency anemia.   Acute hypoxemic respiratory failure  possible aspiration pneumonia Patient underwent EGD 10/18 later was weak and found unresponsive with agonal breathing. CXR showed marked left-sided infiltrate concerning for pneumonia.  CT chest was also obtained with findings consistent with diffuse left-sided pneumonia.  Top differential is aspiration pneumonia as pt underwent EGD. Patient received a dose of Unasyn in the ED due to concern for aspiration pneumonia.  Respiratory status had improved since presentation to saturating well on room air. Patient has remained afebrile and without significant cough or leukocytosis. Amiodarone lung toxicity unlikely due to symptom progression. Must consider PE causing acute hypoxia and hemodynamic instability as pt has been off anticoagulation for >3 days. Wells score for PE 4.5 (moderate risk), denies chest pain and hypoxia improving with antibiotics, currently on room air. 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 Blown pupil was not noted in the ED. Neurology was consulted. On their evaluation, he was noted to be more awake and alert, following commands and with a drift in his right leg.  CT head without contrast without evidence of ICH. MRI of the brain did reveal potential subtle acute ischemic infarct in the ventral left medulla versus artifact. Neurology will continue to follow before making a diagnosis of CVA. Of note, patient's anticoagulation was held for a few days for procedure today (wife states his last dose of apixaban was this past Friday 10/15). Has history of TIA 06/2017. Stroke swallow screen passed in the ED. Is now able to lift Right leg. Pupils both equal and reactive to direct and consensual light. -Appreciate Neurology recommendations -Monitor limb weakness with serial exams -Neurology recommends normotensive BP goal -Hold all home anti-hypertensives -US Carotid doppler -F/u TEE -Lipid panel, HbA1c   A Fib with RVR A fib in RVR to 130 in the ED, rate controlled s/p 10 mg IV diltiazem. Tachycardic up to 200 prior to arrival s/p cardioversions x2 without significant change. Home medication is Eliquis 5mg  BID Cardiology consulted in the ED, and will see patient today. Had a trop of 908 and Cardiology called and recommended no intervention at this time as likely due to his A fib/Cardioversions, continue to trend trop. EKG this morning shows sinus rhythm. [facility] to [facility] dosing of Carvedilol and restart on a lesser dose. -Appreciate Cardiology recommendations -Restart home apixaban 5mg  then continue 2.5 BID given pt has been off anticoagulation for >3 days and risk of stroke outweighs risk of acute hemorrhage -Cardiac monitoring   Anion  gap metabolic acidosis On admission elevated lactate 5.5. with anion gap metabolic acidosis (bicarb 16, AG 17). This morning anion gap is 13 and Lactic Acid is  1.3. -Trend lactate -Continue IVF fluids LR 163ml/hr x 12h   Hypokalemia K 3.3 on admission and repleted with KCl 40 mEq. K this morning is 3.6. -Monitor with daily BMP -Replete as necessary    HTN  SBP 130-170 Home meds: amlodipine 10 mg, doxazosin (8 mg AM, 4 mg bedtime), hydralazine 100 mg TID, losartan 100 mg, nifedipine 60 mg. 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 On examination patient has no crackles and 1+ peripheral pitting edema. BNP 185.1. Home medication is Furosemide 40 mg MWF. S/p IV furosemide 40 mg in the ED. -Holding furosemide (permissive HTN) -f/u TTE -strict I/O -Daily weights   HLD Home meds: atorvastatin 80 mg. -Atorvastatin 80mg  once daily -Follow up lipid panel    Asthma Home meds: albuterol prn - Continue albuterol prn    Diabetes Last A1c 7.6 in 2019.  Not on any antihyperglycemic agents. - f/u HbA1c -CBGs Q4H -Start sSSI if needed   AKI on CKD Cr 3.30 on admission. Baseline appears to be around 2.6-2.7. Follows with nephrology at the [facility] in [place]. Creatinine today is 3.42 - monitor with BMP -Continue IVF LR at 125 cc/hr -Avoid nephrotoxic agents  -Nephrology consult am    Normocytic anemia Hb 8.5>7.7 on admission. Home meds: ferrous sulfate 325 mg daily, vitamin B12 1000 mcg daily. -Restart Ferrous Sulphate 325mg  daily -Clarify transfusion threshold  -Monitor Hgb q12 to ensure stability   GERD Home meds: omeprazole 40 mg BID, sucralfate 1 g BID. -Continue pantoprazole 40mg  per formulary   BPH Home meds: doxazosin (8 mg AM, 4 mg PM), finasteride 5 mg. - holding both doxazosin and finasteride (can cause hypotension)   FEN/GI: Heart Healthy PPx: Eliquis  Disposition: Progressive  Prior to Admission Living Arrangement: Home Anticipated Discharge Location: Home Barriers to Discharge: Stroke Work up Anticipated discharge in  approximately 1-3 day(s).   Subjective:  Interviewed patient at bedside in the ED.  Objective: Temp:  [97.3 F (36.3 C)-98.7 F (37.1 C)] 98.7 F (37.1 C) (10/18 2313) Pulse Rate:  [64-130] 85 (10/19 0430) Resp:  [10-32] 17 (10/19 0430) BP: (92-170)/(40-72) 136/69 (10/19 0430) SpO2:  [91 %-100 %] 96 % (10/19 0430) Weight:  [221 lb (100.2 kg)] 221 lb (100.2 kg) (10/18 2154) Physical Exam:  Physical Exam Vitals and nursing note reviewed.  Constitutional:      General: He is not in acute distress.    Appearance: Normal appearance. He is not ill-appearing, toxic-appearing or diaphoretic.  HENT:     Head: Normocephalic and atraumatic.  Cardiovascular:     Rate and Rhythm: Normal rate and regular rhythm.     Pulses: Normal pulses.          Radial pulses are 2+ on the right side and 2+ on the left side.       Dorsalis pedis pulses are 2+ on the right side and 2+ on the left side.     Heart sounds: Normal heart sounds, S1 normal and S2 normal. No murmur heard.   Pulmonary:     Effort: Pulmonary effort is normal. No respiratory distress.     Breath sounds: Wheezing (mild) present.  Abdominal:     General: There is no distension.     Palpations: There is no mass.  Tenderness: There is no abdominal tenderness.  Musculoskeletal:     Right lower leg: No edema.     Left lower leg: No edema.  Neurological:     Mental Status: He is alert. Mental status is at baseline.     Comments: Strength- Right/Left Upper Extremity 4/5 Left Lower Extremity 4/5 Right Lower Extremity 3/5 (improving)  Pupils equil and reactive to direct and consensual light.      Laboratory: Recent Labs  Lab 01/21/20 1619 01/21/20 1620 01/22/20 0244  WBC  --  7.7 15.6*  HGB 8.5* 8.2* 7.7*  HCT 25.0* 28.5* 25.7*  PLT  --  200 175   Recent Labs  Lab 01/21/20 1619 01/21/20 1620 01/22/20 0244  NA 142 144 143  K 3.3* 3.3* 3.6  CL 113* 111 109  CO2  --  16* 21*  BUN 39* 42* 44*  CREATININE 3.30*  3.32* 3.42*  CALCIUM  --  8.6* 8.6*  PROT  --  5.9* 6.1*  BILITOT  --  0.8 0.7  ALKPHOS  --  43 36*  ALT  --  16 19  AST  --  20 44*  GLUCOSE 185* 189* 146*    LA: 1.3 (WNL) Trop: 908 (high)   Imaging/Diagnostic Tests:   MR Angio Head WO Contrast: Mildly motion degraded exam. Negative intracranial MRA for large vessel occlusion. No hemodynamically significant or correctable stenosis.   MR Brain WO Contrast: Mildly motion degraded exam. Negative intracranial MRA for large vessel occlusion. No hemodynamically significant or correctable stenosis.   CT Chest WO Contrast: Diffuse left-sided pneumonia. Cholelithiasis without complicating factors.   DG Chest Portable 1 View: Marked severity left-sided infiltrate.   CT Head Code Stroke WO Contrast: No acute intracranial abnormality. ASPECTS is 10. Motion degraded study.   Briant Cedar, MD 01/22/2020, 6:27 AM PGY-1, [address] Intern pager: [phone], text pages welcome

## 2020-01-22 NOTE — ED Notes (Signed)
Cardiology at bedside.

## 2020-01-23 ENCOUNTER — Encounter (HOSPITAL_COMMUNITY): Payer: Self-pay | Admitting: Gastroenterology

## 2020-01-23 DIAGNOSIS — E119 Type 2 diabetes mellitus without complications: Secondary | ICD-10-CM

## 2020-01-23 DIAGNOSIS — J690 Pneumonitis due to inhalation of food and vomit: Secondary | ICD-10-CM | POA: Diagnosis not present

## 2020-01-23 DIAGNOSIS — I6302 Cerebral infarction due to thrombosis of basilar artery: Secondary | ICD-10-CM

## 2020-01-23 DIAGNOSIS — J9601 Acute respiratory failure with hypoxia: Secondary | ICD-10-CM | POA: Diagnosis not present

## 2020-01-23 DIAGNOSIS — E0822 Diabetes mellitus due to underlying condition with diabetic chronic kidney disease: Secondary | ICD-10-CM

## 2020-01-23 DIAGNOSIS — I4891 Unspecified atrial fibrillation: Secondary | ICD-10-CM | POA: Diagnosis not present

## 2020-01-23 DIAGNOSIS — N183 Chronic kidney disease, stage 3 unspecified: Secondary | ICD-10-CM | POA: Diagnosis not present

## 2020-01-23 DIAGNOSIS — R531 Weakness: Secondary | ICD-10-CM

## 2020-01-23 DIAGNOSIS — J189 Pneumonia, unspecified organism: Secondary | ICD-10-CM

## 2020-01-23 LAB — GLUCOSE, CAPILLARY
Glucose-Capillary: 110 mg/dL — ABNORMAL HIGH (ref 70–99)
Glucose-Capillary: 113 mg/dL — ABNORMAL HIGH (ref 70–99)
Glucose-Capillary: 117 mg/dL — ABNORMAL HIGH (ref 70–99)
Glucose-Capillary: 133 mg/dL — ABNORMAL HIGH (ref 70–99)
Glucose-Capillary: 152 mg/dL — ABNORMAL HIGH (ref 70–99)
Glucose-Capillary: 153 mg/dL — ABNORMAL HIGH (ref 70–99)
Glucose-Capillary: 170 mg/dL — ABNORMAL HIGH (ref 70–99)

## 2020-01-23 LAB — BASIC METABOLIC PANEL
Anion gap: 9 (ref 5–15)
BUN: 46 mg/dL — ABNORMAL HIGH (ref 8–23)
CO2: 22 mmol/L (ref 22–32)
Calcium: 8.3 mg/dL — ABNORMAL LOW (ref 8.9–10.3)
Chloride: 110 mmol/L (ref 98–111)
Creatinine, Ser: 3.02 mg/dL — ABNORMAL HIGH (ref 0.61–1.24)
GFR, Estimated: 18 mL/min — ABNORMAL LOW (ref >60)
Glucose, Bld: 115 mg/dL — ABNORMAL HIGH (ref 70–99)
Potassium: 3.9 mmol/L (ref 3.5–5.1)
Sodium: 141 mmol/L (ref 135–145)

## 2020-01-23 LAB — CBC
HCT: 23.6 % — ABNORMAL LOW (ref 39.0–52.0)
HCT: 25.1 % — ABNORMAL LOW (ref 39.0–52.0)
Hemoglobin: 7.3 g/dL — ABNORMAL LOW (ref 13.0–17.0)
Hemoglobin: 7.8 g/dL — ABNORMAL LOW (ref 13.0–17.0)
MCH: 25.8 pg — ABNORMAL LOW (ref 26.0–34.0)
MCH: 25.9 pg — ABNORMAL LOW (ref 26.0–34.0)
MCHC: 30.9 g/dL (ref 30.0–36.0)
MCHC: 31.1 g/dL (ref 30.0–36.0)
MCV: 83.4 fL (ref 80.0–100.0)
MCV: 83.4 fL (ref 80.0–100.0)
Platelets: 151 10*3/uL (ref 150–400)
Platelets: 182 10*3/uL (ref 150–400)
RBC: 2.83 MIL/uL — ABNORMAL LOW (ref 4.22–5.81)
RBC: 3.01 MIL/uL — ABNORMAL LOW (ref 4.22–5.81)
RDW: 14 % (ref 11.5–15.5)
RDW: 14 % (ref 11.5–15.5)
WBC: 14.1 10*3/uL — ABNORMAL HIGH (ref 4.0–10.5)
WBC: 16.2 10*3/uL — ABNORMAL HIGH (ref 4.0–10.5)
nRBC: 0 % (ref 0.0–0.2)
nRBC: 0 % (ref 0.0–0.2)

## 2020-01-23 LAB — LIPID PANEL
Cholesterol: 124 mg/dL (ref 0–200)
HDL: 64 mg/dL (ref >40)
LDL Cholesterol: 49 mg/dL (ref 0–99)
Total CHOL/HDL Ratio: 1.9 RATIO
Triglycerides: 57 mg/dL (ref <150)
VLDL: 11 mg/dL (ref 0–40)

## 2020-01-23 MED ORDER — ASPIRIN EC 81 MG PO TBEC
81.0000 mg | DELAYED_RELEASE_TABLET | Freq: Every day | ORAL | Status: DC
  Administered 2020-01-23 – 2020-01-24 (×2): 81 mg via ORAL
  Filled 2020-01-23 (×2): qty 1

## 2020-01-23 MED ORDER — AMOXICILLIN-POT CLAVULANATE 500-125 MG PO TABS
1.0000 | ORAL_TABLET | Freq: Two times a day (BID) | ORAL | Status: DC
  Administered 2020-01-23 – 2020-01-29 (×12): 500 mg via ORAL
  Filled 2020-01-23 (×13): qty 1

## 2020-01-23 MED ORDER — AMOXICILLIN-POT CLAVULANATE 875-125 MG PO TABS: 1.0000 | ORAL_TABLET | Freq: Two times a day (BID) | ORAL | Status: DC

## 2020-01-23 MED ORDER — CARVEDILOL 12.5 MG PO TABS
12.5000 mg | ORAL_TABLET | Freq: Two times a day (BID) | ORAL | Status: DC
  Administered 2020-01-23 – 2020-01-29 (×13): 12.5 mg via ORAL
  Filled 2020-01-23 (×13): qty 1

## 2020-01-23 NOTE — Consult Note (Signed)
Physical Medicine and Rehabilitation Consult   Reason for Consult: Functional decline  Referring Physician:  Dr. Andria Frames   HPI: Todd [name] is a 79 y.o. 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  After d/c to home he had issues with dizziness and falls due to hypotension and noted to have HR in 170-200 on evaluation by EMS.  History taken from chart review and patient.  He was cardioverted X 2 and found to have concerns of blown right pupil.  He was seen in ED and PERRL but had cogwheeling in R>L extremities and RLE weakness.   CT head unremarkable for acute intracranial process.  Patient was not a candidate for tPA. MRI/MRA brain done?  Subtle diffusion abnormality involving ventral left medulla question subtle infarct.   CT chest showed diffuse infiltrate consistent with extensive left-sided pneumonia.  He was started on IV antibiotics.  Cardiology, Dr. Einar Gip felt that elevated cardiac enzymes related to CV/Afib. Metabolic acidosis treated with IV fluids and Coreg titrated upwards for rate control. Therapy evaluation revealed ataxic gait with slurred speech. CIR recommended due to functional decline.   Review of Systems  Constitutional: Negative for fever.  HENT: Negative for hearing loss and tinnitus.   Eyes: Negative for blurred vision and double vision.  Respiratory: Negative for cough and shortness of breath.   Cardiovascular: Positive for leg swelling. Negative for chest pain and palpitations.  Gastrointestinal: Negative for constipation, heartburn and nausea.  Genitourinary: Negative for dysuria.  Musculoskeletal: Positive for back pain. Negative for myalgias.  Skin: Negative for rash.  Neurological: Positive for dizziness, tremors, speech change, focal weakness, weakness and headaches. Negative for sensory change.  Psychiatric/Behavioral: Negative for memory loss.  All other systems reviewed and are  negative.   Past Medical History:  Diagnosis Date  . Anemia   . Asthma    history of asthma  . Atrial fibrillation ([place])   . Chronic kidney disease   . Diabetes mellitus without complication ([place])    type 2 - no meds  . GERD (gastroesophageal reflux disease)   . HLD (hyperlipidemia)   . Hypertension   . Seasonal allergies   . TIA (transient ischemic attack) 06/15/2017    Past Surgical History:  Procedure Laterality Date  . COLONOSCOPY WITH ESOPHAGOGASTRODUODENOSCOPY (EGD)  04/2019  . ENDOSCOPIC MUCOSAL RESECTION  01/21/2020   Procedure: ENDOSCOPIC MUCOSAL RESECTION;  Surgeon: Rush Landmark Telford Nab., MD;  Location: [place];  Service: Gastroenterology;;  . ESOPHAGOGASTRODUODENOSCOPY N/A 10/17/2014   Procedure: ESOPHAGOGASTRODUODENOSCOPY (EGD);  Surgeon: Wilford Corner, MD;  Location: [facility] ENDOSCOPY;  Service: Endoscopy;  Laterality: N/A;  . ESOPHAGOGASTRODUODENOSCOPY (EGD) WITH PROPOFOL N/A 01/21/2020   Procedure: ESOPHAGOGASTRODUODENOSCOPY (EGD) WITH PROPOFOL;  Surgeon: Rush Landmark Telford Nab., MD;  Location: [place];  Service: Gastroenterology;  Laterality: N/A;  . HEMOSTASIS CLIP PLACEMENT  01/21/2020   Procedure: HEMOSTASIS CLIP PLACEMENT;  Surgeon: Irving Copas., MD;  Location: [place];  Service: Gastroenterology;;  . multiple tooth implants     pt thinks sedation  . SUBMUCOSAL LIFTING INJECTION  01/21/2020   Procedure: SUBMUCOSAL LIFTING INJECTION;  Surgeon: Rush Landmark Telford Nab., MD;  Location: [facility] ENDOSCOPY;  Service: Gastroenterology;;    Family History  Problem Relation Age of Onset  . Diabetes Mother   . Diabetes Father   . Stroke Father   . Colon cancer Father   . Diabetes Sister   . Stomach cancer Neg Hx   .  Pancreatic cancer Neg Hx   . Esophageal cancer Neg Hx   . Inflammatory bowel disease Neg Hx   . Liver disease Neg Hx     Social History:  Married. 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  Wife was an Hydrologist for [employer]. He reports that he has never smoked. He has never used smokeless tobacco. He reports that he does not drink alcohol and does not use drugs.    Allergies  Allergen Reactions  . Lisinopril Cough  . Metformin And Related Other (See Comments)    chronic kidney disease    Medications Prior to Admission  Medication Sig Dispense Refill  . albuterol (PROVENTIL HFA;VENTOLIN HFA) 108 (90 Base) MCG/ACT inhaler Inhale 2 puffs into the lungs every 6 (six) hours as needed for wheezing or shortness of breath.    Marland Kitchen amLODipine (NORVASC) 10 MG tablet Take 10 mg by mouth daily.    Marland Kitchen apixaban (ELIQUIS) 5 MG TABS tablet Take 1 tablet (5 mg total) by mouth 2 (two) times daily. 60 tablet 0  . atorvastatin (LIPITOR) 80 MG tablet Take 1 tablet (80 mg total) by mouth daily at 6 PM. 30 tablet 0  . calcitRIOL (ROCALTROL) 0.25 MCG capsule Take 0.25 mcg by mouth daily.    . cholecalciferol (VITAMIN D) 25 MCG (1000 UNIT) tablet Take 1,000 Units by mouth daily.    Marland Kitchen docusate sodium (COLACE) 50 MG capsule Take 100 mg by mouth daily as needed for mild constipation.    Marland Kitchen doxazosin (CARDURA) 8 MG tablet Take 4-8 mg by mouth See admin instructions. Tale 8 mg in the morning and 4 mg at bedtime    . ferrous sulfate 325 (65 FE) MG tablet Take 325 mg by mouth daily.     . finasteride (PROSCAR) 5 MG tablet Take 5 mg by mouth daily.    . fluticasone (FLONASE) 50 MCG/ACT nasal spray Place 2 sprays into both nostrils daily as needed for allergies or rhinitis.    . furosemide (LASIX) 40 MG tablet Take 40 mg by mouth See admin instructions. Take 40 mg by mouth daily in the morning  Take additional 40 mg by mouth at bedtime on Mon., Wed., and friday    . hydrALAZINE (APRESOLINE) 100 MG tablet Take 100 mg by mouth 3 (three) times daily.     Marland Kitchen lactulose (CHRONULAC) 10 GM/15ML solution Take 10 g by mouth daily as needed for mild constipation or moderate constipation.    Marland Kitchen loratadine (CLARITIN) 10 MG tablet Take 10 mg by mouth at  bedtime.     Marland Kitchen losartan (COZAAR) 100 MG tablet Take 100 mg by mouth daily.    Marland Kitchen omeprazole (PRILOSEC) 40 MG capsule Take 1 capsule (40 mg total) by mouth 2 (two) times daily before a meal. 60 capsule 4  . polycarbophil (FIBERCON) 625 MG tablet Take 625 mg by mouth daily.    . sennosides-docusate sodium (SENOKOT-S) 8.6-50 MG tablet Take 1 tablet by mouth 2 (two) times daily as needed for constipation.    . Sodium Phosphates (RA SALINE ENEMA RE) Place 1 application rectally as needed (take every other day as needed for constipation).    . sucralfate (CARAFATE) 1 GM/10ML suspension Take 10 mLs (1 g total) by mouth 2 (two) times daily. Take once during the day and before bedtime.  Try to no take any other medications 1 hour before or after use of Carafate. 420 mL 1  . vitamin B-12 (CYANOCOBALAMIN) 500 MCG tablet  Take 1,000 mcg by mouth daily.    Marland Kitchen NIFEdipine (ADALAT CC) 60 MG 24 hr tablet Take 60 mg by mouth daily. (Patient not taking: Reported on 01/23/2020)      Home: Home Living Family/patient expects to be discharged to:: Private residence Living Arrangements: Spouse/significant other Available Help at Discharge: Family, Available 24 hours/day Type of Home: House Home Access: Stairs to enter [employer] of Steps: 2 Entrance Stairs-Rails: Can reach both Home Layout: Two level Alternate Level Stairs-Number of Steps: 13 Alternate Level Stairs-Rails: Right, Left (split rail, half left and half right) Bathroom Shower/Tub: Chiropodist: Standard Home Equipment: Environmental consultant - 2 wheels, Cane - single point, Grab bars - toilet, Grab bars - tub/shower  Lives With: Spouse  Functional History: Prior Function Level of Independence: Independent, Independent with assistive device(s) Comments: pt reports use of cane intermittently when outdoors Functional Status:  Mobility: Bed Mobility Overal bed mobility: Needs Assistance Bed Mobility: Supine to Sit Supine to sit:  Supervision, HOB elevated General bed mobility comments: extra time and effort. pt utilized bed rail and a bit of momentum Transfers Overall transfer level: Needs assistance Equipment used: Rolling walker (2 wheeled) Transfers: Sit to/from Stand Sit to Stand: Mod assist, Min assist General transfer comment: mod A on initial stand from EOB, min A to steady/control descent into recliner. Cues for hand placement and technique with rw. Ambulation/Gait Ambulation/Gait assistance: Min guard Gait Distance (Feet): 12 Feet Assistive device: Rolling walker (2 wheeled) Gait Pattern/deviations: Step-to pattern, Decreased stance time - right, Ataxic General Gait Details: narrowed stance width with RLE scissoring Gait velocity: reduced Gait velocity interpretation: <1.31 ft/sec, indicative of household ambulator    ADL: ADL Overall ADL's : Needs assistance/impaired Eating/Feeding: Set up, Sitting Grooming: Minimal assistance, Sitting Upper Body Bathing: Minimal assistance, Sitting Lower Body Bathing: Moderate assistance, Sit to/from stand Upper Body Dressing : Minimal assistance, Sitting Lower Body Dressing: Moderate assistance, Sit to/from stand Toilet Transfer: Minimal assistance, Ambulation, RW Toileting- Clothing Manipulation and Hygiene: Moderate assistance, Sit to/from stand Tub/ Shower Transfer: Tub transfer, Moderate assistance, Ambulation, Rolling walker, 3 in 1 Functional mobility during ADLs: Minimal assistance, Rolling walker, Min guard General ADL Comments: Assist to powerup to standing and steady descent into sitting. Mostly min guard for functional mobility but light min A to steady on turns.   Cognition: Cognition Overall Cognitive Status: No family/caregiver present to determine baseline cognitive functioning Arousal/Alertness: Awake/alert Orientation Level: Oriented to person, Oriented to place, Oriented to situation Attention: Focused, Sustained Focused Attention: Appears  intact Sustained Attention: Appears intact Memory: Impaired Memory Impairment: Retrieval deficit, Decreased recall of new information (Immediate: 3/3; delayed: 2/3; with cues: 1/1) Awareness: Appears intact Problem Solving: Appears intact Executive Function: Reasoning Reasoning: Impaired Reasoning Impairment: Verbal complex Cognition Arousal/Alertness: Awake/alert Behavior During Therapy: Flat affect, WFL for tasks assessed/performed Overall Cognitive Status: No family/caregiver present to determine baseline cognitive functioning General Comments: slow processing, delayed responses/decreased initiation at times. Flat affect.  Blood pressure (!) 129/100, pulse 65, temperature 100 F (37.8 C), temperature source Oral, resp. rate 17, height 6\' 1"  (1.854 m), weight 103.7 kg, SpO2 100 %. Physical Exam Vitals and nursing note reviewed.  Constitutional:      General: He is not in acute distress.    Appearance: Normal appearance. He is obese.  HENT:     Head: Normocephalic and atraumatic.     Right Ear: External ear normal.     Left Ear: External ear normal.     Nose: Nose normal.  Eyes:     General:        Right eye: No discharge.        Left eye: No discharge.     Extraocular Movements: Extraocular movements intact.  Cardiovascular:     Rate and Rhythm: Normal rate and regular rhythm.  Pulmonary:     Effort: Pulmonary effort is normal. No respiratory distress.     Breath sounds: No stridor.  Abdominal:     General: Bowel sounds are normal. There is no distension.  Musculoskeletal:     Cervical back: Normal range of motion and neck supple.     Right lower leg: Edema present.     Left lower leg: Edema present.     Comments: Right upper extremity edema  Skin:    General: Skin is warm and dry.  Neurological:     Mental Status: He is alert and oriented to person, place, and time.     Comments: Alert Dysarthria Resting tremor Follow simple motor commands without difficulty.   Motor: LUE/LLE: 5/5 proximal distal RUE: 4 -/5 proximal distal RLE: 4/5 proximal distal  Psychiatric:        Mood and Affect: Mood normal.        Behavior: Behavior normal.     Results for orders placed or performed during the hospital encounter of 01/21/20 (from the past 24 hour(s))  CBC     Status: Abnormal   Collection Time: 01/23/20  4:31 PM  Result Value Ref Range   WBC 16.2 (H) 4.0 - 10.5 K/uL   RBC 3.01 (L) 4.22 - 5.81 MIL/uL   Hemoglobin 7.8 (L) 13.0 - 17.0 g/dL   HCT 25.1 (L) 39 - 52 %   MCV 83.4 80.0 - 100.0 fL   MCH 25.9 (L) 26.0 - 34.0 pg   MCHC 31.1 30.0 - 36.0 g/dL   RDW 14.0 11.5 - 15.5 %   Platelets 182 150 - 400 K/uL   nRBC 0.0 0.0 - 0.2 %  Glucose, capillary     Status: Abnormal   Collection Time: 01/23/20  5:07 PM  Result Value Ref Range   Glucose-Capillary 153 (H) 70 - 99 mg/dL   Comment 1 Notify RN    Comment 2 Document in Chart   Glucose, capillary     Status: Abnormal   Collection Time: 01/23/20  7:54 PM  Result Value Ref Range   Glucose-Capillary 170 (H) 70 - 99 mg/dL  Glucose, capillary     Status: Abnormal   Collection Time: 01/23/20 11:57 PM  Result Value Ref Range   Glucose-Capillary 133 (H) 70 - 99 mg/dL  Glucose, capillary     Status: Abnormal   Collection Time: 01/24/20  4:34 AM  Result Value Ref Range   Glucose-Capillary 108 (H) 70 - 99 mg/dL  CBC     Status: Abnormal   Collection Time: 01/24/20  5:36 AM  Result Value Ref Range   WBC 11.2 (H) 4.0 - 10.5 K/uL   RBC 2.58 (L) 4.22 - 5.81 MIL/uL   Hemoglobin 7.0 (L) 13.0 - 17.0 g/dL   HCT 21.9 (L) 39 - 52 %   MCV 84.9 80.0 - 100.0 fL   MCH 27.1 26.0 - 34.0 pg   MCHC 32.0 30.0 - 36.0 g/dL   RDW 14.2 11.5 - 15.5 %   Platelets 143 (L) 150 - 400 K/uL   nRBC 0.0 0.0 - 0.2 %  Glucose, capillary     Status: Abnormal   Collection Time: 01/24/20  7:48 AM  Result Value Ref Range   Glucose-Capillary 104 (H) 70 - 99 mg/dL  Basic metabolic panel     Status: Abnormal   Collection Time:  01/24/20  8:05 AM  Result Value Ref Range   Sodium 141 135 - 145 mmol/L   Potassium 4.3 3.5 - 5.1 mmol/L   Chloride 111 98 - 111 mmol/L   CO2 20 (L) 22 - 32 mmol/L   Glucose, Bld 123 (H) 70 - 99 mg/dL   BUN 49 (H) 8 - 23 mg/dL   Creatinine, Ser 2.96 (H) 0.61 - 1.24 mg/dL   Calcium 8.4 (L) 8.9 - 10.3 mg/dL   GFR, Estimated 21 (L) >60 mL/min   Anion gap 10 5 - 15  CBC     Status: Abnormal   Collection Time: 01/24/20  8:35 AM  Result Value Ref Range   WBC 11.4 (H) 4.0 - 10.5 K/uL   RBC 2.79 (L) 4.22 - 5.81 MIL/uL   Hemoglobin 7.4 (L) 13.0 - 17.0 g/dL   HCT 23.4 (L) 39 - 52 %   MCV 83.9 80.0 - 100.0 fL   MCH 26.5 26.0 - 34.0 pg   MCHC 31.6 30.0 - 36.0 g/dL   RDW 14.0 11.5 - 15.5 %   Platelets 161 150 - 400 K/uL   nRBC 0.0 0.0 - 0.2 %  Glucose, capillary     Status: Abnormal   Collection Time: 01/24/20 11:54 AM  Result Value Ref Range   Glucose-Capillary 123 (H) 70 - 99 mg/dL   No results found.  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  Stroke: Continue secondary stroke prophylaxis and Risk Factor Modification listed below:   Antiplatelet therapy:   Blood Pressure Management:  Continue current medication with prn's with permisive HTN per primary team Statin Agent:   Diabetes management:   Right sided hemiparesis: fit for orthosis to prevent contractures (resting hand splint for day, wrist cock up splint at night, PRAFO, etc) PT/OT for mobility, ADL training  Motor recovery: Fluoxetine Labs independently reviewed.  Records reviewed and summated above.  1. Does the need for close, 24 hr/day medical supervision in concert with the patient's rehab needs make it unreasonable for this patient to be served in a less intensive setting? Yes  2. Co-Morbidities requiring supervision/potential complications: T2 DM with hyperglycemia (Monitor in accordance with exercise and adjust meds as necessary), CKD (avoid nephrotoxic meds, repeat labs), CAF, TIA, chronic  back pain (Biofeedback training with therapies to help reduce reliance on opiate pain medications, monitor pain control during therapies, and sedation at rest and titrate to maximum efficacy to ensure participation and gains in therapies), gastric polyps s/p endoscopic resection, leukocytosis (repeat labs, cont to monitor for signs and symptoms of infection, further workup if indicated) 3. Due to safety, disease management and patient education, does the patient require 24 hr/day rehab nursing? Yes 4. Does the patient require coordinated care of a physician, rehab nurse, therapy disciplines of PT/OT to address physical and functional deficits in the context of the above medical diagnosis(es)? Yes Addressing deficits in the following areas: balance, endurance, locomotion, strength, transferring, bathing, dressing, toileting and psychosocial support 5. Can the patient actively participate in an intensive therapy program of at least 3 hrs of therapy per day at least 5 days per week? Yes 6. The potential for patient to make measurable gains while on inpatient rehab is excellent and good 7. Anticipated functional outcomes upon discharge from inpatient rehab are modified independent and supervision  with PT, modified independent and supervision with OT, n/a with SLP. 8. Estimated rehab length of stay to reach the above functional goals is: 7-12 days. 9. Anticipated discharge destination: Home 10. Overall Rehab/Functional Prognosis: good  RECOMMENDATIONS: This patient's condition is appropriate for continued rehabilitative care in the following setting: CIR patient does not progress to supervision level of functioning. Patient has agreed to participate in recommended program. Yes Note that insurance prior authorization may be required for reimbursement for recommended care.  Comment: Rehab Admissions Coordinator to follow up.  I have personally performed a face to face diagnostic evaluation, including,  but not limited to relevant history and physical exam findings, of this patient and developed relevant assessment and plan.  Additionally, I have reviewed and concur with the physician assistant's documentation above.   Delice Lesch, MD, ABPMR Bary Leriche, PA-C 01/24/2020

## 2020-01-23 NOTE — Progress Notes (Signed)
SLP Cancellation Note  Patient Details Name: [name] MRN: [id number] DOB: [birth year]   Cancelled treatment:       Reason Eval/Treat Not Completed: Patient at procedure or test/unavailable (Pt currently with MD. SLP will f/u)  Tobie Poet I. Hardin Negus, [place], [place] Office number [phone] Pager [place] 01/23/2020, 9:11 AM

## 2020-01-23 NOTE — Progress Notes (Signed)
Inpatient Rehab Admissions Coordinator Note:   Per therapy recommendations, pt was screened for CIR candidacy by Shann Medal, PT, DPT.  At this time we are recommending a CIR consult and I will request an order per our protocol.  Note pt has Medicare A and B as well as [facility].  Please contact me with questions.   Shann Medal, PT, DPT [phone] 01/23/20 2:51 PM

## 2020-01-23 NOTE — Evaluation (Signed)
Physical Therapy Evaluation Patient Details Name: Todd [name] MRN: [id number] DOB: 1940/09/24 Today's Date: 01/23/2020   History of Present Illness  79 y.o. male presenting with altered mental status and blown pupils shortly after EGD concerning for acute CVA and acute hypoxemic respiratory failure likely secondary to pneumonia. PMH is significant for paroxysmal A Fib (on apixaban), TIA, T2DM, HTN, HLD, CKD, GERD, iron deficiency anemia. CT head without contrast without evidence of ICH. MRI of the brain did reveal potential subtle acute ischemic infarct in the ventral left medulla versus artifact.  Clinical Impression  Pt presents to PT with deficits in functional mobility, gait, balance, endurance, strength, power, ROM, and cognition. Pt is generally weak in BLE and demonstrates some impaired coordination of RUE and RLE with functional mobility. Pt requires assistance to power up into standing and remains at an increased risk for falls due to RLE coordination deficits. Pt will benefit from continued acute PT POC to improve independence in transfers and gait. PT recommends CIR placement at this time as the pt demonstrate the potential to return to independent mobility with high intensity inpatient PT services.    Follow Up Recommendations CIR    Equipment Recommendations   (TBD)    Recommendations for Other Services Rehab consult     Precautions / Restrictions Precautions Precautions: Fall Restrictions Weight Bearing Restrictions: No      Mobility  Bed Mobility Overal bed mobility: Needs Assistance Bed Mobility: Supine to Sit     Supine to sit: Supervision;HOB elevated (use of bed rail)          Transfers Overall transfer level: Needs assistance Equipment used: Rolling walker (2 wheeled) Transfers: Sit to/from Stand Sit to Stand: Mod assist;Min assist         General transfer comment: modA from edge of bed, minA on 2nd attempt from  recliner  Ambulation/Gait Ambulation/Gait assistance: Min guard Gait Distance (Feet): 12 Feet Assistive device: Rolling walker (2 wheeled) Gait Pattern/deviations: Step-to pattern;Decreased stance time - right;Ataxic Gait velocity: reduced Gait velocity interpretation: <1.31 ft/sec, indicative of household ambulator General Gait Details: narrowed stance width with RLE scissoring  Stairs            Wheelchair Mobility    Modified Rankin (Stroke Patients Only) Modified Rankin (Stroke Patients Only) Pre-Morbid Rankin Score: No significant disability Modified Rankin: Moderately severe disability     Balance Overall balance assessment: Needs assistance Sitting-balance support: No upper extremity supported;Feet supported Sitting balance-Leahy Scale: Good Sitting balance - Comments: supervision without UE support   Standing balance support: Single extremity supported;Bilateral upper extremity supported Standing balance-Leahy Scale: Poor Standing balance comment: reliant on UE support of RW and minG                             Pertinent Vitals/Pain Pain Assessment: No/denies pain    Home Living Family/patient expects to be discharged to:: Private residence Living Arrangements: Spouse/significant other Available Help at Discharge: Family;Available 24 hours/day Type of Home: House Home Access: Stairs to enter Entrance Stairs-Rails: Can reach both Entrance Stairs-Number of Steps: 2 Home Layout: Two level Home Equipment: Walker - 2 wheels;Cane - single point;Grab bars - toilet;Grab bars - tub/shower      Prior Function Level of Independence: Independent;Independent with assistive device(s)         Comments: pt reports use of cane intermittently when outdoors     Hand Dominance   Dominant Hand: Right    Extremity/Trunk Assessment  Upper Extremity Assessment Upper Extremity Assessment: RUE deficits/detail;LUE deficits/detail RUE Deficits / Details:  RUE active shoulder flexion limited to ~90 degrees, passive shoulder flexion to ~110. elbow flexion/extension 4/5, grip 4/5 RUE Sensation: decreased light touch RUE Coordination: decreased fine motor LUE Deficits / Details: RUE active shoulder flexion limited to ~90 degrees, passive shoulder flexion to ~100. elbow flexion/extension 4/5, grip 4/5 LUE Sensation: WNL    Lower Extremity Assessment Lower Extremity Assessment: Generalized weakness    Cervical / Trunk Assessment Cervical / Trunk Assessment: Normal  Communication   Communication: No difficulties  Cognition Arousal/Alertness: Awake/alert Behavior During Therapy: WFL for tasks assessed/performed Overall Cognitive Status: No family/caregiver present to determine baseline cognitive functioning                                 General Comments: pt with slowed processing, otherwise alert and oriented x4, good awareness of deficits      General Comments General comments (skin integrity, edema, etc.): VSS on RA    Exercises     Assessment/Plan    PT Assessment Patient needs continued PT services  PT Problem List Decreased strength;Decreased range of motion;Decreased activity tolerance;Decreased balance;Decreased mobility;Decreased coordination;Decreased knowledge of use of DME;Impaired sensation       PT Treatment Interventions DME instruction;Gait training;Stair training;Functional mobility training;Therapeutic activities;Balance training;Neuromuscular re-education;Patient/family education    PT Goals (Current goals can be found in the Care Plan section)  Acute Rehab PT Goals Patient Stated Goal: To return to independence PT Goal Formulation: With patient Time For Goal Achievement: 02/06/20 Potential to Achieve Goals: Good    Frequency Min 4X/week   Barriers to discharge        Co-evaluation               AM-PAC PT "6 Clicks" Mobility  Outcome Measure Help needed turning from your back to  your side while in a flat bed without using bedrails?: None Help needed moving from lying on your back to sitting on the side of a flat bed without using bedrails?: A Little Help needed moving to and from a bed to a chair (including a wheelchair)?: A Little Help needed standing up from a chair using your arms (e.g., wheelchair or bedside chair)?: A Little Help needed to walk in hospital room?: A Little Help needed climbing 3-5 steps with a railing? : Total 6 Click Score: 17    End of Session   Activity Tolerance: Patient tolerated treatment well Patient left: in chair;with call bell/phone within reach;with chair alarm set Nurse Communication: Mobility status PT Visit Diagnosis: Other abnormalities of gait and mobility (R26.89);Muscle weakness (generalized) (M62.81);Other symptoms and signs involving the nervous system (R29.898)    Time: 2841-3244 PT Time Calculation (min) (ACUTE ONLY): 33 min   Charges:   PT Evaluation $PT Eval Moderate Complexity: 1 Mod PT Treatments $Therapeutic Activity: 8-22 mins        Zenaida Niece, PT, DPT Acute Rehabilitation Pager: [phone]   Zenaida Niece 01/23/2020, 12:19 PM

## 2020-01-23 NOTE — Progress Notes (Signed)
STROKE TEAM PROGRESS NOTE   INTERVAL HISTORY No family at bedside.  Patient sitting in chair, no acute event overnight, no complaints.  Still has very mild right-sided weakness.  MRI showed possible left paramedian medullary small infarcts.  OBJECTIVE Vitals:   01/23/20 0015 01/23/20 0334 01/23/20 0738 01/23/20 0931  BP: (!) 146/74 137/75 (!) 152/93 (!) 171/74  Pulse: 84 78 98 81  Resp: 16 18 20    Temp: 98.6 F (37 C) 98.3 F (36.8 C) 97.9 F (36.6 C)   TempSrc: Oral Oral Oral   SpO2: 100% 98% 96%   Weight: 103.7 kg     Height: 6\' 1"  (1.854 m)      CBC:  Recent Labs  Lab 01/21/20 1620 01/22/20 0244 01/22/20 1714 01/23/20 0452  WBC 7.7   < > 16.1* 14.1*  NEUTROABS 5.5  --   --   --   HGB 8.2*   < > 7.4* 7.3*  HCT 28.5*   < > 24.3* 23.6*  MCV 89.3   < > 85.9 83.4  PLT 200   < > 170 151   < > = values in this interval not displayed.   Basic Metabolic Panel:  Recent Labs  Lab 01/22/20 0244 01/23/20 0452  NA 143 141  K 3.6 3.9  CL 109 110  CO2 21* 22  GLUCOSE 146* 115*  BUN 44* 46*  CREATININE 3.42* 3.02*  CALCIUM 8.6* 8.3*   Lipid Panel:     Component Value Date/Time   CHOL 124 01/23/2020 1005   TRIG 57 01/23/2020 1005   HDL 64 01/23/2020 1005   CHOLHDL 1.9 01/23/2020 1005   VLDL 11 01/23/2020 1005   LDLCALC 49 01/23/2020 1005   HgbA1c:  Lab Results  Component Value Date   HGBA1C 7.6 (H) 06/15/2017   Urine Drug Screen: No results found for: LABOPIA, COCAINSCRNUR, LABBENZ, AMPHETMU, THCU, LABBARB  Alcohol Level No results found for: [facility]  IMAGING  CT Chest Wo Contrast10/18/2021 IMPRESSION:  Diffuse left-sided pneumonia. Cholelithiasis without complicating factors. Aortic Atherosclerosis (ICD10-I70.0).   MR BRAIN WO CONTRAST 01/21/2020 IMPRESSION:  MRI HEAD  IMPRESSION:  1. Subtle 4 mm focus of diffusion abnormality involving the ventral left medulla. While this finding could be artifactual in nature, a possible subtle acute ischemic infarct is  difficult to exclude, and could be considered in the correct clinical setting. No associated hemorrhage or mass effect.  2. Otherwise negative brain MRI for age.   MRA HEAD  IMPRESSION:  1. Mildly motion degraded exam.  2. Negative intracranial MRA for large vessel occlusion. No hemodynamically significant or correctable stenosis.   DG Chest Portable 1 View 01/21/2020 IMPRESSION:  Marked severity left-sided infiltrate.   ECHOCARDIOGRAM COMPLETE 01/22/2020 IMPRESSIONS   1. Left ventricular ejection fraction, by estimation, is 55 to 60%. The left ventricle has normal function. The left ventricle has no regional wall motion abnormalities. Left ventricular diastolic parameters are consistent with Grade III diastolic dysfunction (restrictive).   2. Right ventricular systolic function is normal. The right ventricular size is normal.   3. Left atrial size was severely dilated.   4. Right atrial size was mildly dilated.   5. The mitral valve is grossly normal. Trivial mitral valve regurgitation.   6. The aortic valve is grossly normal. Aortic valve regurgitation is not visualized. No aortic stenosis is present.   7. The inferior vena cava is dilated in size with <50% respiratory variability, suggesting right atrial pressure of 15 mmHg.   CT HEAD CODE  STROKE WO CONTRAST 01/21/2020 IMPRESSION:  1. No acute intracranial abnormality  2. ASPECTS is 10  3. Motion degraded study.   VAS US CAROTID 01/23/2020 Summary:  Right Carotid: Velocities in the right ICA are consistent with a 1-39% stenosis.  Left Carotid: Velocities in the left ICA are consistent with a 1-39% stenosis.  Vertebrals: Bilateral vertebral arteries demonstrate antegrade flow.   ECG - SR rate 84 BPM. (See cardiology reading for complete details)   PHYSICAL EXAM  Temp:  [97.9 F (36.6 C)-98.6 F (37 C)] 98.2 F (36.8 C) (10/20 1116) Pulse Rate:  [76-98] 76 (10/20 1116) Resp:  [14-20] 16 (10/20 1116) BP:  (134-171)/(64-93) 147/73 (10/20 1116) SpO2:  [94 %-100 %] 99 % (10/20 1116) Weight:  [103.7 kg] 103.7 kg (10/20 0015)  General - Well nourished, well developed, in no apparent distress.  Ophthalmologic - fundi not visualized due to noncooperation.  Cardiovascular - irregularly irregular heart rate and rhythm.  Neuro - awake alert, eyes open, orientated to place, month and age, not orientated to the year.  No aphasia, paucity of speech, able to name and repeat, follow all simple commands.  PERRL, EOMI.  Visual field full.  Right mild nasolabial fold flattening.  Tongue midline.  Right upper extremity pronator drift and decreased hand dexterity.  Right lower extremity decreased knee extension and right foot DF/PF.  Left upper and lower extremity 5/5.  Sensation symmetrical bilaterally.  Finger-to-nose intact but slow on the right side.  Gait not tested   ASSESSMENT/PLAN [name] is a 79 y.o. male with history of TIA, hypertension, office eval Dr Leonie Man for dementia, hyperlipidemia, CKD, diabetes, atrial fibrillation - on Eliquis however was held for 2 days for an endoscopy performed 01/21/20 - back home pt became dizzy and light headed - EMS was called and found pt poorly responsive with tachycardia 170-200, SBP - 70s -> EMS cardioverted him x 2. On exam he had cogwheeling in BL arms and legs, R worse than left and RLE weakness. Pt became hypoxic - sats mid 80's - while lying flat for CT scan. He did not receive IV t-PA due to concern for possible bleeding in the setting of recent endoscopy and hypotension. NIH was 2.   Stroke: L paramedian medullary small infarct, likely small vessel disease in the setting of sepsis and septic shock. Less likely afib this time.  CT Head -  No acute intracranial abnormality. ASPECTS is 10.   MRI head - Subtle 4 mm focus of diffusion abnormality involving the ventral left medulla. No associated hemorrhage or mass effect.  MRA head - Negative intracranial  MRA for large vessel occlusion. No hemodynamically significant or correctable stenosis.   Carotid Doppler - unremarkable  2D Echo - EF 55 - 60%. No cardiac source of emboli identified.   [employer] Virus 2 - negative  LDL - 49  HgbA1c - pending  VTE prophylaxis - Eliquis  Eliquis 5 mg bid was on hold for 2 days for endoscopy prior to admission, now on aspirin 81 mg daily and Eliquis 2.5 mg bid.  Continue on discharge  Therapy recommendations:  CIR  Disposition:  Pending  Atrial Fibrillation w/ RVR  Home anticoagulation:  Eliquis 5 mg twice daily but on hold for 2 days PTA for endoscopy . Back on Eliquis, however dosing corrected to 2.5 twice daily   Acute Hypoxemic Respiratory Failure Aspiration pneumonia  Leukocytosis  Septic shock  BP 70s on presentation in the setting of tachycardia and hypoxia  CT Chest - Diffuse left-sided pneumonia. Cholelithiasis without complicating factors.  WBC's - 15.6->16.1->14.1  (afebrile)   Unasyn 10/18>>Augmentin 10/19>>  Management per primary team  Hypertension  Home BP meds: amlodipine ; Cozaar ; hydralazine ; Cardura  Current BP meds: Carvedilol   Stable . 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 Please call with questions. Pt will follow up with stroke clinic NP at [facility] in about 4 weeks. Thanks for the consult.  Rosalin Hawking, MD PhD Stroke Neurology 01/23/2020 3:54 PM  To contact Stroke Continuity provider, please refer to [email or web address] After hours, contact General Neurology

## 2020-01-23 NOTE — Progress Notes (Signed)
Family Medicine Teaching Service Daily Progress Note Intern Pager: [phone]  Patient name: Todd [name] Medical record number: [id number] Date of birth: Sep 02, 1940 Age: 79 y.o. Gender: male  Primary Care Provider: Charlsie Merles, MD Consultants: Neurology, Cardiology Code Status: Full  Pt Overview and Major Events to Date:  10/18 Admitted  Assessment and Plan:  Todd [name] a 79 y.o.malepresenting with altered mental status and blown pupils shortly after EGD concerning for acute CVA and acute hypoxemic respiratory failure likely secondary to pneumonia. PMH is significant forparoxysmal A Fib (on apixaban), TIA, T2DM, HTN, HLD, CKD, GERD, iron deficiency anemia.   Acute hypoxemic respiratory failure possible aspiration pneumonia Patient underwent EGD 10/18 later was weak and foundunresponsivewith agonal breathing.CXR showed marked left-sided infiltrate concerning for pneumonia. CT chest was also obtained with findings consistent with diffuse left-sided pneumonia. Must consider PE causing acute hypoxia and hemodynamic instability as pt has been off anticoagulation for >3 days. Wells score for PE 4.5 (moderate risk),continues to denie chest pain or SOB, continues to sat well on room air. Will transition to oral antibiotics for a total of 10 days of treatment. -Will stop Unasyn 1.5 g q12h (Day 2) -Start Augmentin 500-125 mg BID (Day 1 of 8) -Continue cardiac monitoring and continuous pulse oximetry -Am CBC, CMP -Monitor fever curve will obtain blood cultures if  Fever appears -Consider CTA to rule out PE if symptoms worsen   Concern for left medullaryCVA As above, patient was found to be unresponsive with blownrightpupil about 2 hours after EGD procedure, code stroke was called upon arrival to the ED.Blown pupil was not noted in the ED.Neurology was consulted. CT head without contrast without evidence of ICH. MRI of the brain did reveal potential subtle acute ischemic  infarct in the ventral left medulla versus artifact. Neurology will continue to follow before making a diagnosis of CVA. Of note,patient's anticoagulation was held for a few days for procedure today (wife states his last dose of apixaban was this past Friday 10/15). Has history of TIA 06/2017.Stroke swallow screen passedin the ED. Carotid US shows no blockages. Today Right leg is able to be held up against light resistance. -Appreciate Neurology recommendations -Monitor limb weakness with serial exams -Neurology recommends normotensive BP goal -Hold all home anti-hypertensives -Lipid panel, HbA1c   AFib with RVR A fib in RVRto 130in the ED, rate controlled s/p10 mg IVdiltiazem.Tachycardic up to 200 prior to arrival s/p cardioversions x2 without significant change. Restarted Carvedilol and Eliquis yesterday and will continue to monitor. -Appreciate Cardiology recommendations -Continue 2.5 BIDgiven pt has been off anticoagulation for >3 days and risk of stroke outweighs risk of acute hemorrhage -Increase Carvedilol to 12.5 mg BID -Cardiac monitoring   Anion gap metabolic acidosis On admission elevated lactate 5.5.with anion gap metabolic acidosis (bicarb 16, AG 17). This morning anion gap is 13 and Lactic Acid is 1.3. -Trend lactate -Continue IVFfluids LR 166ml/hrx 12h   Hypokalemia K 3.3on admission and repleted withKCl 40 mEq. K this morning is 3.9. -Monitor with daily BMP -Replete as necessary   HTN  Home meds:amlodipine 10 mg, doxazosin (8 mg AM, 4 mg bedtime), hydralazine 100 mg TID, losartan 100 mg. Have clarified with patient that he once took Nifedipine but stopped, he is taking all others. BP have continued to remain normotensive BP today is 152/93. 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 BNP 185.1. Home medication is Furosemide 40 mg MWF. S/p IV furosemide 40 mg in the ED. Echo shows improved EF of 55-60%. -Holding furosemide (permissive HTN) -strict I/O -Daily weights   HLD Home meds: atorvastatin 80 mg. -Atorvastatin 80mg  once daily -Follow up lipid panel   Asthma Home meds: albuterol prn -Continue albuterol prn   Diabetes Last A1c7.6 in 2019.Not on any antihyperglycemic agents. - f/u HbA1c -CBGs Q4H -Start sSSI if needed   AKI onCKD Cr3.30 on admission.Baseline appears to be around 2.6-2.7. Follows with nephrology at the [facility] in [place]. Creatinine today is 3.02. 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 Home meds: ferrous sulfate 325 mg daily, vitamin B12 1000 mcg daily. Hgb today is 7.3. Continue to monitor. -ContinueFerrous Sulphate 325mg  daily -Clarify transfusion threshold -Monitor Hgb q12 to ensure stability   GERD Home meds: omeprazole 40 mg BID, sucralfate 1 g BID. -Continuepantoprazole40mg per formulary   BPH Home meds: doxazosin (8 mg AM, 4 mg PM), finasteride 5 mg. -Holdingboth doxazosin and finasteride(can cause hypotension)   FEN/GI: Heart Healthy  PPx: Eliquis  Disposition: Tele Med  Prior to Admission Living Arrangement: Home Anticipated Discharge Location: Home Barriers to Discharge: Medical Work up Anticipated discharge in approximately 2-4 day(s).   Subjective:  Interviewed patient at bedside.  He reports feeling well today. Reports no chest pain or SOB. He has no other complaints.   Objective: Temp:  [98.3 F (36.8 C)-98.6 F (37 C)] 98.3 F (36.8 C) (10/20 0334) Pulse Rate:  [69-93] 78 (10/20 0334) Resp:  [13-20] 18 (10/20 0334) BP: (122-151)/(59-131) 137/75 (10/20 0334) SpO2:  [94 %-100 %] 98 % (10/20 0334) Weight:  [228 lb 9.9 oz (103.7 kg)] 228 lb 9.9 oz (103.7 kg) (10/20  0015) Physical Exam:  Physical Exam Vitals and nursing note reviewed.  Constitutional:      General: He is not in acute distress.    Appearance: Normal appearance. He is normal weight. He is not ill-appearing, toxic-appearing or diaphoretic.  HENT:     Head: Normocephalic and atraumatic.  Cardiovascular:     Rate and Rhythm: Normal rate and regular rhythm.     Pulses: Normal pulses.          Radial pulses are 2+ on the right side and 2+ on the left side.       Dorsalis pedis pulses are 2+ on the right side and 2+ on the left side.     Heart sounds: Normal heart sounds, S1 normal and S2 normal. No murmur heard.   Pulmonary:     Effort: Pulmonary effort is normal. No respiratory distress.     Breath sounds: Normal breath sounds. No wheezing.  Abdominal:     General: There is no distension.     Palpations: There is no mass.     Tenderness: There is no abdominal tenderness.  Musculoskeletal:     Right lower leg: No edema.     Left lower leg: No edema.  Neurological:     Mental Status: He is alert.      Laboratory: Recent Labs  Lab 01/22/20 0244 01/22/20 1714 01/23/20 0452  WBC 15.6* 16.1* 14.1*  HGB 7.7* 7.4* 7.3*  HCT 25.7* 24.3* 23.6*  PLT 175 170 151   Recent Labs  Lab 01/21/20 1619 01/21/20 1620 01/22/20 0244  NA 142 144 143  K 3.3* 3.3* 3.6  CL 113* 111 109  CO2  --  16* 21*  BUN  39* 42* 44*  CREATININE 3.30* 3.32* 3.42*  CALCIUM  --  8.6* 8.6*  PROT  --  5.9* 6.1*  BILITOT  --  0.8 0.7  ALKPHOS  --  43 36*  ALT  --  16 19  AST  --  20 44*  GLUCOSE 185* 189* 146*    Trop: 872, 998  Imaging/Diagnostic Tests:   Echo: EF 55-60%. Concentric Remodeling noted. Grade III diastolic dysfunction No wall motion abnormalities. Left Atrium severely dilated. Right Atrium mildly dilated. All other structures grossly normal.   VAS US Carotid: Right Carotid: Velocities in the right ICA are consistent with a 1-39%  Stenosis. Left Carotid: Velocities in the left  ICA are consistent with a 1-39% stenosis. Vertebrals: Bilateral vertebral arteries demonstrate antegrade flow.   MR Angio Head WO Contrast: Mildly motion degraded exam. Negative intracranial MRA for large vessel occlusion. No hemodynamically significant or correctable stenosis.   MR Brain WO Contrast: Mildly motion degraded exam. Negative intracranial MRA for large vessel occlusion. No hemodynamically significant or correctable stenosis.   CT Chest WO Contrast: Diffuse left-sided pneumonia. Cholelithiasis without complicating factors.   DG Chest Portable 1 View: Marked severity left-sided infiltrate.   CT Head Code Stroke WO Contrast: No acute intracranial abnormality. ASPECTS is 10. Motion degraded study.   Briant Cedar, MD 01/23/2020, 6:16 AM PGY-1, [place] Intern pager: [phone], text pages welcome

## 2020-01-23 NOTE — Evaluation (Signed)
Speech Language Pathology Evaluation Patient Details Name: Todd [name] MRN: [id number] DOB: May 16, 1940 Today's Date: 01/23/2020 Time: 2993-7169 SLP Time Calculation (min) (ACUTE ONLY): 20 min  Problem List:  Patient Active Problem List   Diagnosis Date Noted  . Respiratory failure, acute-on-chronic ([place]) 01/22/2020  . Stroke ([place]) 01/21/2020  . Normocytic anemia 06/14/2019  . Hyperplastic polyp of stomach 06/14/2019  . Polyp of small intestine 06/14/2019  . Hx of adenomatous colonic polyps 06/14/2019  . GERD (gastroesophageal reflux disease)   . Iron deficiency anemia 03/21/2019  . Atrial fibrillation ([place]) 06/17/2017  . TIA (transient ischemic attack) 06/16/2017  . Diabetes mellitus without complication ([place])   . Hypertension   . Chronic kidney disease   . Abdominal pain, generalized 10/17/2014   Past Medical History:  Past Medical History:  Diagnosis Date  . Anemia   . Asthma    history of asthma  . Atrial fibrillation ([place])   . Chronic kidney disease   . Diabetes mellitus without complication ([place])    type 2 - no meds  . GERD (gastroesophageal reflux disease)   . HLD (hyperlipidemia)   . Hypertension   . Seasonal allergies   . TIA (transient ischemic attack) 06/15/2017   Past Surgical History:  Past Surgical History:  Procedure Laterality Date  . COLONOSCOPY WITH ESOPHAGOGASTRODUODENOSCOPY (EGD)  04/2019  . ESOPHAGOGASTRODUODENOSCOPY N/A 10/17/2014   Procedure: ESOPHAGOGASTRODUODENOSCOPY (EGD);  Surgeon: Wilford Corner, MD;  Location: [facility] ENDOSCOPY;  Service: Endoscopy;  Laterality: N/A;  . multiple tooth implants     pt thinks sedation   HPI:  Pt is an 79 y.o. male with PMH paroxysmal A Fib (on apixaban), TIA, T2DM, HTN, HLD, CKD, GERD, iron deficiency anemia who presented with altered mental status and blown pupils shortly after EGD concerning for acute CVA and acute hypoxemic respiratory failure likely secondary to pneumonia. MRI brain 10/18: Subtle 4 mm focus  of diffusion abnormality involving the ventral left medulla.    Assessment / Plan / Recommendation Clinical Impression  Pt participated in speech/language evaluation. He denied any baseline deficits in speech/language but stated that he has "slight" memory difficulty. Pt described his speech as "slurred" and indicated that it is 50% back to baseline. His language skills were WNL and his cognition was functional considering the level of support he reported having from his wife at home. He presented with mild dysarthria characterized by reduced articulatory precision and reduced vocal intensity which intermittently negatively impacted speech intelligibility at the conversational level. Skilled SLP services are recommended to target dysarthria.     SLP Assessment  SLP Recommendation/Assessment: Patient needs continued Speech Lanaguage Pathology Services SLP Visit Diagnosis: Dysarthria and anarthria (R47.1)    Follow Up Recommendations   (Continued SLP services at level of care recommened by PT/OT)    Frequency and Duration min 2x/week  2 weeks      SLP Evaluation Cognition  Overall Cognitive Status: History of cognitive impairments - at baseline Arousal/Alertness: Awake/alert Orientation Level: Oriented X4 Attention: Focused;Sustained Focused Attention: Appears intact Sustained Attention: Appears intact Memory: Impaired Memory Impairment: Retrieval deficit;Decreased recall of new information (Immediate: 3/3; delayed: 2/3; with cues: 1/1) Awareness: Appears intact Problem Solving: Appears intact Executive Function: Reasoning Reasoning: Impaired Reasoning Impairment: Verbal complex       Comprehension  Auditory Comprehension Overall Auditory Comprehension: Appears within functional limits for tasks assessed Yes/No Questions: Within Functional Limits Basic Biographical Questions:  (5/5) Complex Questions:  (5/5) Paragraph Comprehension (via yes/no questions):  (2/4) Commands:  Within Functional Limits  Two Step Basic Commands:  (4/4) Multistep Basic Commands:  (3/3) Interfering Components: Working Curator: Within Sports administrator Reading Comprehension Reading Status: Within funtional limits    Expression Expression Primary Mode of Expression: Verbal Verbal Expression Overall Verbal Expression: Appears within functional limits for tasks assessed Initiation: No impairment Automatic Speech: Counting;Day of week;Month of year (WNL) Level of Generative/Spontaneous Verbalization: Conversation Repetition: No impairment Naming: No impairment   Oral / Motor  Oral Motor/Sensory Function Overall Oral Motor/Sensory Function: Mild impairment Facial ROM: Reduced right;Suspected CN VII (facial) dysfunction Facial Symmetry: Within Functional Limits Facial Strength: Reduced right;Suspected CN VII (facial) dysfunction Facial Sensation: Within Functional Limits Lingual ROM: Within Functional Limits Lingual Symmetry: Within Functional Limits Lingual Strength: Reduced;Suspected CN XII (hypoglossal) dysfunction Motor Speech Overall Motor Speech: Impaired Respiration: Within functional limits Phonation: Low vocal intensity Resonance: Within functional limits Articulation: Impaired Level of Impairment: Conversation Intelligibility: Intelligibility reduced Word: 75-100% accurate Phrase: 75-100% accurate Sentence: 75-100% accurate Conversation: 75-100% accurate Motor Planning: Witnin functional limits Motor Speech Errors: Aware;Consistent   Todd [name] I. Hardin Negus, [place], [place] Office number [phone] Pager [place] 01/23/2020, 9:49 AM

## 2020-01-23 NOTE — Plan of Care (Signed)
  Problem: Education: Goal: Knowledge of General Education information will improve Description: Including pain rating scale, medication(s)/side effects and non-pharmacologic comfort measures Outcome: Progressing   Problem: Health Behavior/Discharge Planning: Goal: Ability to manage health-related needs will improve Outcome: Progressing   Problem: Clinical Measurements: Goal: Ability to maintain clinical measurements within normal limits will improve Outcome: Progressing Goal: Will remain free from infection Outcome: Progressing Goal: Diagnostic test results will improve Outcome: Progressing Goal: Respiratory complications will improve Outcome: Progressing Goal: Cardiovascular complication will be avoided Outcome: Progressing   Problem: Activity: Goal: Risk for activity intolerance will decrease Outcome: Progressing   Problem: Nutrition: Goal: Adequate nutrition will be maintained Outcome: Progressing   Problem: Coping: Goal: Level of anxiety will decrease Outcome: Progressing   Problem: Elimination: Goal: Will not experience complications related to bowel motility Outcome: Progressing Goal: Will not experience complications related to urinary retention Outcome: Progressing   Problem: Pain Managment: Goal: General experience of comfort will improve Outcome: Progressing   Problem: Safety: Goal: Ability to remain free from injury will improve Outcome: Progressing   Problem: Skin Integrity: Goal: Risk for impaired skin integrity will decrease Outcome: Progressing   Problem: Education: Goal: Knowledge of disease or condition will improve Outcome: Progressing Goal: Knowledge of secondary prevention will improve Outcome: Progressing Goal: Knowledge of patient specific risk factors addressed and post discharge goals established will improve Outcome: Progressing Goal: Individualized Educational Video(s) Outcome: Progressing   Problem: Coping: Goal: Will identify  appropriate support needs Outcome: Progressing   Problem: Self-Care: Goal: Ability to communicate needs accurately will improve Outcome: Progressing

## 2020-01-24 DIAGNOSIS — E0822 Diabetes mellitus due to underlying condition with diabetic chronic kidney disease: Secondary | ICD-10-CM | POA: Diagnosis not present

## 2020-01-24 DIAGNOSIS — N1831 Chronic kidney disease, stage 3a: Secondary | ICD-10-CM

## 2020-01-24 DIAGNOSIS — E1165 Type 2 diabetes mellitus with hyperglycemia: Secondary | ICD-10-CM

## 2020-01-24 DIAGNOSIS — M545 Low back pain, unspecified: Secondary | ICD-10-CM

## 2020-01-24 DIAGNOSIS — J690 Pneumonitis due to inhalation of food and vomit: Secondary | ICD-10-CM

## 2020-01-24 DIAGNOSIS — I6302 Cerebral infarction due to thrombosis of basilar artery: Secondary | ICD-10-CM

## 2020-01-24 DIAGNOSIS — D72829 Elevated white blood cell count, unspecified: Secondary | ICD-10-CM

## 2020-01-24 DIAGNOSIS — I4891 Unspecified atrial fibrillation: Secondary | ICD-10-CM | POA: Diagnosis not present

## 2020-01-24 DIAGNOSIS — G8929 Other chronic pain: Secondary | ICD-10-CM

## 2020-01-24 DIAGNOSIS — I480 Paroxysmal atrial fibrillation: Secondary | ICD-10-CM | POA: Diagnosis not present

## 2020-01-24 DIAGNOSIS — N183 Chronic kidney disease, stage 3 unspecified: Secondary | ICD-10-CM | POA: Diagnosis not present

## 2020-01-24 LAB — BASIC METABOLIC PANEL
Anion gap: 10 (ref 5–15)
BUN: 49 mg/dL — ABNORMAL HIGH (ref 8–23)
CO2: 20 mmol/L — ABNORMAL LOW (ref 22–32)
Calcium: 8.4 mg/dL — ABNORMAL LOW (ref 8.9–10.3)
Chloride: 111 mmol/L (ref 98–111)
Creatinine, Ser: 2.96 mg/dL — ABNORMAL HIGH (ref 0.61–1.24)
GFR, Estimated: 21 mL/min — ABNORMAL LOW (ref >60)
Glucose, Bld: 123 mg/dL — ABNORMAL HIGH (ref 70–99)
Potassium: 4.3 mmol/L (ref 3.5–5.1)
Sodium: 141 mmol/L (ref 135–145)

## 2020-01-24 LAB — GLUCOSE, CAPILLARY
Glucose-Capillary: 108 mg/dL — ABNORMAL HIGH (ref 70–99)
Glucose-Capillary: 104 mg/dL — ABNORMAL HIGH (ref 70–99)
Glucose-Capillary: 123 mg/dL — ABNORMAL HIGH (ref 70–99)
Glucose-Capillary: 147 mg/dL — ABNORMAL HIGH (ref 70–99)
Glucose-Capillary: 172 mg/dL — ABNORMAL HIGH (ref 70–99)

## 2020-01-24 LAB — CBC
HCT: 21.9 % — ABNORMAL LOW (ref 39.0–52.0)
HCT: 23.4 % — ABNORMAL LOW (ref 39.0–52.0)
Hemoglobin: 7 g/dL — ABNORMAL LOW (ref 13.0–17.0)
Hemoglobin: 7.4 g/dL — ABNORMAL LOW (ref 13.0–17.0)
MCH: 27.1 pg (ref 26.0–34.0)
MCH: 26.5 pg (ref 26.0–34.0)
MCHC: 32 g/dL (ref 30.0–36.0)
MCHC: 31.6 g/dL (ref 30.0–36.0)
MCV: 84.9 fL (ref 80.0–100.0)
MCV: 83.9 fL (ref 80.0–100.0)
Platelets: 143 10*3/uL — ABNORMAL LOW (ref 150–400)
Platelets: 161 10*3/uL (ref 150–400)
RBC: 2.58 MIL/uL — ABNORMAL LOW (ref 4.22–5.81)
RBC: 2.79 MIL/uL — ABNORMAL LOW (ref 4.22–5.81)
RDW: 14.2 % (ref 11.5–15.5)
RDW: 14 % (ref 11.5–15.5)
WBC: 11.2 10*3/uL — ABNORMAL HIGH (ref 4.0–10.5)
WBC: 11.4 10*3/uL — ABNORMAL HIGH (ref 4.0–10.5)
nRBC: 0 % (ref 0.0–0.2)
nRBC: 0 % (ref 0.0–0.2)

## 2020-01-24 LAB — HEMOGLOBIN A1C
Hgb A1c MFr Bld: 6.1 % — ABNORMAL HIGH (ref 4.8–5.6)
Mean Plasma Glucose: 128 mg/dL

## 2020-01-24 MED ORDER — SENNA 8.6 MG PO TABS
1.0000 | ORAL_TABLET | Freq: Every day | ORAL | Status: DC
  Administered 2020-01-24 – 2020-01-29 (×6): 8.6 mg via ORAL
  Filled 2020-01-24 (×6): qty 1

## 2020-01-24 MED ORDER — POLYETHYLENE GLYCOL 3350 17 G PO PACK
17.0000 g | PACK | Freq: Every day | ORAL | Status: DC
  Administered 2020-01-24 – 2020-01-28 (×5): 17 g via ORAL
  Filled 2020-01-24 (×6): qty 1

## 2020-01-24 NOTE — Plan of Care (Signed)
  Problem: Intracerebral Hemorrhage Tissue Perfusion: Goal: Complications of Intracerebral Hemorrhage will be minimized Outcome: Progressing   Problem: Ischemic Stroke/TIA Tissue Perfusion: Goal: Complications of ischemic stroke/TIA will be minimized Outcome: Progressing   

## 2020-01-24 NOTE — Progress Notes (Signed)
Inpatient Rehab Admissions:  Inpatient Rehab Consult received.  I met with patient at the bedside for rehabilitation assessment and to discuss goals and expectations of an inpatient rehab admission.  Note that he has VA benefits and [employer] Medicare benefits.  We discussed that [place] does not contract with [facility], and whether he would like to try and get insurance auth through [facility] for CIR.  He said he does not have a preference and asked that I call to discuss with his wife.  I've left her a voicemail but no return call as of this time.  Will continue to follow.    Signed: Shann Medal, PT, DPT Admissions Coordinator [phone] 01/24/20  3:37 PM

## 2020-01-24 NOTE — Progress Notes (Signed)
  Speech Language Pathology Treatment: Cognitive-Linquistic (Dysarthria)  Patient Details Name: Todd [name] MRN: [id number] DOB: 03-29-1941 Today's Date: 01/24/2020 Time: 8916-9450 SLP Time Calculation (min) (ACUTE ONLY): 18 min  Assessment / Plan / Recommendation Clinical Impression  Pt was seen for dysarthria treatment with his wife present and was cooperative during the session. Pt's wife is a retired Psychologist, educational and indicated having some knowledge of dysarthria. She was educated regarding the results of yesterday's evaluation and corroborated the pt's reports of his cognition being at baseline. Per the pt, his speech is currently 80% back to baseline. Both parties were educated regarding the nature of dysarthria, and compensatory strategies to improve speech intelligibility. Dysarthria handout was provided to reinforce education and both parties verbalized understanding regarding all areas of education. He used overarticulation at word level with 70% accuracy increasing to 100% with cues. At the sentence level he demonstrated 80% accuracy increasing to 100% accuracy with verbal prompts for overarticulation and vocal intensity. SLP will continue to follow pt.    HPI HPI: Pt is an 79 y.o. male with PMH paroxysmal A Fib (on apixaban), TIA, T2DM, HTN, HLD, CKD, GERD, iron deficiency anemia who presented with altered mental status and blown pupils shortly after EGD concerning for acute CVA and acute hypoxemic respiratory failure likely secondary to pneumonia. MRI brain 10/18: Subtle 4 mm focus of diffusion abnormality involving the ventral left medulla.       SLP Plan  Continue with current plan of care       Recommendations                   Follow up Recommendations: Inpatient Rehab SLP Visit Diagnosis: Dysarthria and anarthria (R47.1) Plan: Continue with current plan of care       [place] I. Hardin Negus, [place], [place] Office number  [phone] Pager [place] 01/24/2020, 4:08 PM

## 2020-01-24 NOTE — Evaluation (Signed)
Occupational Therapy Evaluation Patient Details Name: Todd [name] MRN: [id number] DOB: 04-30-1940 Today's Date: 01/24/2020    History of Present Illness 79 y.o. male presenting with altered mental status and blown pupils shortly after EGD concerning for acute CVA and acute hypoxemic respiratory failure likely secondary to pneumonia. PMH is significant for paroxysmal A Fib (on apixaban), TIA, T2DM, HTN, HLD, CKD, GERD, iron deficiency anemia. CT head without contrast without evidence of ICH. MRI of the brain did reveal potential subtle acute ischemic infarct in the ventral left medulla versus artifact.   Clinical Impression   Pt admitted with the above diagnoses and presents with below problem list. Pt will benefit from continued acute OT to address the below listed deficits and maximize independence with basic ADLs prior to d/c to venue below. PTA pt was independent with ADLs, endorses occasional use of cane. Pt presents with R side weakness and impaired coordination impacting ADLs. Pt is currently mod A with LB ADLs, min - mod A with functional transfers, min guard to min A with functional mobility utilizing rw. Tolerated session well.      Follow Up Recommendations  CIR    Equipment Recommendations  Other (comment) (defer to next venue)    Recommendations for Other Services       Precautions / Restrictions Precautions Precautions: Fall Restrictions Weight Bearing Restrictions: No      Mobility Bed Mobility Overal bed mobility: Needs Assistance Bed Mobility: Supine to Sit     Supine to sit: Supervision;HOB elevated     General bed mobility comments: extra time and effort. pt utilized bed rail and a bit of momentum    Transfers Overall transfer level: Needs assistance Equipment used: Rolling walker (2 wheeled) Transfers: Sit to/from Stand Sit to Stand: Mod assist;Min assist         General transfer comment: mod A on initial stand from EOB, min A to steady/control  descent into recliner. Cues for hand placement and technique with rw.    Balance Overall balance assessment: Needs assistance Sitting-balance support: No upper extremity supported;Feet supported Sitting balance-Leahy Scale: Good Sitting balance - Comments: supervision without UE support   Standing balance support: Single extremity supported;Bilateral upper extremity supported Standing balance-Leahy Scale: Poor Standing balance comment: reliant on UE support of RW and minG                           ADL either performed or assessed with clinical judgement   ADL Overall ADL's : Needs assistance/impaired Eating/Feeding: Set up;Sitting   Grooming: Minimal assistance;Sitting   Upper Body Bathing: Minimal assistance;Sitting   Lower Body Bathing: Moderate assistance;Sit to/from stand   Upper Body Dressing : Minimal assistance;Sitting   Lower Body Dressing: Moderate assistance;Sit to/from stand   Toilet Transfer: Minimal assistance;Ambulation;RW   Toileting- Clothing Manipulation and Hygiene: Moderate assistance;Sit to/from stand   Tub/ Shower Transfer: Tub transfer;Moderate assistance;Ambulation;Rolling walker;3 in 1   Functional mobility during ADLs: Minimal assistance;Rolling walker;Min guard General ADL Comments: Assist to powerup to standing and steady descent into sitting. Mostly min guard for functional mobility but light min A to steady on turns.      Vision   Additional Comments: Pt reports diplopia at times while watching tv. Not noticed during assessment. Further vision assessment may be needed.      Perception     Praxis      Pertinent Vitals/Pain Pain Assessment: No/denies pain     Hand Dominance Right  Extremity/Trunk Assessment Upper Extremity Assessment Upper Extremity Assessment: RUE deficits/detail;LUE deficits/detail RUE Deficits / Details: AROM shoulder level to 90*, gross strength 4/5 but UE muscle groups a bit weaker when tested in  isolation. Pt reports sensation intact. Decreased fine motor. Weak grip stength but able to hold onto rw. RUE Sensation: WNL RUE Coordination: decreased fine motor;decreased gross motor LUE Deficits / Details: generalized weakness.  LUE Sensation: WNL   Lower Extremity Assessment Lower Extremity Assessment: Defer to PT evaluation       Communication Communication Communication: No difficulties   Cognition Arousal/Alertness: Awake/alert Behavior During Therapy: Flat affect;WFL for tasks assessed/performed Overall Cognitive Status: No family/caregiver present to determine baseline cognitive functioning                                 General Comments: slow processing, delayed responses/decreased initiation at times. Flat affect.   General Comments       Exercises     Shoulder Instructions      Home Living Family/patient expects to be discharged to:: Private residence Living Arrangements: Spouse/significant other Available Help at Discharge: Family;Available 24 hours/day Type of Home: House Home Access: Stairs to enter [employer] of Steps: 2 Entrance Stairs-Rails: Can reach both Home Layout: Two level Alternate Level Stairs-Number of Steps: 13 Alternate Level Stairs-Rails: Right;Left (split rail, half left and half right) Bathroom Shower/Tub: Teacher, early years/pre: Standard     Home Equipment: Environmental consultant - 2 wheels;Cane - single point;Grab bars - toilet;Grab bars - tub/shower      Lives With: Spouse    Prior Functioning/Environment Level of Independence: Independent;Independent with assistive device(s)        Comments: pt reports use of cane intermittently when outdoors        OT Problem List: Decreased strength;Decreased activity tolerance;Impaired balance (sitting and/or standing);Decreased coordination;Decreased cognition;Decreased knowledge of use of DME or AE;Decreased knowledge of precautions;Impaired UE functional use       OT Treatment/Interventions: Self-care/ADL training;Therapeutic exercise;Neuromuscular education;DME and/or AE instruction;Therapeutic activities;Patient/family education;Balance training;Cognitive remediation/compensation    OT Goals(Current goals can be found in the care plan section) Acute Rehab OT Goals Patient Stated Goal: To return to independence OT Goal Formulation: With patient Time For Goal Achievement: 02/07/20 Potential to Achieve Goals: Fair ADL Goals Pt Will Perform Grooming: with set-up;sitting;with supervision;standing Pt Will Perform Lower Body Dressing: with modified independence;sit to/from stand Pt Will Transfer to Toilet: with modified independence;ambulating Pt Will Perform Toileting - Clothing Manipulation and hygiene: with modified independence;sit to/from stand Pt/caregiver will Perform Home Exercise Program: Increased strength;Right Upper extremity;With written HEP provided;With theraband;With theraputty  OT Frequency: Min 2X/week   Barriers to D/C:            Co-evaluation              AM-PAC OT "6 Clicks" Daily Activity     Outcome Measure Help from another person eating meals?: A Little Help from another person taking care of personal grooming?: A Little Help from another person toileting, which includes using toliet, bedpan, or urinal?: A Lot Help from another person bathing (including washing, rinsing, drying)?: A Lot Help from another person to put on and taking off regular upper body clothing?: A Little Help from another person to put on and taking off regular lower body clothing?: A Lot 6 Click Score: 15   End of Session Equipment Utilized During Treatment: Rolling walker;Gait belt  Activity Tolerance: Patient  tolerated treatment well Patient left: in chair;with call bell/phone within reach;with chair alarm set  OT Visit Diagnosis: Unsteadiness on feet (R26.81);Muscle weakness (generalized) (M62.81);Hemiplegia and hemiparesis;Other  symptoms and signs involving cognitive function Hemiplegia - Right/Left: Right                Time: 6116-4353 OT Time Calculation (min): 26 min Charges:  OT General Charges $OT Visit: 1 Visit OT Evaluation $OT Eval Moderate Complexity: 1 Mod OT Treatments $Self Care/Home Management : 8-22 mins  Tyrone Schimke, OT Acute Rehabilitation Services Pager: [phone] Office: [phone]   Hortencia Pilar 01/24/2020, 12:11 PM

## 2020-01-24 NOTE — Plan of Care (Signed)
  Problem: Education: Goal: Knowledge of General Education information will improve Description: Including pain rating scale, medication(s)/side effects and non-pharmacologic comfort measures Outcome: Progressing   Problem: Coping: Goal: Level of anxiety will decrease Outcome: Progressing   Problem: Safety: Goal: Ability to remain free from injury will improve Outcome: Progressing   Problem: Education: Goal: Knowledge of disease or condition will improve Outcome: Progressing Goal: Knowledge of secondary prevention will improve Outcome: Progressing Goal: Knowledge of patient specific risk factors addressed and post discharge goals established will improve Outcome: Progressing Goal: Individualized Educational Video(s) Outcome: Progressing

## 2020-01-24 NOTE — Progress Notes (Addendum)
Inpatient Rehab Admissions Coordinator:   Met with pt and his wife at bedside to discuss CIR recommendations and payor sources.  Pt and wife agreeable to pursuing auth with [facility] for [facility].  I will start this process today.   Pt's wife confirms that she can provide supervision, but not much physical assist at d/c from CIR.  We discussed ELOS of 9-12 days with supervision to mod I goals and she is agreeable to that. Will continue to follow for possible CIR admission, pending insurance approval, and bed availability.   Shann Medal, PT, DPT Admissions Coordinator [phone] 01/24/20  4:21 PM

## 2020-01-24 NOTE — PMR Pre-admission (Addendum)
PMR Admission Coordinator Pre-Admission Assessment  Patient: Todd [name] is an 79 y.o., male MRN: [id number] DOB: Jul 25, 1940 Height: 6' 1"  (185.4 cm) Weight: 104.2 kg              Insurance Information HMO:     PPO:      PCP:      IPA:      80/20:      OTHER:  PRIMARY: [employer]      Policy#: [id number]      Subscriber: pt CM Name: Lattie Haw      Phone#: [phone] ext [phone]     Fax#: [phone] Pre-Cert#: [id number] [place] for CIR provided by Lattie Haw with [facility] Medicare for admit 10/26 with updates due to Lestine Box ([phone]) at fax listed above on 11/2      Employer:  Benefits:  Phone #: [phone]     Name:  Eff. Date: 04/06/19     Deduct: $0      Out of Pocket Max: $4000 ($0 met)      Life Max: n/a  CIR: $160/day for days 1-10      SNF: 100% Outpatient:      Co-Pay: $20/visit Home Health: 100%      Co-Pay:  DME: 80%     Co-Pay: 20% Providers:  SECONDARY: [facility]      Policy#: [id number]  (through wife)    Phone#:   Development worker, community:       Phone#:   The Therapist, art Information Summary" for patients in Inpatient Rehabilitation Facilities with attached "Privacy Act [place] Records" was provided and verbally reviewed with: Patient and Family  Emergency Contact Information Contact Information    Name Relation Home Work Mobile   [place] Spouse [phone]     Current Medical History  Patient Admitting Diagnosis: L medullary infarct  History of Present Illness: 79 y.o. 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  After d/c to home he had issues with dizziness and falls due to hypotension and noted to have HR in 170-200 on evaluation by EMS.  History taken from chart review and patient.  He was cardioverted X 2 and found to have concerns of blown right pupil.  He was seen in ED and PERRL but had cogwheeling in R>L extremities and RLE  weakness.   CT head unremarkable for acute intracranial process.  Patient was not a candidate for tPA. MRI/MRA brain done?  Subtle diffusion abnormality involving ventral left medulla question subtle infarct.   CT chest showed diffuse infiltrate consistent with extensive left-sided pneumonia.  He was started on IV antibiotics.  Cardiology, Dr. Einar Gip felt that elevated cardiac enzymes related to CV/Afib. Metabolic acidosis treated with IV fluids and Coreg titrated upwards for rate control. Therapy evaluation revealed ataxic gait with slurred speech. CIR recommended due to functional decline.   Complete NIHSS TOTAL: 2 Glasgow Coma Scale Score: 15  Past Medical History  Past Medical History:  Diagnosis Date  . Anemia   . Asthma    history of asthma  . Atrial fibrillation ([place])   . Chronic kidney disease   . Diabetes mellitus without complication ([place])    type 2 - no meds  . GERD (gastroesophageal reflux disease)   . History of atrial fibrillation, S/P EP ablation   . HLD (hyperlipidemia)   . Hypertension   . Seasonal allergies   . TIA (transient ischemic attack) 06/15/2017  Family History  family history includes Colon cancer in his father; Diabetes in his father, mother, and sister; Stroke in his father.  Prior Rehab/Hospitalizations:  Has the patient had prior rehab or hospitalizations prior to admission? No  Has the patient had major surgery during 100 days prior to admission? Yes  Current Medications   Current Facility-Administered Medications:  .   stroke: mapping our early stages of recovery book, , Does not apply, Once, Lattie Haw, MD .  acetaminophen (TYLENOL) tablet 650 mg, 650 mg, Oral, Q6H PRN, Zola Button, MD, 650 mg at 01/25/20 1650 .  albuterol (VENTOLIN HFA) 108 (90 Base) MCG/ACT inhaler 2 puff, 2 puff, Inhalation, Q6H PRN, Posey Pronto, Poonam, MD .  amLODipine (NORVASC) tablet 10 mg, 10 mg, Oral, Daily, Ganta, Anupa, DO, 10 mg at 01/29/20 0902 .   amoxicillin-clavulanate (AUGMENTIN) 500-125 MG per tablet 500 mg, 1 tablet, Oral, Q12H, Hensel, Jamal Collin, MD, 500 mg at 01/29/20 0901 .  apixaban (ELIQUIS) tablet 2.5 mg, 2.5 mg, Oral, BID, Dickie La, MD, 2.5 mg at 01/29/20 0902 .  atorvastatin (LIPITOR) tablet 80 mg, 80 mg, Oral, q1800, Lattie Haw, MD, 80 mg at 01/28/20 1821 .  carvedilol (COREG) tablet 12.5 mg, 12.5 mg, Oral, BID WC, Ganta, Anupa, DO, 12.5 mg at 01/29/20 0902 .  doxazosin (CARDURA) tablet 4 mg, 4 mg, Oral, Q12H, Gifford Shave, MD, 4 mg at 01/29/20 0901 .  ferrous sulfate tablet 325 mg, 325 mg, Oral, Daily, Lattie Haw, MD, 325 mg at 01/29/20 0902 .  finasteride (PROSCAR) tablet 5 mg, 5 mg, Oral, Daily, Gifford Shave, MD, 5 mg at 01/29/20 0902 .  fluticasone (FLONASE) 50 MCG/ACT nasal spray 2 spray, 2 spray, Each Nare, Daily PRN, Lattie Haw, MD .  gabapentin (NEURONTIN) capsule 100 mg, 100 mg, Oral, Daily PRN, Gifford Shave, MD, 100 mg at 01/29/20 0902 .  hydrALAZINE (APRESOLINE) tablet 75 mg, 75 mg, Oral, Q8H, Pashayan, Redgie Grayer, MD, 75 mg at 01/29/20 1409 .  pantoprazole (PROTONIX) EC tablet 40 mg, 40 mg, Oral, Daily, Lattie Haw, MD, 40 mg at 01/29/20 0902 .  polyethylene glycol (MIRALAX / GLYCOLAX) packet 17 g, 17 g, Oral, Daily, Benay Pike, MD, 17 g at 01/28/20 1026 .  senna (SENOKOT) tablet 8.6 mg, 1 tablet, Oral, Daily, Benay Pike, MD, 8.6 mg at 01/29/20 0901  Patients Current Diet:  Diet Order            Diet Heart Room service appropriate? Yes; Fluid consistency: Thin  Diet effective now                 Precautions / Restrictions Precautions Precautions: Fall Restrictions Weight Bearing Restrictions: No   Has the patient had 2 or more falls or a fall with injury in the past year?No  Prior Activity Level Limited Community (1-2x/wk): independent prior to admission, no DME except occasional cane outside, not driving  Prior Functional Level Prior Function Level of  Independence: Independent, Independent with assistive device(s) Comments: pt reports use of cane intermittently when outdoors  Self Care: Did the patient need help bathing, dressing, using the toilet or eating?  Independent  Indoor Mobility: Did the patient need assistance with walking from room to room (with or without device)? Independent  Stairs: Did the patient need assistance with internal or external stairs (with or without device)? Independent  Functional Cognition: Did the patient need help planning regular tasks such as shopping or remembering to take medications? [place] / Equipment  Home Equipment: Gilford Rile - 2 wheels, Cane - single point, Grab bars - toilet, Grab bars - tub/shower  Prior Device Use: Indicate devices/aids used by the patient prior to current illness, exacerbation or injury? None of the above  Current Functional Level Cognition  Arousal/Alertness: Awake/alert Overall Cognitive Status: Within Functional Limits for tasks assessed Orientation Level: Oriented X4 General Comments: pt disoriented to day of week, date and year but able to state location and month. pt fixated on getting to see wife. collaborated with PT to transport pt to see wife. Attention: Focused, Sustained Focused Attention: Appears intact Sustained Attention: Appears intact Memory: Impaired Memory Impairment: Retrieval deficit, Decreased recall of new information (Immediate: 3/3; delayed: 2/3; with cues: 1/1) Awareness: Appears intact Problem Solving: Appears intact Executive Function: Reasoning Reasoning: Impaired Reasoning Impairment: Verbal complex    Extremity Assessment (includes Sensation/Coordination)  Upper Extremity Assessment: RUE deficits/detail, LUE deficits/detail RUE Deficits / Details: AROM shoulder level to 90*, gross strength 4/5 but UE muscle groups a bit weaker when tested in isolation. Pt reports sensation intact. Decreased fine motor. Weak  grip stength but able to hold onto rw. RUE Sensation: WNL RUE Coordination: decreased fine motor, decreased gross motor LUE Deficits / Details: generalized weakness.  LUE Sensation: WNL  Lower Extremity Assessment: Defer to PT evaluation    ADLs  Overall ADL's : Needs assistance/impaired Eating/Feeding: Set up, Sitting Grooming: Minimal assistance, Sitting Upper Body Bathing: Minimal assistance, Sitting Lower Body Bathing: Moderate assistance, Sit to/from stand Upper Body Dressing : Minimal assistance, Sitting Upper Body Dressing Details (indicate cue type and reason): to don gown as back side cover Lower Body Dressing: Min guard Lower Body Dressing Details (indicate cue type and reason): to adjust socks from EOB with minguard when reaching out of BOS Toilet Transfer: Minimal assistance, RW, Stand-pivot, Cueing for sequencing, Cueing for safety, Maximal assistance, Moderate assistance Toilet Transfer Details (indicate cue type and reason): MAX A +1 to intially power up into standing from EOB, MOD A +1 for stand pivot transfer to recliner, cues for sequencing of task and body mechanics in relation to RW mgmt Toileting- Clothing Manipulation and Hygiene: Moderate assistance, Sit to/from stand Tub/ Shower Transfer: Tub transfer, Moderate assistance, Ambulation, Rolling walker, 3 in 1 Functional mobility during ADLs: Minimal assistance, Moderate assistance, Rolling walker, Cueing for safety, Cueing for sequencing General ADL Comments: pt continues to present with generalized weakness RUE>LUE, decreased activity tolerance and impaired balance impacting pts ability to complete BADLs    Mobility  Overal bed mobility: Needs Assistance Bed Mobility: Supine to Sit Supine to sit: Mod assist, HOB elevated General bed mobility comments: up in chair with OT upon arrival to room    Transfers  Overall transfer level: Needs assistance Equipment used: Rolling walker (2 wheeled) Transfers: Sit to/from  Stand, [employer] Transfers Sit to Stand: Mod assist Stand pivot transfers: Mod assist General transfer comment: Mod assist for power up, hip extension with posterior trunk tactile facilitation, and steadying upon standing. sit<>stand x4 throughout session, with stand pivot to and from transport chair each time.    Ambulation / Gait / Stairs / Wheelchair Mobility  Ambulation/Gait Ambulation/Gait assistance: Herbalist (Feet): 90 Feet (+70) Assistive device: Rolling walker (2 wheeled) Gait Pattern/deviations: Step-through pattern, Decreased stride length, Shuffle, Drifts right/left General Gait Details: Min assist to steady, avoid obstacles to pt L. Poor attention to L and almost running into NP in hallway. PT asks "did you see her", pt states "no I didn't". Gait velocity:  decr Gait velocity interpretation: <1.8 ft/sec, indicate of risk for recurrent falls    Posture / Balance Dynamic Sitting Balance Sitting balance - Comments: supervision Balance Overall balance assessment: Needs assistance Sitting-balance support: No upper extremity supported, Feet supported Sitting balance-Leahy Scale: Fair Sitting balance - Comments: supervision Standing balance support: Bilateral upper extremity supported, During functional activity Standing balance-Leahy Scale: Poor Standing balance comment: reliant on UE support of RW and minG    Special needs/care consideration Diabetic management yes and Designated visitor [place] (from acute therapy documentation) Living Arrangements: Spouse/significant other  Lives With: Spouse Available Help at Discharge: Family, Available 24 hours/day Type of Home: House Home Layout: Two level Alternate Level Stairs-Rails: Right, Left (split rail, half left and half right) Alternate Level Stairs-Number of Steps: 13 Home Access: Stairs to enter Entrance Stairs-Rails: Can reach both Entrance Stairs-Number of  Steps: 2 Bathroom Shower/Tub: Chiropodist: Standard  Discharge Living Setting Plans for Discharge Living Setting: Patient's home Type of Home at Discharge: House Discharge Home Layout: Two level, Bed/bath upstairs Alternate Level Stairs-Rails: Left (1/2 rail on R) Alternate Level Stairs-Number of Steps: 13 Discharge Home Access: Stairs to enter Entrance Stairs-Rails: Left, Right Entrance Stairs-Number of Steps: 2 Discharge Bathroom Shower/Tub: Tub/shower unit Discharge Bathroom Toilet: Standard Discharge Bathroom Accessibility: Yes How Accessible: Accessible via walker Does the patient have any problems obtaining your medications?: No  Social/Family/Support Systems Patient Roles: Spouse Anticipated Caregiver: Treyon Wymore (spouse) Anticipated Caregiver's Contact Information: [phone]; [phone] Ability/Limitations of Caregiver: supervision only Caregiver Availability: 24/7 Discharge Plan Discussed with Primary Caregiver: Yes Is Caregiver In Agreement with Plan?: Yes Does Caregiver/Family have Issues with Lodging/Transportation while Pt is in Rehab?: No   Goals Patient/Family Goal for Rehab: PT/OT supervision to mod I, SLP n/a Expected length of stay: 9-12 days Additional Information: pt also has VA benefits Pt/Family Agrees to Admission and willing to participate: Yes Program Orientation Provided & Reviewed with Pt/Caregiver Including Roles  & Responsibilities: Yes Additional Information Needs: no   Decrease burden of Care through IP rehab admission: n/a   Possible need for SNF placement upon discharge:Not anticipated   Patient Condition: I have reviewed medical records from [facility], spoken with CM, and patient and spouse. I met with patient at the bedside for inpatient rehabilitation assessment.  Patient will benefit from ongoing PT, OT and SLP, can actively participate in 3 hours of therapy a day 5 days of the week, and can  make measurable gains during the admission.  Patient will also benefit from the coordinated team approach during an Inpatient Acute Rehabilitation admission.  The patient will receive intensive therapy as well as Rehabilitation physician, nursing, social worker, and care management interventions.  Due to bladder management, bowel management, safety, skin/wound care, disease management, medication administration, pain management and patient education the patient requires 24 hour a day rehabilitation nursing.  The patient is currently min to mod assist with mobility and basic ADLs.  Discharge setting and therapy post discharge at home with home health is anticipated.  Patient has agreed to participate in the Acute Inpatient Rehabilitation Program and will admit today.  Preadmission Screen Completed By:  Michel Santee, PT, DPT 01/29/2020 3:28 PM ______________________________________________________________________   Discussed status with Dr. Naaman Plummer on 01/29/20 at 3:28 PM and received approval for admission today.  Admission Coordinator:  Michel Santee, PT, DPT time 3:28 PM Sudie Grumbling 01/29/20

## 2020-01-24 NOTE — Progress Notes (Signed)
Physical Therapy Treatment Patient Details Name: Todd [name] MRN: [id number] DOB: [birth year] Today's Date: 01/24/2020    History of Present Illness 79 y.o. male presenting with altered mental status and blown pupils shortly after EGD concerning for acute CVA and acute hypoxemic respiratory failure likely secondary to pneumonia. PMH is significant for paroxysmal A Fib (on apixaban), TIA, T2DM, HTN, HLD, CKD, GERD, iron deficiency anemia. CT head without contrast without evidence of ICH. MRI of the brain did reveal potential subtle acute ischemic infarct in the ventral left medulla versus artifact.    PT Comments    Pt tolerates treatment well with improved ambulation tolerance and quality. Pt continues to demonstrate LE strength deficits resulting in assistance requirements during transfers. Pt also demonstrates continued impairment in R hand control, losing grip on RW multiple times during session. PT continues to recommend CIR at this time as the pt was independent prior to admission and demonstrates the potential to return to a supervision level of mobility.   Follow Up Recommendations  CIR     Equipment Recommendations  None recommended by PT    Recommendations for Other Services       Precautions / Restrictions Precautions Precautions: Fall Restrictions Weight Bearing Restrictions: No    Mobility  Bed Mobility Overal bed mobility: Needs Assistance Bed Mobility: Supine to Sit     Supine to sit: Supervision;HOB elevated     General bed mobility comments: extra time and effort. pt utilized bed rail and a bit of momentum  Transfers Overall transfer level: Needs assistance Equipment used: Rolling walker (2 wheeled) Transfers: Sit to/from Stand Sit to Stand: Mod assist         General transfer comment: PT providing cues for hand and foot placement as well as technique. Pt continues to require significant assistance to power up to standing due to LE  weakness  Ambulation/Gait Ambulation/Gait assistance: Min guard Gait Distance (Feet): 80 Feet Assistive device: Rolling walker (2 wheeled) Gait Pattern/deviations: Step-to pattern Gait velocity: reduced Gait velocity interpretation: <1.8 ft/sec, indicate of risk for recurrent falls General Gait Details: pt with short step to gait, improved control of stance width without instances of scissoring. Pt does have poor R hand control and requires cues to maintain R hand on RW   Stairs             Wheelchair Mobility    Modified Rankin (Stroke Patients Only) Modified Rankin (Stroke Patients Only) Pre-Morbid Rankin Score: No significant disability Modified Rankin: Moderately severe disability     Balance Overall balance assessment: Needs assistance Sitting-balance support: No upper extremity supported;Feet supported Sitting balance-Leahy Scale: Good Sitting balance - Comments: supervision   Standing balance support: Single extremity supported;Bilateral upper extremity supported Standing balance-Leahy Scale: Poor Standing balance comment: reliant on UE support of RW and minG                            Cognition Arousal/Alertness: Awake/alert Behavior During Therapy: WFL for tasks assessed/performed Overall Cognitive Status: No family/caregiver present to determine baseline cognitive functioning                                 General Comments: slow processing and delayed response time      Exercises      General Comments General comments (skin integrity, edema, etc.): VSS on RA      Pertinent Vitals/Pain Pain Assessment:  No/denies pain    Home Living Family/patient expects to be discharged to:: Private residence Living Arrangements: Spouse/significant other Available Help at Discharge: Family;Available 24 hours/day Type of Home: House Home Access: Stairs to enter Entrance Stairs-Rails: Can reach both Home Layout: Two level Home  Equipment: Walker - 2 wheels;Cane - single point;Grab bars - toilet;Grab bars - tub/shower      Prior Function Level of Independence: Independent;Independent with assistive device(s)      Comments: pt reports use of cane intermittently when outdoors   PT Goals (current goals can now be found in the care plan section) Acute Rehab PT Goals Patient Stated Goal: To return to independence Progress towards PT goals: Progressing toward goals    Frequency    Min 4X/week      PT Plan Current plan remains appropriate    Co-evaluation              AM-PAC PT "6 Clicks" Mobility   Outcome Measure  Help needed turning from your back to your side while in a flat bed without using bedrails?: None Help needed moving from lying on your back to sitting on the side of a flat bed without using bedrails?: A Little Help needed moving to and from a bed to a chair (including a wheelchair)?: A Little Help needed standing up from a chair using your arms (e.g., wheelchair or bedside chair)?: A Lot Help needed to walk in hospital room?: A Little Help needed climbing 3-5 steps with a railing? : Total 6 Click Score: 16    End of Session   Activity Tolerance: Patient tolerated treatment well Patient left: in chair;with call bell/phone within reach;with chair alarm set Nurse Communication: Mobility status PT Visit Diagnosis: Other abnormalities of gait and mobility (R26.89);Muscle weakness (generalized) (M62.81);Other symptoms and signs involving the nervous system (O87.579)     Time: 7282-0601 PT Time Calculation (min) (ACUTE ONLY): 19 min  Charges:  $Gait Training: 8-22 mins                     Zenaida Niece, PT, DPT Acute Rehabilitation Pager: [phone]    Zenaida Niece 01/24/2020, 12:59 PM

## 2020-01-24 NOTE — Discharge Summary (Addendum)
[facility] Discharge Summary  Patient name: Todd [name] Medical record number: [id number] Date of birth: June 28, 1940 Age: 79 y.o. 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  Constitutional:      General: He is not in acute distress.    Appearance: Normal appearance. He is obese. He is not ill-appearing, toxic-appearing or diaphoretic.  HENT:     Head: Normocephalic and atraumatic.  Cardiovascular:     Rate and Rhythm: Normal rate and regular rhythm.     Pulses:          Radial pulses are 2+ on the right side and 2+ on the left side.       Dorsalis pedis pulses are 2+ on the right side and 2+ on the left side.     Heart sounds: Normal heart sounds, S1 normal and S2 normal. No murmur heard.   Pulmonary:     Effort: Pulmonary effort is normal. No respiratory distress.     Breath sounds: Normal breath sounds. No wheezing.  Abdominal:     General: There is no distension.     Palpations: There is no mass.     Tenderness: There is no abdominal tenderness.  Musculoskeletal:     Right lower leg: No edema.     Left lower leg: No edema.  Neurological:     Mental Status: He is alert.      Brief Hospital Course:  Acute hypoxemic respiratory failure secondary to aspiration pneumonia: Was admitted after being found down at home approximately one hour after returning home from EGD. Initially had agonal breathing and had a  sat of 84%, was placed on 15 L/min by ED. By the time patient was admitted he was on room air again while satting greater than 96%. Initially had concern for sepsis. However only meet 2 of the SIRS criteria, he did ave a WBC of 21 and was tachycardiac. However, he did go into A fib requiring to be cardioverted twice by EMS and additional 10 mg IV Diltiazem and so tachycardia was thought to be cardiac in nature not due to infection. He was afebrile, did not have tachypnea, and did not have Lactic acid >4. He was started on Unasyn and after 2 days was transitioned to oral Augmentin 500-125 mg BID for 8 days for a total antibiotic treatment of 10 days. He stayed on room air and denied chest pain and SOB his entire hospitalization.   Left MedullaryCVA: When examined by EMS was documented to have a blown pupil. While in the ED pupils were found to be equal and reactive to direct and consensual light. CT Head WO Contrast did not reveal an ICH, however, due to concern for bleeding and worsening his hemodynamic stability he was not given tPA. Due to his low NIHSS score (2), pt was not a candidate for thrombectomy.    AFib with RVR: When EMS arrived at patients house he was found to have a HR up to 200. He received 2 cardioversion shocks by EMS  and arrived to the ED with a HR in the 130's. Was given IV diltiazem and was successfully rate controlled. He remained in rate control during his entire admission.  Home medications were continued for other chronic diseases which remained stable.    Issues for Follow Up:  1. Acute hypoxemic respiratory failure secondary to aspiration pneumonia: Ensure no worsening of respiratory status and resolution of infection and completion of antibiotic course.   2. Left MedullaryCVA: Assess for sequela and that follow up has scheduled neurology follow up.   3. AFib with RVR: Assess for continued rate control. Ensure a follow up appointment is made with  cardiology.   Significant Procedures: None  Significant Labs and Imaging:  Recent Labs  Lab 01/27/20 0152 01/28/20 0346 01/29/20 0931  WBC 8.4 6.7 7.4  HGB 8.0* 7.7* 7.9*  HCT 25.4* 24.8* 25.2*  PLT 193 195 211   Recent Labs  Lab 01/25/20 0122 01/25/20 0122 01/26/20 0200 01/26/20 0200 01/27/20 0152 01/27/20 0152 01/28/20 0346 01/29/20 0931  NA 140  --  142  --  142  --  143 140  K 4.1   < > 4.3   < > 4.1   < > 4.4 4.3  CL 110  --  112*  --  110  --  112* 109  CO2 21*  --  22  --  24  --  23 22  GLUCOSE 140*  --  138*  --  134*  --  120* 190*  BUN 54*  --  45*  --  42*  --  45* 48*  CREATININE 3.06*  --  2.57*  --  2.54*  --  2.51* 2.64*  CALCIUM 8.3*  --  8.6*  --  8.3*  --  8.3* 8.3*   < > = values in this interval not displayed.   A1C: 6.1   Echo: EF 55-60%. No wall motion abnormalities and Grade III diastolic dysfunction. Left atrium severley dilated. Right atrium mildly dilated. All other structures examined grossly normal.   VAS US Carotid: Right Carotid: Velocities in the right ICA are consistent with a 1-39%  stenosis.  Left Carotid: Velocities in the left ICA are consistent with a 1-39%  stenosis.  Vertebrals: Bilateral vertebral arteries demonstrate antegrade flow.   MR Angio Head WO Contrast:Mildly motion degraded exam. Negative intracranial MRA for large vessel occlusion. No hemodynamically significant or correctable stenosis.   MR Brain WO Contrast:Mildly motion degraded exam. Negative intracranial MRA for large vessel occlusion. No hemodynamically significant or correctable stenosis.   CT Chest WO Contrast:Diffuse left-sided pneumonia. Cholelithiasis without complicating factors.   DG Chest Portable 1 View:Marked severity left-sided infiltrate.   CT Head Code Stroke WO Contrast:No acute intracranial abnormality. ASPECTS is 10.Motion degraded study.   Results/Tests Pending at Time of Discharge: None  Discharge Medications:   Allergies as of 01/29/2020      Reactions   Lisinopril Cough   Metformin And Related Other (See Comments)   chronic kidney disease      Medication List    STOP taking these medications   NIFEdipine 60 MG 24 hr tablet Commonly known as: ADALAT CC     TAKE these medications   albuterol 108 (90 Base) MCG/ACT inhaler Commonly known as: VENTOLIN HFA Inhale 2 puffs into the lungs every 6 (six) hours as needed for wheezing or shortness of breath.   amLODipine 10 MG tablet Commonly known as: NORVASC Take 10 mg by mouth daily.   amoxicillin-clavulanate 500-125 MG  tablet Commonly known as: AUGMENTIN Take 1 tablet (500 mg total) by mouth every 12 (twelve) hours.   apixaban 2.5 MG Tabs tablet Commonly known as: ELIQUIS Take 1 tablet (2.5 mg total) by mouth 2 (two) times daily. What changed:   medication strength  how much to take   atorvastatin 80 MG tablet Commonly known as: LIPITOR Take 1 tablet (80 mg total) by mouth daily at 6 PM.   calcitRIOL 0.25 MCG capsule Commonly known as: ROCALTROL Take 0.25 mcg by mouth daily.   carvedilol 12.5 MG tablet Commonly known as: COREG Take 1 tablet (12.5 mg total) by mouth 2 (two) times daily with a meal.   cholecalciferol 25 MCG (1000 UNIT) tablet Commonly known as: VITAMIN D Take 1,000 Units by mouth daily.   docusate sodium 50 MG capsule Commonly known as: COLACE Take 100 mg by mouth daily as needed for mild constipation.   doxazosin 4 MG tablet Commonly known as: CARDURA Take 1 tablet (4 mg total) by mouth every 12 (twelve) hours. What changed:   medication strength  how much to take  when to take this  additional instructions   ferrous sulfate 325 (65 FE) MG tablet Take 325 mg by mouth daily.   finasteride 5 MG tablet Commonly known as: PROSCAR Take 5 mg by mouth daily.   fluticasone 50 MCG/ACT nasal spray Commonly known as: FLONASE Place 2 sprays into both nostrils daily as needed for allergies or  rhinitis.   furosemide 40 MG tablet Commonly known as: LASIX Take 40 mg by mouth See admin instructions. Take 40 mg by mouth daily in the morning  Take additional 40 mg by mouth at bedtime on Mon., Wed., and friday   hydrALAZINE 25 MG tablet Commonly known as: APRESOLINE Take 3 tablets (75 mg total) by mouth every 8 (eight) hours. What changed:   medication strength  how much to take  when to take this   lactulose 10 GM/15ML solution Commonly known as: CHRONULAC Take 10 g by mouth daily as needed for mild constipation or moderate constipation.   loratadine 10 MG tablet Commonly known as: CLARITIN Take 10 mg by mouth at bedtime.   losartan 100 MG tablet Commonly known as: COZAAR Take 100 mg by mouth daily.   omeprazole 40 MG capsule Commonly known as: PRILOSEC Take 1 capsule (40 mg total) by mouth 2 (two) times daily before a meal.   polycarbophil 625 MG tablet Commonly known as: FIBERCON Take 625 mg by mouth daily.   RA SALINE ENEMA RE Place 1 application rectally as needed (take every other day as needed for constipation).   sennosides-docusate sodium 8.6-50 MG tablet Commonly known as: SENOKOT-S Take 1 tablet by mouth 2 (two) times daily as needed for constipation.   sucralfate 1 GM/10ML suspension Commonly known as: Carafate Take 10 mLs (1 g total) by mouth 2 (two) times daily. Take once during the day and before bedtime.  Try to no take any other medications 1 hour before or after use of Carafate.   vitamin B-12 500 MCG tablet Commonly known as: CYANOCOBALAMIN Take 1,000 mcg by mouth daily.       Discharge Instructions: Please refer to Patient Instructions section of EMR for full details.  Patient was counseled important signs and symptoms that should prompt return to medical care, changes in medications, dietary instructions, activity restrictions, and follow up appointments.   Follow-Up Appointments:  Follow-up Information    [facility]. Schedule an appointment as soon  as possible for a visit in 4 week(s).   Specialty: Neurology Contact information: [address] [place] [phone]              Briant Cedar, MD 01/29/2020, 3:43 PM PGY-1, [place], DO PGY-2, [facility] 01/31/2020 2:11 PM

## 2020-01-24 NOTE — Progress Notes (Addendum)
Family Medicine Teaching Service Daily Progress Note Intern Pager: [phone]  Patient name: Todd [name] Medical record number: [id number] Date of birth: March 16, 1941 Age: 79 y.o. Gender: male  Primary Care Provider: Charlsie Merles, MD Consultants: Neurology, Cardiology Code Status: Full  Pt Overview and Major Events to Date:  10/18 Admitted  Assessment and Plan:  Andoni Busch a 79 y.o.malepresenting with altered mental status and blown pupils shortly after EGD concerning for acute CVA and acute hypoxemic respiratory failure likely secondary to pneumonia. PMH is significant forparoxysmal A Fib (on apixaban), TIA, T2DM, HTN, HLD, CKD, GERD, iron deficiency anemia.   Acute hypoxemic respiratory failure possible aspiration pneumonia  Sepsis ruled out Patient underwent EGD10/18 later was weak andfoundunresponsivewith agonal breathing.CXRshowedmarked left-sided infiltrate concerning for pneumonia. CT chest was also obtained with findings consistent with diffuse left-sided pneumonia. Must considerPE causing acute hypoxia and hemodynamic instability as pt has been off anticoagulation for >3 days. Wells score for PE 4.5 (moderate risk). Will transition to oral antibiotics for a total of 10 days of treatment. Continues to not have chest pain, SOB and be on room air. While the patient had infection, he was afebrile, was not tachypnic, nor was he hypertensive. He was originally tachycardic but this was due to his A fib which after cardioversion by EMS and then diltiazem was brought under rate control. He did have a white count of 15.6. -Will stopUnasyn1.5 g q12h (Day 2) -Continue Augmentin 500-125 mg BID (Day 1 of 8) -Continue cardiac monitoringand continuous pulse oximetry -Am CBC, CMP -Blood cultures drawn 10/18 no growth at 3 days -Monitor fever curvewill obtainnew blood cultures if Fever appears -Consider CTA to rule out PEif symptoms worsen   Concern for left  medullaryCVA As above, patient was found to be unresponsive with blownrightpupil about 2 hours after EGD procedure, code stroke was called upon arrival to the ED.Blown pupil was not noted in the ED.Neurology was consulted. CT head without contrast without evidence of ICH. MRI of the brain did reveal potential subtle acute ischemic infarct in the ventral left medulla versus artifact. Neurology will continue to follow before making a diagnosis of CVA. Of note,patient's anticoagulation was held for a few days for procedure today (wife states his last dose of apixaban was this past Friday10/15).Has history of TIA 06/2017.Stroke swallow screen passedin the ED.Carotid US shows no blockages. Today Right leg is able to be held up against light resistance. -Appreciate Neurologyrecommendations -Monitor limb weakness with serial exams -Neurologyrecommends normotensive BP goal -Hold all home anti-hypertensives -Lipid panel   AFib with RVR A fib in RVRto 130in the ED, rate controlled s/p10 mg IVdiltiazem.Tachycardic up to 200 prior to arrival s/p cardioversions x2 without significant change. Restarted Carvedilol and Eliquis yesterday and will continue to monitor. -Appreciate Cardiology recommendations -Continue 2.5BIDgiven pt has been off anticoagulation for >3 days and risk of stroke outweighs risk of acute hemorrhage -Continue Carvedilol to 12.5 mg BID -Cardiac monitoring   Anion gap metabolic acidosis On admission elevated lactate 5.5.with anion gap metabolic acidosis (bicarb 16, AG 17). This morning anion gap is 10. -Daily BMP   Hypokalemia K 3.3on admissionand repleted withKCl 40 mEq. K this morning is 3.9. -Monitor with daily BMP -Replete as necessary   HTN  Home meds:amlodipine 10 mg, doxazosin (8 mg AM, 4 mg bedtime), hydralazine 100 mg TID, losartan 100 mg. Have clarified with patient that he once took Nifedipine but stopped, he is taking all others. BP have  continued to remain normotensive BP today is 143/67. 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 BNP 185.1.Home medication is Furosemide 40 mg MWF. S/p IV furosemide 40 mg in the ED. Echo shows improved EF of 55-60%. -Holding furosemide (permissive HTN) -strict I/O -Daily weights   HLD Home meds: atorvastatin 80 mg. -Atorvastatin 80mg  once daily -Follow up lipid panel   Asthma Home meds: albuterol prn -Continue albuterol prn   Diabetes Last A1c7.6 in 2019.Not on any antihyperglycemic agents. A1C is 6.1. CBG have been decently controlled. -CBGs Q4H -Start sSSI if needed   AKI onCKD Cr3.30on admission.Baseline appears to be around 2.6-2.7. Follows with nephrology at the [facility] in [place].Creatinine today is 2.96. 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 Home meds: ferrous sulfate 325 mg daily, vitamin B12 1000 mcg daily. Hgb today is 7.0. Continue to monitor may need to transfuse. -ContinueFerrousSulphate 325mg  daily -Clarify transfusion threshold -Monitor Hgb q12 to ensure stability   GERD Home meds: omeprazole 40 mg BID, sucralfate 1 g BID. -Continuepantoprazole40mg per formulary   BPH Home meds: doxazosin (8 mg AM, 4 mg PM), finasteride 5 mg. -Holdingboth doxazosin and finasteride(can cause hypotension)   FEN/GI: Heart Healthy PPx: Lovenox  Disposition: Med tele  Prior to Admission Living Arrangement: Home Anticipated Discharge Location: CIR Barriers to Discharge: CIR placement Anticipated discharge in approximately 0-1 day(s).   Subjective:  Interviewed patient at bedside.  He reports feeling well today. States he has no chest pain or SOB. Reports his breathing is  fine.  Objective: Temp:  [97.9 F (36.6 C)-99.2 F (37.3 C)] 99.2 F (37.3 C) (10/20 2358) Pulse Rate:  [64-98] 64 (10/20 2358) Resp:  [16-20] 17 (10/20 2358) BP: (147-171)/(68-93) 149/68 (10/20 2358) SpO2:  [96 %-100 %] 98 % (10/20 2358) Physical Exam:  Physical Exam Vitals and nursing note reviewed.  Constitutional:      General: He is not in acute distress.    Appearance: He is not ill-appearing, toxic-appearing or diaphoretic.  HENT:     Head: Normocephalic and atraumatic.  Cardiovascular:     Rate and Rhythm: Normal rate and regular rhythm.     Pulses: Normal pulses.          Radial pulses are 2+ on the right side and 2+ on the left side.       Dorsalis pedis pulses are 2+ on the right side and 2+ on the left side.     Heart sounds: Normal heart sounds, S1 normal and S2 normal.  Pulmonary:     Effort: Pulmonary effort is normal. No respiratory distress.     Breath sounds: Normal breath sounds. No wheezing.  Abdominal:     General: There is no distension.     Palpations: There is no mass.     Tenderness: There is no abdominal tenderness.  Musculoskeletal:     Right lower leg: No edema.     Left lower leg: No edema.  Neurological:     Mental Status: He is alert.      Laboratory: Recent Labs  Lab 01/23/20 0452 01/23/20 1631 01/24/20 0536  WBC 14.1* 16.2* 11.2*  HGB 7.3* 7.8* 7.0*  HCT 23.6* 25.1* 21.9*  PLT 151 182 143*   Recent Labs  Lab 01/21/20 1620 01/22/20 0244 01/23/20 0452  NA 144 143 141  K 3.3* 3.6 3.9  CL 111 109  110  CO2 16* 21* 22  BUN 42* 44* 46*  CREATININE 3.32* 3.42* 3.02*  CALCIUM 8.6* 8.6* 8.3*  PROT 5.9* 6.1*  --   BILITOT 0.8 0.7  --   ALKPHOS 43 36*  --   ALT 16 19  --   AST 20 44*  --   GLUCOSE 189* 146* 115*    A1C: 6.1  Imaging/Diagnostic Tests:   Echo: EF 55-60%. No wall motion abnormalities and Grade III diastolic dysfunction. Left atrium severley dilated. Right atrium mildly dilated. All other structures  examined grossly normal.   VAS US Carotid: Right Carotid: Velocities in the right ICA are consistent with a 1-39%  stenosis.  Left Carotid: Velocities in the left ICA are consistent with a 1-39%  stenosis.  Vertebrals: Bilateral vertebral arteries demonstrate antegrade flow.   MR Angio Head WO Contrast: Mildly motion degraded exam. Negative intracranial MRA for large vessel occlusion. No hemodynamically significant or correctable stenosis.   MR Brain WO Contrast: Mildly motion degraded exam. Negative intracranial MRA for large vessel occlusion. No hemodynamically significant or correctable stenosis.   CT Chest WO Contrast: Diffuse left-sided pneumonia. Cholelithiasis without complicating factors.   DG Chest Portable 1 View: Marked severity left-sided infiltrate.   CT Head Code Stroke WO Contrast: No acute intracranial abnormality. ASPECTS is 10. Motion degraded study.   Briant Cedar, MD 01/24/2020, 7:16 AM PGY-1, [place] Intern pager: [phone], text pages welcome

## 2020-01-25 DIAGNOSIS — I6302 Cerebral infarction due to thrombosis of basilar artery: Secondary | ICD-10-CM | POA: Diagnosis not present

## 2020-01-25 DIAGNOSIS — J690 Pneumonitis due to inhalation of food and vomit: Secondary | ICD-10-CM | POA: Diagnosis not present

## 2020-01-25 DIAGNOSIS — F419 Anxiety disorder, unspecified: Secondary | ICD-10-CM

## 2020-01-25 LAB — BASIC METABOLIC PANEL
Anion gap: 9 (ref 5–15)
BUN: 54 mg/dL — ABNORMAL HIGH (ref 8–23)
CO2: 21 mmol/L — ABNORMAL LOW (ref 22–32)
Calcium: 8.3 mg/dL — ABNORMAL LOW (ref 8.9–10.3)
Chloride: 110 mmol/L (ref 98–111)
Creatinine, Ser: 3.06 mg/dL — ABNORMAL HIGH (ref 0.61–1.24)
GFR, Estimated: 20 mL/min — ABNORMAL LOW (ref >60)
Glucose, Bld: 140 mg/dL — ABNORMAL HIGH (ref 70–99)
Potassium: 4.1 mmol/L (ref 3.5–5.1)
Sodium: 140 mmol/L (ref 135–145)

## 2020-01-25 LAB — CBC
HCT: 21.8 % — ABNORMAL LOW (ref 39.0–52.0)
Hemoglobin: 6.9 g/dL — CL (ref 13.0–17.0)
MCH: 26.4 pg (ref 26.0–34.0)
MCHC: 31.7 g/dL (ref 30.0–36.0)
MCV: 83.5 fL (ref 80.0–100.0)
Platelets: 155 10*3/uL (ref 150–400)
RBC: 2.61 MIL/uL — ABNORMAL LOW (ref 4.22–5.81)
RDW: 14 % (ref 11.5–15.5)
WBC: 8.6 10*3/uL (ref 4.0–10.5)
nRBC: 0 % (ref 0.0–0.2)

## 2020-01-25 LAB — GLUCOSE, CAPILLARY
Glucose-Capillary: 131 mg/dL — ABNORMAL HIGH (ref 70–99)
Glucose-Capillary: 124 mg/dL — ABNORMAL HIGH (ref 70–99)
Glucose-Capillary: 160 mg/dL — ABNORMAL HIGH (ref 70–99)
Glucose-Capillary: 157 mg/dL — ABNORMAL HIGH (ref 70–99)
Glucose-Capillary: 147 mg/dL — ABNORMAL HIGH (ref 70–99)
Glucose-Capillary: 121 mg/dL — ABNORMAL HIGH (ref 70–99)

## 2020-01-25 LAB — PREPARE RBC (CROSSMATCH)

## 2020-01-25 LAB — HEMOGLOBIN AND HEMATOCRIT, BLOOD
HCT: 25.3 % — ABNORMAL LOW (ref 39.0–52.0)
Hemoglobin: 8 g/dL — ABNORMAL LOW (ref 13.0–17.0)

## 2020-01-25 LAB — ABO/RH: ABO/RH(D): B POS

## 2020-01-25 MED ORDER — HYDRALAZINE HCL 25 MG PO TABS: 25.0000 mg | ORAL_TABLET | Freq: Three times a day (TID) | ORAL | Status: DC

## 2020-01-25 MED ORDER — SODIUM CHLORIDE 0.9% IV SOLUTION: Freq: Once | INTRAVENOUS | Status: AC

## 2020-01-25 MED ORDER — HYDROXYZINE HCL 25 MG PO TABS
25.0000 mg | ORAL_TABLET | Freq: Three times a day (TID) | ORAL | Status: DC | PRN
  Administered 2020-01-25: 25 mg via ORAL
  Filled 2020-01-25: qty 1

## 2020-01-25 MED ORDER — HYDRALAZINE HCL 25 MG PO TABS
25.0000 mg | ORAL_TABLET | Freq: Three times a day (TID) | ORAL | Status: DC
  Administered 2020-01-25 (×2): 25 mg via ORAL
  Filled 2020-01-25 (×2): qty 1

## 2020-01-25 MED ORDER — ACETAMINOPHEN 325 MG PO TABS
650.0000 mg | ORAL_TABLET | Freq: Four times a day (QID) | ORAL | Status: DC | PRN
  Administered 2020-01-25 (×2): 650 mg via ORAL
  Filled 2020-01-25 (×3): qty 2

## 2020-01-25 MED ORDER — HYDRALAZINE HCL 10 MG PO TABS
10.0000 mg | ORAL_TABLET | Freq: Three times a day (TID) | ORAL | Status: DC
  Administered 2020-01-25: 10 mg via ORAL
  Filled 2020-01-25: qty 1

## 2020-01-25 NOTE — Progress Notes (Signed)
Inpatient Rehab Admissions Coordinator:   Insurance authorization pending.  I will not have a bed available for this patient to admit to CIR today.  Will continue to follow for possible admit pending insurance auth and bed availability.    Shann Medal, PT, DPT Admissions Coordinator [phone] 01/25/20  11:27 AM

## 2020-01-25 NOTE — Progress Notes (Signed)
Family Medicine Teaching Service Daily Progress Note Intern Pager: [phone]  Patient name: Todd [name] Medical record number: [id number] Date of birth: October 29, 1940 Age: 79 y.o. Gender: male  Primary Care Provider: Charlsie Merles, MD Consultants: Neurology, Cardiology Code Status: Full  Pt Overview and Major Events to Date:  10/18 Admitted 10/22 1 Unit pRBC transfused  Assessment and Plan:   Todd [name] a 79 y.o.malepresenting with altered mental status and blown pupils shortly after EGD concerning for acute CVA and acute hypoxemic respiratory failure likely secondary to pneumonia. PMH is significant forparoxysmal A Fib (on apixaban), TIA, T2DM, HTN, HLD, CKD, GERD, iron deficiency anemia.   Acute hypoxemic respiratory failure possible aspiration pneumonia  Sepsis ruled out Patient underwent EGD10/18 later was weak andfoundunresponsivewith agonal breathing.CXRshowedmarked left-sided infiltrate concerning for pneumonia. CT chest was also obtained with findings consistent with diffuse left-sided pneumonia. Must considerPE causing acute hypoxia and hemodynamic instability as pt has been off anticoagulation for >3 days. Wells score for PE 4.5 (moderate risk).Has continued to do well on oral antibiotics. 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 MRI of the brain did reveal potential subtle acute ischemic infarct in the ventral left  medulla versus artifact. Neurology will continue to follow before making a diagnosis of CVA. Of note,patient's anticoagulation was held for a few days for procedure today (wife states his last dose of apixaban was this past Friday10/15).Has history of TIA 06/2017.Stroke swallow screen passedin the ED.Carotid US shows no blockages. -Appreciate Neurologyrecommendations -Monitor limb weakness with serial exams -Neurologyrecommends normotensive BP goal -Lipid panel   AFib with RVR A fib in RVRto 130in the ED, rate controlled s/p10 mg IVdiltiazem.Tachycardic up to 200 prior to arrival s/p cardioversions x2 without significant change.Restarted Carvedilol and Eliquis yesterday and will continue to monitor. -Appreciate Cardiology recommendations -Continue Eliquis 2.5BIDgiven pt has been off anticoagulation for >3 days and risk of stroke outweighs risk of acute hemorrhage -ContinueCarvedilol to 12.5mg  BID -Cardiac monitoring   Anion gap metabolic acidosis On admission elevated lactate 5.5.with anion gap metabolic acidosis (bicarb 16, AG 17). This morning anion gap is 9. -Daily BMP   Hypokalemia K 3.3on admission. K this morning is4.1. -Monitor with daily BMP -Replete as necessary   HTN  Home meds:amlodipine 10 mg, doxazosin (8 mg AM, 4 mg bedtime), hydralazine 100 mg TID, losartan 100 mg. Have clarified with patient that he once took Nifedipine but stopped, he is taking all others.BP today is 185/78. Will restart Hydralazine. 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 BNP 185.1.Home medication is Furosemide 40 mg MWF. S/p IV furosemide 40 mg in the ED.Echo shows improved EF of 55-60%. -Holding furosemide (permissive HTN) -strict I/O -Daily  weights   HLD Home meds: atorvastatin 80 mg. -Atorvastatin 80mg  once daily -Follow up lipid panel   Asthma Home meds: albuterol prn -Continue albuterol prn  Diabetes Last A1c7.6 in 2019.Not on any antihyperglycemic agents. A1C is 6.1. CBG have been decently controlled. -CBGs Q4H -Start sSSI if needed   AKI onCKD Cr3.30on admission.Baseline appears to be around 2.6-2.7. Follows with nephrology at the [facility] in [place].Creatinine today is3.06. 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 Home meds: ferrous sulfate 325 mg daily, vitamin B12 1000 mcg daily.Hgb today is 6.9. 1 unit of pRBCs was ordered for transfusion. Post transfusion Hgb is 8.0. -ContinueFerrousSulphate 325mg  daily -Monitor Hgb q12 to ensure stability   GERD Home meds: omeprazole 40 mg BID, sucralfate 1 g BID. -Continuepantoprazole40mg per formulary   BPH Home meds: doxazosin (8 mg AM, 4 mg PM), finasteride 5 mg. -Holdingboth doxazosin and finasteride(can cause hypotension)   FEN/GI: Heart Healthy PPx: Eliquis  Disposition: Med Tele  Prior to Admission Living Arrangement: Home Anticipated Discharge Location: CIR Barriers to Discharge: CIR Placement Anticipated discharge in approximately 1-3 day(s).   Subjective:  Interviewed patient at bedside.  He reports doing ok today. States his breathing is still good and denies chest pain. He has no other complaints at this time.  Objective: Temp:  [98.1 F (36.7 C)-100 F (37.8 C)] 98.5 F (36.9 C) (10/22 0551) Pulse Rate:  [58-71] 69 (10/22 0551) Resp:  [17-20] 18 (10/22 0551) BP: (129-185)/(65-100) 185/78 (10/22 0551) SpO2:  [97 %-100 %] 97 % (10/22 0551) Physical Exam:  Physical Exam Vitals and nursing note reviewed.  Constitutional:      General: He is not in acute distress.    Appearance: Normal appearance. He is not ill-appearing,  toxic-appearing or diaphoretic.  HENT:     Head: Normocephalic and atraumatic.  Cardiovascular:     Rate and Rhythm: Normal rate and regular rhythm.     Pulses: Normal pulses.          Radial pulses are 2+ on the right side and 2+ on the left side.       Dorsalis pedis pulses are 2+ on the right side and 2+ on the left side.     Heart sounds: Normal heart sounds, S1 normal and S2 normal.  Pulmonary:     Effort: Pulmonary effort is normal. No respiratory distress.     Breath sounds: Normal breath sounds. No wheezing.  Abdominal:     General: There is no distension.     Tenderness: There is no abdominal tenderness.  Musculoskeletal:     Right lower leg: No edema.     Left lower leg: No edema.  Neurological:     Mental Status: He is alert.      Laboratory: Recent Labs  Lab 01/24/20 0536 01/24/20 0835 01/25/20 0122  WBC 11.2* 11.4* 8.6  HGB 7.0* 7.4* 6.9*  HCT 21.9* 23.4* 21.8*  PLT 143* 161 155   Recent Labs  Lab 01/21/20 1620 01/21/20 1620 01/22/20 0244 01/22/20 0244 01/23/20 0452 01/24/20 0805 01/25/20 0122  NA 144   < > 143   < > 141 141 140  K 3.3*   < > 3.6   < > 3.9 4.3 4.1  CL 111   < > 109   < > 110 111 110  CO2 16*   < > 21*   < > 22 20* 21*  BUN 42*   < > 44*   < > 46* 49* 54*  CREATININE 3.32*   < > 3.42*   < > 3.02* 2.96* 3.06*  CALCIUM 8.6*   < > 8.6*   < >  8.3* 8.4* 8.3*  PROT 5.9*  --  6.1*  --   --   --   --   BILITOT 0.8  --  0.7  --   --   --   --   ALKPHOS 43  --  36*  --   --   --   --   ALT 16  --  19  --   --   --   --   AST 20  --  44*  --   --   --   --   GLUCOSE 189*   < > 146*   < > 115* 123* 140*   < > = values in this interval not displayed.     Imaging/Diagnostic Tests:   Echo: EF 55-60%. No wall motion abnormalities and Grade III diastolic dysfunction. Left atrium severley dilated. Right atrium mildly dilated. All other structures examined grossly normal.   VAS US Carotid: Right Carotid: Velocities in the right ICA are  consistent with a 1-39%  stenosis.  Left Carotid: Velocities in the left ICA are consistent with a 1-39%  stenosis.  Vertebrals: Bilateral vertebral arteries demonstrate antegrade flow.   MR Angio Head WO Contrast:Mildly motion degraded exam. Negative intracranial MRA for large vessel occlusion. No hemodynamically significant or correctable stenosis.   MR Brain WO Contrast:Mildly motion degraded exam. Negative intracranial MRA for large vessel occlusion. No hemodynamically significant or correctable stenosis.   CT Chest WO Contrast:Diffuse left-sided pneumonia. Cholelithiasis without complicating factors.   DG Chest Portable 1 View:Marked severity left-sided infiltrate.   CT Head Code Stroke WO Contrast:No acute intracranial abnormality. ASPECTS is 10.Motion degraded study.   Briant Cedar, MD 01/25/2020, 6:09 AM PGY-1, [place] Intern pager: [phone], text pages welcome

## 2020-01-25 NOTE — Progress Notes (Signed)
PT Cancellation Note  Patient Details Name: Todd [name] MRN: [id number] DOB: 10/02/1940   Cancelled Treatment:    Reason Eval/Treat Not Completed: Patient declined, no reason specified. Pt reports he does not feel like moving today. PT is more withdrawn and appears frustrated to still be here in the hospital. Pt with questions about this therapists role, and seems to have a poor understanding of acute PT versus post-acute rehab. PT attempts to provide education. Pt defers PT at this time.   Zenaida Niece 01/25/2020, 1:30 PM

## 2020-01-25 NOTE — Progress Notes (Signed)
FPTS Interim Progress Note  S: Paged by nurse that patient was agitated and wanted to leave. Went to see patient and wife was at bedside. He reported that he was a little anxious and wanted to get up and walk around. Stated that he did not want to leave, wife reports that he likes to be in control and had similar issues during a previous hospitalization. When asked he stated he would like something to help calm him down.   O: BP (!) 167/96 (BP Location: Right Arm)    Pulse 80    Temp 98.4 F (36.9 C) (Oral)    Resp 18    Ht 6\' 1"  (1.854 m)    Wt 228 lb 9.9 oz (103.7 kg)    SpO2 99%    BMI 30.16 kg/m     A/P: Will order Atarax 25 mg TID PRN. Will continue to monitor.  Briant Cedar, MD 01/25/2020, 6:29 PM PGY-1, [place] Medicine Service pager [phone]

## 2020-01-25 NOTE — Progress Notes (Signed)
Patient noted to be agitated, anxious and confused he states to  he wanting to go home. This nurse tried to redirect patient several times, MD was also notified of patient condition. MD was in room to assess patient and ordered atarax 25 mg, which was given to patient at 1815. Patient is in room at this time and wife at the bed side. Will continue to monitor.

## 2020-01-25 NOTE — Progress Notes (Addendum)
Family Medicine Teaching Service Daily Progress Note Intern Pager: [phone]  Patient name: Todd [name] Medical record number: [id number] Date of birth: Oct 12, 1940 Age: 79 y.o. Gender: male  Primary Care Provider: Charlsie Merles, MD Consultants: Neurology, Cardiology Code Status: Full   Pt Overview and Major Events to Date:  10/18 Admitted 10/22 1 Unit pRBC transfused  Assessment and Plan:  Todd [name] a 79 y.o.malepresenting with altered mental status and blown pupils shortly after EGD concerning for acute CVA and acute hypoxemic respiratory failure likely secondary to pneumonia. PMH is significant forparoxysmal A Fib (on apixaban), TIA, T2DM, HTN, HLD, CKD, GERD, iron deficiency anemia.  Acute hypoxemic respiratory failure possible aspiration pneumonia Sepsis ruled out, improving  Denies dyspnea, chest pain, dizziness, fevers etc. Sats 100% on room air. RR 18.  Afebrile. On exam: Few bibasal crackles, normal WOB.  Blood cultures no growth at 4 days. S/p Amp-Sulbactam (10/18), Augmentin (10/21-) -ContinueAugmentin 500-125 mg BID (Day 4 of 8) -Continue cardiac monitoringand continuous pulse oximetry -Am CBC, CMP -Monitor fever curvewill obtainnewblood cultures -Pending discharge to CIR  Left medullaryCVA-stable  CT Head - No acute intracranial abnormality. MRI head - Subtle 4 mm focus of diffusion abnormality involving the ventral left medulla. No associated hemorrhage or mass effect. -Appreciate Neurologyrecommendations -Neurologysigned off on 10/20 -Continue Eliquis 2.5mg  BID -Normotensive BP goals per neurology  -Hold ASA 81mg  per cardiology   AFib with RVR, resolved  HR 77 -Continue Eliquis 2.5BID -ContinueCarvedilol to 12.5mg  BID -Cardiac monitoring  Normocyticanemia, treating  Hb 6.9 yesterday. S/p 1 uRBC. Post transfusion this morning H&H 8.4.    Home meds: ferrous sulfate 325 mg daily, vitamin B12 1000 mcg  daily. -ContinueFerrousSulphate 325mg  daily -Daily CBC  HTN, treating SBP 167-197, DBP 85-96 Home meds:amlodipine 10 mg, doxazosin (8 mg AM, 4 mg bedtime), hydralazine 100 mg TID, losartan 100 mg. Have clarified with patient that he once took Nifedipine but stopped, he is taking all others. -Continue to monitor BP -Increase Hydralazine to 37.5mg  TID as Bps persistently elevated -ContinueCarvedilol to 12.5mg  BID -BP goal-normotension per neurology  Hypokalemia, resolved K this morning is4.3 -Monitor with daily BMP -Replete as necessary  Anion gap metabolic acidosis, resolved  On admission elevated lactate 5.5.with anion gap metabolic acidosis. -Daily BMP  HFmrEF Echo shows improved EF of 55-60%. Home medication is Furosemide 40 mg MWF.  -Holding furosemide (permissive HTN) -strict I/O -Daily weights  HLD Home meds: atorvastatin 80 mg. -Atorvastatin 80mg  once daily  Asthma Home meds: albuterol prn -Continue albuterol prn  Diabetes A1C is 6.1 on admission.  CBGs 137 overnight.  Not on any antihyperglycemic agents. -CBGs Q4H -Start sSSI if needed  AKI onCKD, improving Cr3.36> 2.57. Baseline appears to be around 2.6-2.7. Follows with nephrology at the [facility] in [place]. -Monitorwith BMP -Avoid nephrotoxic agents   GERD Home meds: omeprazole 40 mg BID, sucralfate 1 g BID. -Continuepantoprazole40mg per formulary  BPH Home meds: doxazosin (8 mg AM, 4 mg PM), finasteride 5 mg. -Holdingboth doxazosin and finasteride(can cause hypotension)  FEN/GI: Heart Healthy PPx: Eliquis  Subjective:  Feels well, denies concerns overnight.  Objective: Temp:  [98.1 F (36.7 C)-99.2 F (37.3 C)] 98.7 F (37.1 C) (10/22 1935) Pulse Rate:  [64-80] 76 (10/22 1935) Resp:  [18-20] 18 (10/22 1935) BP: (150-197)/(65-96) 197/85 (10/22 1935) SpO2:  [97 %-100 %] 100 % (10/22 1935)  Physical Exam: General: Pleasant AA 79 yr old male, appears stated age,  no acute distress Cardiovascular: Afib, no m/r/g Respiratory: CTAB, normal WOB Abdomen: soft non tender,  bowel sounds present  Extremities: No peripheral edema  Laboratory: Recent Labs  Lab 01/24/20 0536 01/24/20 0536 01/24/20 0835 01/25/20 0122 01/25/20 1136  WBC 11.2*  --  11.4* 8.6  --   HGB 7.0*   < > 7.4* 6.9* 8.0*  HCT 21.9*   < > 23.4* 21.8* 25.3*  PLT 143*  --  161 155  --    < > = values in this interval not displayed.   Recent Labs  Lab 01/21/20 1620 01/21/20 1620 01/22/20 0244 01/22/20 0244 01/23/20 0452 01/24/20 0805 01/25/20 0122  NA 144   < > 143   < > 141 141 140  K 3.3*   < > 3.6   < > 3.9 4.3 4.1  CL 111   < > 109   < > 110 111 110  CO2 16*   < > 21*   < > 22 20* 21*  BUN 42*   < > 44*   < > 46* 49* 54*  CREATININE 3.32*   < > 3.42*   < > 3.02* 2.96* 3.06*  CALCIUM 8.6*   < > 8.6*   < > 8.3* 8.4* 8.3*  PROT 5.9*  --  6.1*  --   --   --   --   BILITOT 0.8  --  0.7  --   --   --   --   ALKPHOS 43  --  36*  --   --   --   --   ALT 16  --  19  --   --   --   --   AST 20  --  44*  --   --   --   --   GLUCOSE 189*   < > 146*   < > 115* 123* 140*   < > = values in this interval not displayed.      Imaging/Diagnostic Tests: No results found. Todd Haw, MD 01/25/2020, 9:48 PM PGY-2, [place] Intern pager: [phone], text pages welcome

## 2020-01-25 NOTE — Progress Notes (Signed)
CRITICAL VALUE ALE Critical Value:  Hemoglobin = 6.9  Date & Time Notied: 01/25/2020, 0240 Provider Notified:   Orders Received/Actions taken:page to Sun.)

## 2020-01-25 NOTE — Progress Notes (Signed)
Called pt to inform him his Hb is 6.9 and he requires a blood transfusion. He understood and gave consent over the phone. Ordered 1 URBC.   Lattie Haw MD  PGY 2, [place]

## 2020-01-26 ENCOUNTER — Encounter (HOSPITAL_COMMUNITY): Payer: Self-pay | Admitting: Family Medicine

## 2020-01-26 DIAGNOSIS — F419 Anxiety disorder, unspecified: Secondary | ICD-10-CM | POA: Diagnosis not present

## 2020-01-26 DIAGNOSIS — I4891 Unspecified atrial fibrillation: Secondary | ICD-10-CM | POA: Diagnosis not present

## 2020-01-26 DIAGNOSIS — I6302 Cerebral infarction due to thrombosis of basilar artery: Secondary | ICD-10-CM | POA: Diagnosis not present

## 2020-01-26 DIAGNOSIS — N184 Chronic kidney disease, stage 4 (severe): Secondary | ICD-10-CM

## 2020-01-26 DIAGNOSIS — J690 Pneumonitis due to inhalation of food and vomit: Secondary | ICD-10-CM | POA: Diagnosis not present

## 2020-01-26 DIAGNOSIS — I10 Essential (primary) hypertension: Secondary | ICD-10-CM

## 2020-01-26 DIAGNOSIS — E1165 Type 2 diabetes mellitus with hyperglycemia: Secondary | ICD-10-CM

## 2020-01-26 LAB — BPAM RBC
Blood Product Expiration Date: 202111142359
ISSUE DATE / TIME: 202110220554
Unit Type and Rh: 7300

## 2020-01-26 LAB — BASIC METABOLIC PANEL
Anion gap: 8 (ref 5–15)
BUN: 45 mg/dL — ABNORMAL HIGH (ref 8–23)
CO2: 22 mmol/L (ref 22–32)
Calcium: 8.6 mg/dL — ABNORMAL LOW (ref 8.9–10.3)
Chloride: 112 mmol/L — ABNORMAL HIGH (ref 98–111)
Creatinine, Ser: 2.57 mg/dL — ABNORMAL HIGH (ref 0.61–1.24)
GFR, Estimated: 24 mL/min — ABNORMAL LOW (ref >60)
Glucose, Bld: 138 mg/dL — ABNORMAL HIGH (ref 70–99)
Potassium: 4.3 mmol/L (ref 3.5–5.1)
Sodium: 142 mmol/L (ref 135–145)

## 2020-01-26 LAB — GLUCOSE, CAPILLARY
Glucose-Capillary: 144 mg/dL — ABNORMAL HIGH (ref 70–99)
Glucose-Capillary: 137 mg/dL — ABNORMAL HIGH (ref 70–99)
Glucose-Capillary: 116 mg/dL — ABNORMAL HIGH (ref 70–99)
Glucose-Capillary: 145 mg/dL — ABNORMAL HIGH (ref 70–99)
Glucose-Capillary: 135 mg/dL — ABNORMAL HIGH (ref 70–99)
Glucose-Capillary: 150 mg/dL — ABNORMAL HIGH (ref 70–99)

## 2020-01-26 LAB — CULTURE, BLOOD (ROUTINE X 2)
Culture: NO GROWTH
Culture: NO GROWTH
Special Requests: ADEQUATE
Special Requests: ADEQUATE

## 2020-01-26 LAB — TYPE AND SCREEN
ABO/RH(D): B POS
Antibody Screen: NEGATIVE
Unit division: 0

## 2020-01-26 LAB — CBC
HCT: 26.2 % — ABNORMAL LOW (ref 39.0–52.0)
Hemoglobin: 8.4 g/dL — ABNORMAL LOW (ref 13.0–17.0)
MCH: 26.5 pg (ref 26.0–34.0)
MCHC: 32.1 g/dL (ref 30.0–36.0)
MCV: 82.6 fL (ref 80.0–100.0)
Platelets: 188 10*3/uL (ref 150–400)
RBC: 3.17 MIL/uL — ABNORMAL LOW (ref 4.22–5.81)
RDW: 13.7 % (ref 11.5–15.5)
WBC: 9.5 10*3/uL (ref 4.0–10.5)
nRBC: 0 % (ref 0.0–0.2)

## 2020-01-26 MED ORDER — HYDRALAZINE HCL 25 MG PO TABS
37.5000 mg | ORAL_TABLET | Freq: Three times a day (TID) | ORAL | Status: DC
  Administered 2020-01-26 – 2020-01-27 (×3): 37.5 mg via ORAL
  Filled 2020-01-26 (×3): qty 2

## 2020-01-26 NOTE — Progress Notes (Signed)
Family Medicine Teaching Service Daily Progress Note Intern Pager: [phone]  Patient name: Todd [name] Medical record number: [id number] Date of birth: [birth year] Age: 79 y.o. Gender: male  Primary Care Provider: Charlsie Merles, MD Consultants: Neurology, Cardiology Code Status: Full   Pt Overview and Major Events to Date:  10/18 Admitted 10/22 1 Unit pRBC transfused 10/23: awaiting CIR placement   Assessment and Plan:  Todd [name] a 79 y.o.malepresenting with altered mental status & concern for acute CVA and acute hypoxemic respiratory failure likely secondary to pneumonia. PMH is significant forparoxysmal A Fib (on apixaban), TIA, T2DM, HTN, HLD, CKD, GERD, iron deficiency anemia.  Acute hypoxemic respiratory failure  possible aspiration pneumonia, improving  Patient breathing comfortably on RA.  Blood cultures no growth at 5 days. S/p Amp-Sulbactam (10/18), Augmentin (10/21-). Patient was afebrile overnight.  CBC today shows improved WBC from 9.5 to 8.4.  -ContinueAugmentin 500-125 mg BID (Day 5 of 8) -Continue cardiac monitoring  - continuous pulse oximetry -Monitor fever curvewill obtainnewblood cultures in event that patient has new fever  -Pending discharge to CIR  Left medullaryCVA-stable  Patient reports subjective improved strength.  He reports mild headache between eyes without blurry vision. CT Head - No acute intracranial abnormality. MRI head - Subtle 4 mm focus of diffusion abnormality involving the ventral left medulla. No associated hemorrhage or mass effect. -Neurologysigned off on 10/20 -Continue Eliquis 2.5mg  BID - maintain normotensive BP per neurology recommendations  -Hold ASA 81mg  per cardiology   AFib with RVR, resolved  HR overnight ranged from 59-71 -Continue Eliquis 2.5BID -ContinueCarvedilol 12.5mg  BID -Cardiac monitoring  Normocyticanemia, treating  Hb 6.9 yesterday, today is 8.0.  Home meds: ferrous sulfate 325 mg  daily, vitamin B12 1000 mcg daily. -ContinueFerrousSulfate 325mg  daily -Daily CBC  HTN, treating BP ranges overnight hypertensive ranging from 142-184, most recent systolic of 295/62. Current medications include coreg and hydralazine. Home meds:amlodipine 10 mg, doxazosin (8 mg AM, 4 mg bedtime), hydralazine 100 mg TID, losartan 100 mg.  Patient has some edema in right upper extremity, could consider adding back home Lasix to help with diuresis.  - Continue to monitor BP - ContinueCarvedilol to 12.5mg  BID - hydralazine 37.5mg  TID  - Add partial home amlodipine at 5 mg  HFmrEF Echo shows improved EF of 55-60%. Home medication is Furosemide 40 mg MWF.  -Holding furosemide, will plan to add back once creatinine has improved -strict I/O -Daily weights  HLD Home meds: atorvastatin 80 mg. -Atorvastatin 80mg  once daily  Asthma Home meds: albuterol prn -Continue albuterol prn  Diabetes A1C is 6.1 on admission.  CBGs ranged between 120s and 130s overnight.  Not on any antihyperglycemic agents. -CBGs daily fasting   AKI onCKD, improving Cr3.36> 2.57> 2.54. Baseline appears to be around 2.6-2.7. Follows with nephrology at the [facility] in [place]. -Monitorwith BMP -Avoid nephrotoxic agents   GERD Home meds: omeprazole 40 mg BID, sucralfate 1 g BID. -Continuepantoprazole40mg per formulary  BPH Home meds: doxazosin (8 mg AM, 4 mg PM), finasteride 5 mg. -Holdingboth doxazosin and finasteride(can cause hypotension)  FEN/GI: Heart Healthy PPx: Eliquis  Subjective:  Patient reports that he is breathing comfortably and has no shortness of breath.  He does report a mild headache between his eyes without blurry vision.  Patient also states that he has mild nausea.  Objective: Temp:  [97.5 F (36.4 C)-98.9 F (37.2 C)] 97.5 F (36.4 C) (10/24 0313) Pulse Rate:  [59-71] 62 (10/24 0313) Resp:  [18] 18 (10/24 0313) BP: (172-184)/(65-85) 174/65 (10/24  2947) SpO2:   [97 %-100 %] 98 % (10/24 0313) Weight:  [104.6 kg] 104.6 kg (10/24 0313)  Physical Exam: General: male lying comfortably in bed, in no apparent distress Cardio: Patient has hyperdynamic heart sounds, no obvious murmurs on exam today Pulm: Patient demonstrates normal respiratory effort, stable on room air, no wheezing appreciated   Abdomen soft and non-tender.  Bowel sounds present Extremities: No lower extremity edema. Warm/ well perfused.  Neuro: pt alert and oriented to self, location, date of birth; demonstrates 3/5 strength in bilateral upper and lower extremities, sluggish minimal pupillary reaction bilaterally, extraocular muscles intact bilaterally however patient does not appear to be able to follow objects in far left field-of-view   Laboratory: Recent Labs  Lab 01/25/20 0122 01/25/20 0122 01/25/20 1136 01/26/20 0200 01/27/20 0152  WBC 8.6  --   --  9.5 8.4  HGB 6.9*   < > 8.0* 8.4* 8.0*  HCT 21.8*   < > 25.3* 26.2* 25.4*  PLT 155  --   --  188 193   < > = values in this interval not displayed.   Recent Labs  Lab 01/21/20 1620 01/21/20 1620 01/22/20 0244 01/23/20 0452 01/25/20 0122 01/26/20 0200 01/27/20 0152  NA 144   < > 143   < > 140 142 142  K 3.3*   < > 3.6   < > 4.1 4.3 4.1  CL 111   < > 109   < > 110 112* 110  CO2 16*   < > 21*   < > 21* 22 24  BUN 42*   < > 44*   < > 54* 45* 42*  CREATININE 3.32*   < > 3.42*   < > 3.06* 2.57* 2.54*  CALCIUM 8.6*   < > 8.6*   < > 8.3* 8.6* 8.3*  PROT 5.9*  --  6.1*  --   --   --   --   BILITOT 0.8  --  0.7  --   --   --   --   ALKPHOS 43  --  36*  --   --   --   --   ALT 16  --  19  --   --   --   --   AST 20  --  44*  --   --   --   --   GLUCOSE 189*   < > 146*   < > 140* 138* 134*   < > = values in this interval not displayed.     Imaging/Diagnostic Tests: No results found.   Todd Foster, MD 01/27/2020, 7:42 AM PGY-2, [place] Intern pager: [phone], text pages  welcome

## 2020-01-27 DIAGNOSIS — J690 Pneumonitis due to inhalation of food and vomit: Secondary | ICD-10-CM | POA: Diagnosis not present

## 2020-01-27 DIAGNOSIS — I6381 Other cerebral infarction due to occlusion or stenosis of small artery: Secondary | ICD-10-CM | POA: Diagnosis not present

## 2020-01-27 LAB — CBC
HCT: 25.4 % — ABNORMAL LOW (ref 39.0–52.0)
Hemoglobin: 8 g/dL — ABNORMAL LOW (ref 13.0–17.0)
MCH: 26.1 pg (ref 26.0–34.0)
MCHC: 31.5 g/dL (ref 30.0–36.0)
MCV: 83 fL (ref 80.0–100.0)
Platelets: 193 10*3/uL (ref 150–400)
RBC: 3.06 MIL/uL — ABNORMAL LOW (ref 4.22–5.81)
RDW: 13.6 % (ref 11.5–15.5)
WBC: 8.4 10*3/uL (ref 4.0–10.5)
nRBC: 0 % (ref 0.0–0.2)

## 2020-01-27 LAB — GLUCOSE, CAPILLARY
Glucose-Capillary: 123 mg/dL — ABNORMAL HIGH (ref 70–99)
Glucose-Capillary: 129 mg/dL — ABNORMAL HIGH (ref 70–99)

## 2020-01-27 LAB — BASIC METABOLIC PANEL
Anion gap: 8 (ref 5–15)
BUN: 42 mg/dL — ABNORMAL HIGH (ref 8–23)
CO2: 24 mmol/L (ref 22–32)
Calcium: 8.3 mg/dL — ABNORMAL LOW (ref 8.9–10.3)
Chloride: 110 mmol/L (ref 98–111)
Creatinine, Ser: 2.54 mg/dL — ABNORMAL HIGH (ref 0.61–1.24)
GFR, Estimated: 25 mL/min — ABNORMAL LOW (ref >60)
Glucose, Bld: 134 mg/dL — ABNORMAL HIGH (ref 70–99)
Potassium: 4.1 mmol/L (ref 3.5–5.1)
Sodium: 142 mmol/L (ref 135–145)

## 2020-01-27 MED ORDER — HYDRALAZINE HCL 50 MG PO TABS
50.0000 mg | ORAL_TABLET | Freq: Three times a day (TID) | ORAL | Status: DC
  Administered 2020-01-27 – 2020-01-29 (×6): 50 mg via ORAL
  Filled 2020-01-27 (×6): qty 1

## 2020-01-27 MED ORDER — AMLODIPINE BESYLATE 10 MG PO TABS
10.0000 mg | ORAL_TABLET | Freq: Every day | ORAL | Status: DC
  Administered 2020-01-28 – 2020-01-29 (×2): 10 mg via ORAL
  Filled 2020-01-27 (×2): qty 1

## 2020-01-27 MED ORDER — AMLODIPINE BESYLATE 5 MG PO TABS
5.0000 mg | ORAL_TABLET | Freq: Every day | ORAL | Status: DC
  Administered 2020-01-27: 5 mg via ORAL
  Filled 2020-01-27: qty 1

## 2020-01-27 MED ORDER — HYDRALAZINE HCL 50 MG PO TABS: 50.0000 mg | ORAL_TABLET | Freq: Three times a day (TID) | ORAL | Status: DC

## 2020-01-27 MED ORDER — AMLODIPINE BESYLATE 5 MG PO TABS: 5.0000 mg | ORAL_TABLET | Freq: Every day | ORAL | Status: DC

## 2020-01-27 MED ORDER — AMLODIPINE BESYLATE 5 MG PO TABS
5.0000 mg | ORAL_TABLET | Freq: Once | ORAL | Status: AC
  Administered 2020-01-27: 5 mg via ORAL
  Filled 2020-01-27: qty 1

## 2020-01-28 DIAGNOSIS — J690 Pneumonitis due to inhalation of food and vomit: Secondary | ICD-10-CM | POA: Diagnosis not present

## 2020-01-28 DIAGNOSIS — I6302 Cerebral infarction due to thrombosis of basilar artery: Secondary | ICD-10-CM | POA: Diagnosis not present

## 2020-01-28 LAB — CBC WITH DIFFERENTIAL/PLATELET
Abs Immature Granulocytes: 0.04 10*3/uL (ref 0.00–0.07)
Basophils Absolute: 0 10*3/uL (ref 0.0–0.1)
Basophils Relative: 1 %
Eosinophils Absolute: 0.3 10*3/uL (ref 0.0–0.5)
Eosinophils Relative: 5 %
HCT: 24.8 % — ABNORMAL LOW (ref 39.0–52.0)
Hemoglobin: 7.7 g/dL — ABNORMAL LOW (ref 13.0–17.0)
Immature Granulocytes: 1 %
Lymphocytes Relative: 13 %
Lymphs Abs: 0.9 10*3/uL (ref 0.7–4.0)
MCH: 25.8 pg — ABNORMAL LOW (ref 26.0–34.0)
MCHC: 31 g/dL (ref 30.0–36.0)
MCV: 82.9 fL (ref 80.0–100.0)
Monocytes Absolute: 0.9 10*3/uL (ref 0.1–1.0)
Monocytes Relative: 13 %
Neutro Abs: 4.5 10*3/uL (ref 1.7–7.7)
Neutrophils Relative %: 67 %
Platelets: 195 10*3/uL (ref 150–400)
RBC: 2.99 MIL/uL — ABNORMAL LOW (ref 4.22–5.81)
RDW: 13.5 % (ref 11.5–15.5)
WBC: 6.7 10*3/uL (ref 4.0–10.5)
nRBC: 0 % (ref 0.0–0.2)

## 2020-01-28 LAB — BASIC METABOLIC PANEL
Anion gap: 8 (ref 5–15)
BUN: 45 mg/dL — ABNORMAL HIGH (ref 8–23)
CO2: 23 mmol/L (ref 22–32)
Calcium: 8.3 mg/dL — ABNORMAL LOW (ref 8.9–10.3)
Chloride: 112 mmol/L — ABNORMAL HIGH (ref 98–111)
Creatinine, Ser: 2.51 mg/dL — ABNORMAL HIGH (ref 0.61–1.24)
GFR, Estimated: 25 mL/min — ABNORMAL LOW (ref >60)
Glucose, Bld: 120 mg/dL — ABNORMAL HIGH (ref 70–99)
Potassium: 4.4 mmol/L (ref 3.5–5.1)
Sodium: 143 mmol/L (ref 135–145)

## 2020-01-28 MED ORDER — GABAPENTIN 100 MG PO CAPS
100.0000 mg | ORAL_CAPSULE | Freq: Every day | ORAL | Status: DC | PRN
  Administered 2020-01-29: 100 mg via ORAL
  Filled 2020-01-28: qty 1

## 2020-01-28 MED ORDER — FINASTERIDE 5 MG PO TABS
5.0000 mg | ORAL_TABLET | Freq: Every day | ORAL | Status: DC
  Administered 2020-01-28 – 2020-01-29 (×2): 5 mg via ORAL
  Filled 2020-01-28 (×2): qty 1

## 2020-01-28 MED ORDER — DOXAZOSIN MESYLATE 4 MG PO TABS
4.0000 mg | ORAL_TABLET | Freq: Two times a day (BID) | ORAL | Status: DC
  Administered 2020-01-28 – 2020-01-29 (×3): 4 mg via ORAL
  Filled 2020-01-28 (×4): qty 1

## 2020-01-28 NOTE — Progress Notes (Signed)
FPTS Interim Progress Note  S: Paged by nurse that patients wife has been hospitalized and patient wants to visit her.    A/P: Will write order for off unit privileges so that he can be assisted to his wife's room.  Briant Cedar, MD 01/28/2020, 11:57 AM PGY-1, [place] Medicine Service pager [phone]

## 2020-01-28 NOTE — Progress Notes (Signed)
Inpatient Rehab Admissions Coordinator:   Insurance pending peer to peer today.  I will call and schedule.  I have no beds available for this patient to admit to CIR today.  Will continue to follow for timing of potential admission pending bed availability and insurance authorization.   Shann Medal, PT, DPT Admissions Coordinator [phone] 01/28/20  9:59 AM

## 2020-01-28 NOTE — Progress Notes (Signed)
Physical Therapy Treatment Patient Details Name: Todd [name] MRN: [id number] DOB: 1940/10/30 Today's Date: 01/28/2020    History of Present Illness 79 y.o. male presenting with altered mental status and blown pupils shortly after EGD concerning for acute CVA and acute hypoxemic respiratory failure likely secondary to pneumonia. PMH is significant for paroxysmal A Fib (on apixaban), TIA, T2DM, HTN, HLD, CKD, GERD, iron deficiency anemia. CT head without contrast without evidence of ICH. MRI of the brain did reveal potential subtle acute ischemic infarct in the ventral left medulla versus artifact.    PT Comments    Pt progressing well with PT today, ambulating hallway distance with use of RW and cues for form and safety. Pt continues to require physical assist for bed mobility and transfers, repeated transfer training initiated this day to improve pt tolerance and strength. Will continue to follow acutely.    Follow Up Recommendations  CIR     Equipment Recommendations  None recommended by PT    Recommendations for Other Services       Precautions / Restrictions      Mobility  Bed Mobility Overal bed mobility: Needs Assistance Bed Mobility: Supine to Sit     Supine to sit: Min assist     General bed mobility comments: Min assist for trunk elevation, step-by-step multimodal cuing for sequencing to EOB.  Transfers Overall transfer level: Needs assistance Equipment used: Rolling walker (2 wheeled) Transfers: Sit to/from Stand Sit to Stand: Min assist         General transfer comment: Min assist for initial power up, posterior steadying. Repeated sit<>stand as intervention  Ambulation/Gait Ambulation/Gait assistance: Min guard Gait Distance (Feet): 200 Feet Assistive device: Rolling walker (2 wheeled) Gait Pattern/deviations: Step-through pattern;Decreased stride length;Shuffle;Drifts right/left Gait velocity: decr   General Gait Details: Min guard for safety,  verbal cuing for upright posture, placement in RW as pt tends to stand towards R of walker, and maintaining consistent gait trajectory.   Stairs             Wheelchair Mobility    Modified Rankin (Stroke Patients Only) Modified Rankin (Stroke Patients Only) Pre-Morbid Rankin Score: No significant disability Modified Rankin: Moderately severe disability     Balance Overall balance assessment: Needs assistance Sitting-balance support: No upper extremity supported;Feet supported Sitting balance-Leahy Scale: Good Sitting balance - Comments: supervision   Standing balance support: Single extremity supported;Bilateral upper extremity supported Standing balance-Leahy Scale: Poor Standing balance comment: reliant on UE support of RW and minG                            Cognition Arousal/Alertness: Awake/alert Behavior During Therapy: WFL for tasks assessed/performed;Flat affect Overall Cognitive Status: No family/caregiver present to determine baseline cognitive functioning                                 General Comments: slow processing and delayed response time, flat affect      Exercises General Exercises - Lower Extremity Mini-Sqauts: AAROM;Both;Seated;Standing;Limitations Mini Squats Limitations: sit to stand x3 from recliner, with intermittent min assist for power up, slow eccentric lower into recliner    General Comments General comments (skin integrity, edema, etc.): HR stable, <100 during mobility      Pertinent Vitals/Pain Pain Assessment: No/denies pain    Home Living  Prior Function            PT Goals (current goals can now be found in the care plan section) Acute Rehab PT Goals Patient Stated Goal: To return to independence PT Goal Formulation: With patient Time For Goal Achievement: 02/06/20 Potential to Achieve Goals: Good Progress towards PT goals: Progressing toward goals     Frequency    Min 4X/week      PT Plan Current plan remains appropriate    Co-evaluation              AM-PAC PT "6 Clicks" Mobility   Outcome Measure  Help needed turning from your back to your side while in a flat bed without using bedrails?: None Help needed moving from lying on your back to sitting on the side of a flat bed without using bedrails?: A Little Help needed moving to and from a bed to a chair (including a wheelchair)?: A Little Help needed standing up from a chair using your arms (e.g., wheelchair or bedside chair)?: A Little Help needed to walk in hospital room?: A Little Help needed climbing 3-5 steps with a railing? : A Lot 6 Click Score: 18    End of Session Equipment Utilized During Treatment: Gait belt Activity Tolerance: Patient tolerated treatment well Patient left: in chair;with call bell/phone within reach;with chair alarm set Nurse Communication: Mobility status PT Visit Diagnosis: Other abnormalities of gait and mobility (R26.89);Muscle weakness (generalized) (M62.81);Other symptoms and signs involving the nervous system (R29.898)     Time: 0930-1000 PT Time Calculation (min) (ACUTE ONLY): 30 min  Charges:  $Gait Training: 8-22 mins $Therapeutic Activity: 8-22 mins                    Eternity Dexter E, PT Acute Rehabilitation Services Pager [phone]  Office [phone]    Jovonda Selner D Elonda Husky 01/28/2020, 10:47 AM

## 2020-01-28 NOTE — Progress Notes (Signed)
Family Medicine Teaching Service Daily Progress Note Intern Pager: [phone]  Patient name: Todd [name] Medical record number: [id number] Date of birth: 1941/03/22 Age: 79 y.o. Gender: male  Primary Care Provider: Charlsie Merles, MD Consultants: Neurology, Cardiology Code Status: Full  Pt Overview and Major Events to Date:  10/18 Admitted 10/22 1 Unit pRBC transfused  Assessment and Plan:  Todd [name] a 79 y.o.malepresenting with altered mental status and blown pupils shortly after EGD concerning for acute CVA and acute hypoxemic respiratory failure likely secondary to pneumonia. PMH is significant forparoxysmal A Fib (on apixaban), TIA, T2DM, HTN, HLD, CKD, GERD, iron deficiency anemia.   Acute hypoxemic respiratory failure possible aspiration pneumonia Sepsis ruled out, improving   Blood cultures no growth at 4 days. S/p Amp-Sulbactam (10/18-10/20), Augmentin (10/20-10/27). Remains afebrile, no chest pain, no SOB, continues to be on Room Air. -ContinueAugmentin 500-125 mg BID (Day6of 8) -Continue cardiac monitoringand continuous pulse oximetry -Am CBC, CMP -Monitor fever curvewill obtainnewblood cultures -Pending discharge to CIR   Left medullaryCVA-stable  CT Head -No acute intracranial abnormality.MRI head -Subtle 4 mm focus of diffusion abnormality involving the ventral left medulla. No associated hemorrhage or mass effect. -Appreciate Neurologyrecommendations -Neurologysigned off on 10/20 -Continue Eliquis 2.5mg  BID -Normotensive BP goals per neurology  -Hold ASA 81mg  per cardiology    AFib with RVR, resolved  Heart rate is stable. -ContinueEliquis2.5BID -ContinueCarvedilol to 12.5mg  BID -Cardiac monitoring   Normocyticanemia, treating  S/p 1 uRBC. Hgb this morning is 7.7.    Home meds: ferrous sulfate 325 mg daily, vitamin B12 1000 mcg daily. -ContinueFerrousSulphate 325mg  daily -Daily CBC   Anxiety: Reports being  anxious today and would like something for it. -Will start Gabapentin 100 mg PRN daily   HTN, treating BP in the 315'V Systolic. Home meds:amlodipine 10 mg, doxazosin (8 mg AM, 4 mg bedtime), hydralazine 100 mg TID, losartan 100 mg. Have clarified with patient that he once took Nifedipine but stopped, he is taking all others.Restarting BPH medications so will monitor response before increasing anti-hypertensive's further. -Continue to monitor BP -Continue Hydralazine to 50 mg TID   -ContinueCarvedilol to 12.5mg  BID -Continue Amlodipine 10 mg -BP goal-normotension per neurology   Hypokalemia, resolved K this morning is4.4 -Monitor with daily BMP -Replete as necessary   Anion gap metabolic acidosis, resolved  On admission elevated lactate 5.5.with anion gap metabolic acidosis. 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 Home medication is Furosemide 40 mg MWF.  -Holding furosemide (permissive HTN) -strict I/O -Daily weights   HLD Home meds: atorvastatin 80 mg. -Atorvastatin 80mg  once daily   Asthma Home meds: albuterol prn -Continue albuterol prn   Diabetes A1C is 6.1 on admission.  CBGs 120 overnight.  Not on any antihyperglycemic agents. -CBGs Q4H -Start sSSI if needed   AKI onCKD, improving Cr3.36> 2.57. Baseline appears to be around 2.6-2.7. Follows with nephrology at the [facility] in [place]. -Monitorwith BMP -Avoid nephrotoxic agents    GERD Home meds: omeprazole 40 mg BID, sucralfate 1 g BID. -Continuepantoprazole40mg per formulary   BPH Home meds: doxazosin (8 mg AM, 4 mg PM), finasteride 5 mg. -Restart Finasteride 5 mg daily -Restart Doxazosin at 4 mg BID   FEN/GI: Heart Healthy PPx: Eliquis  Disposition: Tele Med  Prior to Admission Living Arrangement: Home Anticipated Discharge Location: CIR Barriers to Discharge: CIR placement Anticipated discharge in approximately 1-4 day(s).   Subjective:  Interviewed  patient at bedside.  Reports doing well today. Has no issues with breathing. Reports he  is feeling anxious and would like something for it. He has no other complaints at this time.  Objective: Temp:  [97.8 F (36.6 C)-98.7 F (37.1 C)] 97.9 F (36.6 C) (10/25 0423) Pulse Rate:  [52-68] 67 (10/25 0423) Resp:  [16-20] 18 (10/25 0423) BP: (170-187)/(77-86) 177/80 (10/25 0423) SpO2:  [98 %-100 %] 100 % (10/25 0423) Physical Exam:  Physical Exam Vitals and nursing note reviewed.  Constitutional:      General: He is not in acute distress.    Appearance: Normal appearance. He is not ill-appearing, toxic-appearing or diaphoretic.  HENT:     Head: Normocephalic and atraumatic.  Cardiovascular:     Rate and Rhythm: Normal rate and regular rhythm.     Pulses: Normal pulses.          Radial pulses are 2+ on the right side and 2+ on the left side.       Dorsalis pedis pulses are 2+ on the right side and 2+ on the left side.     Heart sounds: Normal heart sounds, S1 normal and S2 normal. No murmur heard.   Pulmonary:     Effort: Pulmonary effort is normal. No respiratory distress.     Breath sounds: Normal breath sounds. No wheezing.  Abdominal:     General: There is no distension.     Tenderness: There is no abdominal tenderness.  Musculoskeletal:     Right lower leg: No edema.     Left lower leg: No edema.  Neurological:     Mental Status: He is alert.      Laboratory: Recent Labs  Lab 01/26/20 0200 01/27/20 0152 01/28/20 0346  WBC 9.5 8.4 6.7  HGB 8.4* 8.0* 7.7*  HCT 26.2* 25.4* 24.8*  PLT 188 193 195   Recent Labs  Lab 01/21/20 1620 01/21/20 1620 01/22/20 0244 01/23/20 0452 01/26/20 0200 01/27/20 0152 01/28/20 0346  NA 144   < > 143   < > 142 142 143  K 3.3*   < > 3.6   < > 4.3 4.1 4.4  CL 111   < > 109   < > 112* 110 112*  CO2 16*   < > 21*   < > 22 24 23   BUN 42*   < > 44*   < > 45* 42* 45*  CREATININE 3.32*   < > 3.42*   < > 2.57* 2.54* 2.51*  CALCIUM  8.6*   < > 8.6*   < > 8.6* 8.3* 8.3*  PROT 5.9*  --  6.1*  --   --   --   --   BILITOT 0.8  --  0.7  --   --   --   --   ALKPHOS 43  --  36*  --   --   --   --   ALT 16  --  19  --   --   --   --   AST 20  --  44*  --   --   --   --   GLUCOSE 189*   < > 146*   < > 138* 134* 120*   < > = values in this interval not displayed.      Imaging/Diagnostic Tests:   Echo: EF 55-60%. No wall motion abnormalities andGrade III diastolic dysfunction. Left atrium severley dilated. Right atrium mildly dilated. All other structures examined grossly normal.   VAS US Carotid:Right Carotid: Velocities in the right ICA are consistent with a  1-39%  stenosis.  Left Carotid: Velocities in the left ICA are consistent with a 1-39%  stenosis.  Vertebrals: Bilateral vertebral arteries demonstrate antegrade flow.   MR Angio Head WO Contrast:Mildly motion degraded exam. Negative intracranial MRA for large vessel occlusion. No hemodynamically significant or correctable stenosis.   MR Brain WO Contrast:Mildly motion degraded exam. Negative intracranial MRA for large vessel occlusion. No hemodynamically significant or correctable stenosis.   CT Chest WO Contrast:Diffuse left-sided pneumonia. Cholelithiasis without complicating factors.   DG Chest Portable 1 View:Marked severity left-sided infiltrate.   CT Head Code Stroke WO Contrast:No acute intracranial abnormality. ASPECTS is 10.Motion degraded study.   Briant Cedar, MD 01/28/2020, 5:53 AM PGY-1, [place] Intern pager: [phone], text pages welcome

## 2020-01-29 ENCOUNTER — Other Ambulatory Visit: Payer: Self-pay

## 2020-01-29 ENCOUNTER — Encounter (HOSPITAL_COMMUNITY): Payer: Self-pay | Admitting: Physical Medicine and Rehabilitation

## 2020-01-29 ENCOUNTER — Inpatient Hospital Stay (HOSPITAL_COMMUNITY)
Admission: RE | Admit: 2020-01-29 | Discharge: 2020-02-11 | DRG: 056 | Disposition: A | Discharge status: Home Health | Payer: Medicare PPO | Source: Intra-hospital | Attending: Physical Medicine and Rehabilitation | Admitting: Physical Medicine and Rehabilitation

## 2020-01-29 DIAGNOSIS — E785 Hyperlipidemia, unspecified: Secondary | ICD-10-CM | POA: Diagnosis present

## 2020-01-29 DIAGNOSIS — I10 Essential (primary) hypertension: Secondary | ICD-10-CM | POA: Diagnosis present

## 2020-01-29 DIAGNOSIS — G8929 Other chronic pain: Secondary | ICD-10-CM | POA: Diagnosis present

## 2020-01-29 DIAGNOSIS — K219 Gastro-esophageal reflux disease without esophagitis: Secondary | ICD-10-CM | POA: Diagnosis present

## 2020-01-29 DIAGNOSIS — I69351 Hemiplegia and hemiparesis following cerebral infarction affecting right dominant side: Secondary | ICD-10-CM | POA: Diagnosis not present

## 2020-01-29 DIAGNOSIS — Z9109 Other allergy status, other than to drugs and biological substances: Secondary | ICD-10-CM | POA: Diagnosis not present

## 2020-01-29 DIAGNOSIS — Z86718 Personal history of other venous thrombosis and embolism: Secondary | ICD-10-CM

## 2020-01-29 DIAGNOSIS — K317 Polyp of stomach and duodenum: Secondary | ICD-10-CM | POA: Diagnosis present

## 2020-01-29 DIAGNOSIS — K5909 Other constipation: Secondary | ICD-10-CM | POA: Diagnosis present

## 2020-01-29 DIAGNOSIS — Z833 Family history of diabetes mellitus: Secondary | ICD-10-CM

## 2020-01-29 DIAGNOSIS — I69322 Dysarthria following cerebral infarction: Secondary | ICD-10-CM

## 2020-01-29 DIAGNOSIS — I6330 Cerebral infarction due to thrombosis of unspecified cerebral artery: Secondary | ICD-10-CM | POA: Diagnosis not present

## 2020-01-29 DIAGNOSIS — I480 Paroxysmal atrial fibrillation: Secondary | ICD-10-CM | POA: Diagnosis present

## 2020-01-29 DIAGNOSIS — J690 Pneumonitis due to inhalation of food and vomit: Secondary | ICD-10-CM | POA: Diagnosis present

## 2020-01-29 DIAGNOSIS — D638 Anemia in other chronic diseases classified elsewhere: Secondary | ICD-10-CM | POA: Diagnosis present

## 2020-01-29 DIAGNOSIS — I6302 Cerebral infarction due to thrombosis of basilar artery: Secondary | ICD-10-CM | POA: Diagnosis not present

## 2020-01-29 DIAGNOSIS — E1165 Type 2 diabetes mellitus with hyperglycemia: Secondary | ICD-10-CM | POA: Diagnosis not present

## 2020-01-29 DIAGNOSIS — K5901 Slow transit constipation: Secondary | ICD-10-CM | POA: Diagnosis not present

## 2020-01-29 DIAGNOSIS — I129 Hypertensive chronic kidney disease with stage 1 through stage 4 chronic kidney disease, or unspecified chronic kidney disease: Secondary | ICD-10-CM | POA: Diagnosis present

## 2020-01-29 DIAGNOSIS — E1122 Type 2 diabetes mellitus with diabetic chronic kidney disease: Secondary | ICD-10-CM | POA: Diagnosis present

## 2020-01-29 DIAGNOSIS — Z79899 Other long term (current) drug therapy: Secondary | ICD-10-CM

## 2020-01-29 DIAGNOSIS — Z8679 Personal history of other diseases of the circulatory system: Secondary | ICD-10-CM

## 2020-01-29 DIAGNOSIS — R5381 Other malaise: Secondary | ICD-10-CM | POA: Diagnosis present

## 2020-01-29 DIAGNOSIS — N184 Chronic kidney disease, stage 4 (severe): Secondary | ICD-10-CM | POA: Diagnosis present

## 2020-01-29 DIAGNOSIS — J302 Other seasonal allergic rhinitis: Secondary | ICD-10-CM | POA: Diagnosis present

## 2020-01-29 DIAGNOSIS — N289 Disorder of kidney and ureter, unspecified: Secondary | ICD-10-CM | POA: Diagnosis present

## 2020-01-29 DIAGNOSIS — Z7901 Long term (current) use of anticoagulants: Secondary | ICD-10-CM | POA: Diagnosis not present

## 2020-01-29 DIAGNOSIS — D509 Iron deficiency anemia, unspecified: Secondary | ICD-10-CM | POA: Diagnosis present

## 2020-01-29 DIAGNOSIS — I4891 Unspecified atrial fibrillation: Secondary | ICD-10-CM

## 2020-01-29 DIAGNOSIS — I6389 Other cerebral infarction: Secondary | ICD-10-CM

## 2020-01-29 DIAGNOSIS — I639 Cerebral infarction, unspecified: Principal | ICD-10-CM | POA: Diagnosis present

## 2020-01-29 DIAGNOSIS — I6381 Other cerebral infarction due to occlusion or stenosis of small artery: Secondary | ICD-10-CM | POA: Diagnosis not present

## 2020-01-29 LAB — CBC
HCT: 25.2 % — ABNORMAL LOW (ref 39.0–52.0)
Hemoglobin: 7.9 g/dL — ABNORMAL LOW (ref 13.0–17.0)
MCH: 26.1 pg (ref 26.0–34.0)
MCHC: 31.3 g/dL (ref 30.0–36.0)
MCV: 83.2 fL (ref 80.0–100.0)
Platelets: 211 10*3/uL (ref 150–400)
RBC: 3.03 MIL/uL — ABNORMAL LOW (ref 4.22–5.81)
RDW: 13.4 % (ref 11.5–15.5)
WBC: 7.4 10*3/uL (ref 4.0–10.5)
nRBC: 0 % (ref 0.0–0.2)

## 2020-01-29 LAB — BASIC METABOLIC PANEL
Anion gap: 9 (ref 5–15)
BUN: 48 mg/dL — ABNORMAL HIGH (ref 8–23)
CO2: 22 mmol/L (ref 22–32)
Calcium: 8.3 mg/dL — ABNORMAL LOW (ref 8.9–10.3)
Chloride: 109 mmol/L (ref 98–111)
Creatinine, Ser: 2.64 mg/dL — ABNORMAL HIGH (ref 0.61–1.24)
GFR, Estimated: 24 mL/min — ABNORMAL LOW (ref >60)
Glucose, Bld: 190 mg/dL — ABNORMAL HIGH (ref 70–99)
Potassium: 4.3 mmol/L (ref 3.5–5.1)
Sodium: 140 mmol/L (ref 135–145)

## 2020-01-29 LAB — GLUCOSE, CAPILLARY: Glucose-Capillary: 136 mg/dL — ABNORMAL HIGH (ref 70–99)

## 2020-01-29 MED ORDER — TRAZODONE HCL 50 MG PO TABS: 25.0000 mg | ORAL_TABLET | Freq: Every evening | ORAL | Status: DC | PRN

## 2020-01-29 MED ORDER — HYDRALAZINE HCL 50 MG PO TABS
75.0000 mg | ORAL_TABLET | Freq: Three times a day (TID) | ORAL | Status: DC
  Administered 2020-01-29 – 2020-02-11 (×38): 75 mg via ORAL
  Filled 2020-01-29 (×39): qty 1

## 2020-01-29 MED ORDER — BISACODYL 10 MG RE SUPP
10.0000 mg | Freq: Every day | RECTAL | Status: DC | PRN
  Administered 2020-01-31: 10 mg via RECTAL
  Filled 2020-01-29: qty 1

## 2020-01-29 MED ORDER — DIPHENHYDRAMINE HCL 12.5 MG/5ML PO ELIX: 12.5000 mg | ORAL_SOLUTION | Freq: Four times a day (QID) | ORAL | Status: DC | PRN

## 2020-01-29 MED ORDER — VITAMIN B-12 1000 MCG PO TABS
1000.0000 ug | ORAL_TABLET | Freq: Every day | ORAL | Status: DC
  Administered 2020-01-30 – 2020-02-11 (×13): 1000 ug via ORAL
  Filled 2020-01-29 (×13): qty 1

## 2020-01-29 MED ORDER — ACETAMINOPHEN 325 MG PO TABS
325.0000 mg | ORAL_TABLET | ORAL | Status: DC | PRN
  Administered 2020-02-05: 650 mg via ORAL
  Filled 2020-01-29: qty 2

## 2020-01-29 MED ORDER — FLEET ENEMA 7-19 GM/118ML RE ENEM: 1.0000 | ENEMA | Freq: Once | RECTAL | Status: DC | PRN

## 2020-01-29 MED ORDER — GUAIFENESIN-DM 100-10 MG/5ML PO SYRP: 5.0000 mL | ORAL_SOLUTION | Freq: Four times a day (QID) | ORAL | Status: DC | PRN

## 2020-01-29 MED ORDER — AMOXICILLIN-POT CLAVULANATE 500-125 MG PO TABS
1.0000 | ORAL_TABLET | Freq: Two times a day (BID) | ORAL | Status: AC
  Administered 2020-01-29 – 2020-01-31 (×4): 500 mg via ORAL
  Filled 2020-01-29 (×6): qty 1

## 2020-01-29 MED ORDER — CARVEDILOL 12.5 MG PO TABS: 12.5000 mg | ORAL_TABLET | Freq: Two times a day (BID) | ORAL | 0 refills | Status: AC

## 2020-01-29 MED ORDER — FERROUS SULFATE 325 (65 FE) MG PO TABS
325.0000 mg | ORAL_TABLET | Freq: Every day | ORAL | Status: DC
  Administered 2020-01-30 – 2020-02-11 (×13): 325 mg via ORAL
  Filled 2020-01-29 (×13): qty 1

## 2020-01-29 MED ORDER — FINASTERIDE 5 MG PO TABS
5.0000 mg | ORAL_TABLET | Freq: Every day | ORAL | Status: DC
  Administered 2020-01-30 – 2020-02-11 (×13): 5 mg via ORAL
  Filled 2020-01-29 (×13): qty 1

## 2020-01-29 MED ORDER — PANTOPRAZOLE SODIUM 40 MG PO TBEC
40.0000 mg | DELAYED_RELEASE_TABLET | Freq: Every day | ORAL | Status: DC
  Administered 2020-01-30 – 2020-02-11 (×13): 40 mg via ORAL
  Filled 2020-01-29 (×13): qty 1

## 2020-01-29 MED ORDER — AMOXICILLIN-POT CLAVULANATE 500-125 MG PO TABS
1.0000 | ORAL_TABLET | Freq: Two times a day (BID) | ORAL | 0 refills | Status: DC
Start: 2020-01-29 — End: 2020-02-11

## 2020-01-29 MED ORDER — PROCHLORPERAZINE 25 MG RE SUPP: 12.5000 mg | Freq: Four times a day (QID) | RECTAL | Status: DC | PRN

## 2020-01-29 MED ORDER — CARVEDILOL 12.5 MG PO TABS
12.5000 mg | ORAL_TABLET | Freq: Two times a day (BID) | ORAL | Status: DC
  Administered 2020-01-30 – 2020-02-11 (×25): 12.5 mg via ORAL
  Filled 2020-01-29 (×25): qty 1

## 2020-01-29 MED ORDER — PROCHLORPERAZINE MALEATE 5 MG PO TABS: 5.0000 mg | ORAL_TABLET | Freq: Four times a day (QID) | ORAL | Status: DC | PRN

## 2020-01-29 MED ORDER — AMLODIPINE BESYLATE 10 MG PO TABS
10.0000 mg | ORAL_TABLET | Freq: Every day | ORAL | Status: DC
  Administered 2020-01-30 – 2020-02-11 (×13): 10 mg via ORAL
  Filled 2020-01-29 (×13): qty 1

## 2020-01-29 MED ORDER — PROCHLORPERAZINE EDISYLATE 10 MG/2ML IJ SOLN: 5.0000 mg | Freq: Four times a day (QID) | INTRAMUSCULAR | Status: DC | PRN

## 2020-01-29 MED ORDER — DOXAZOSIN MESYLATE 4 MG PO TABS: 4.0000 mg | ORAL_TABLET | Freq: Two times a day (BID) | ORAL | 0 refills | Status: AC

## 2020-01-29 MED ORDER — ALUM & MAG HYDROXIDE-SIMETH 200-200-20 MG/5ML PO SUSP: 30.0000 mL | ORAL | Status: DC | PRN

## 2020-01-29 MED ORDER — FLUTICASONE PROPIONATE 50 MCG/ACT NA SUSP: 2.0000 | Freq: Every day | NASAL | Status: DC | PRN

## 2020-01-29 MED ORDER — HYDRALAZINE HCL 50 MG PO TABS
75.0000 mg | ORAL_TABLET | Freq: Three times a day (TID) | ORAL | Status: DC
  Administered 2020-01-29: 75 mg via ORAL
  Filled 2020-01-29: qty 1

## 2020-01-29 MED ORDER — POLYETHYLENE GLYCOL 3350 17 G PO PACK
17.0000 g | PACK | Freq: Every day | ORAL | Status: DC | PRN
  Administered 2020-01-30: 17 g via ORAL
  Filled 2020-01-29: qty 1

## 2020-01-29 MED ORDER — APIXABAN 2.5 MG PO TABS
2.5000 mg | ORAL_TABLET | Freq: Two times a day (BID) | ORAL | 0 refills | Status: DC
Start: 2020-01-29 — End: 2020-02-11

## 2020-01-29 MED ORDER — SENNA 8.6 MG PO TABS
2.0000 | ORAL_TABLET | Freq: Every day | ORAL | Status: DC
  Administered 2020-01-30 – 2020-02-10 (×12): 17.2 mg via ORAL
  Filled 2020-01-29 (×13): qty 2

## 2020-01-29 MED ORDER — APIXABAN 2.5 MG PO TABS
2.5000 mg | ORAL_TABLET | Freq: Two times a day (BID) | ORAL | Status: DC
  Administered 2020-01-29 – 2020-02-11 (×26): 2.5 mg via ORAL
  Filled 2020-01-29 (×26): qty 1

## 2020-01-29 MED ORDER — DOXAZOSIN MESYLATE 4 MG PO TABS
4.0000 mg | ORAL_TABLET | Freq: Two times a day (BID) | ORAL | Status: DC
  Administered 2020-01-29 – 2020-02-11 (×26): 4 mg via ORAL
  Filled 2020-01-29 (×28): qty 1

## 2020-01-29 MED ORDER — HYDRALAZINE HCL 25 MG PO TABS: 75.0000 mg | ORAL_TABLET | Freq: Three times a day (TID) | ORAL | Status: DC

## 2020-01-29 MED ORDER — ALBUTEROL SULFATE (2.5 MG/3ML) 0.083% IN NEBU: 3.0000 mL | INHALATION_SOLUTION | Freq: Four times a day (QID) | RESPIRATORY_TRACT | Status: DC | PRN

## 2020-01-29 MED ORDER — ATORVASTATIN CALCIUM 80 MG PO TABS
80.0000 mg | ORAL_TABLET | Freq: Every day | ORAL | Status: DC
  Administered 2020-01-30 – 2020-02-10 (×12): 80 mg via ORAL
  Filled 2020-01-29 (×12): qty 1

## 2020-01-29 NOTE — Discharge Instructions (Signed)
Dear Todd [name], It was a pleasure taking care of you. You were admitted for an aspiration pneumonia, stroke, and A fib. You were started on antibiotics and responded well to them. You are being discharged on this for an additional 1.5 days, please make sure you complete this. 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 You had an episode of A fib when EMS got to your house. You had 2 cardioversion shocks by EMS and had IV medication in the ED which resolved this. Once you are discharged from rehab you should follow up with your Primary Care Provider with in a week to follow up on this hospital admission.   Thank you very much

## 2020-01-29 NOTE — Discharge Summary (Signed)
Physical Medicine and Rehabilitation Admission H&P    Chief Complaint  Patient presents with  . Functional deficits due to stroke    HPI: Todd [name] is an 33 year with history of PAF, T2DM, CKD, GERD, chronic back pain. Gastric polyps s/p endoscopic resection on 01/21/20. He developed hypotenison with dizziness and falls after discharge to home and was noted to have HR in 170-200 range on evaluation by EMS.  He was cardioverted X 2 and found to have blown right pupil. Exam in ED revealed cogwheeling R>L extremities with RLE weakness.  CT head negative. MRI/MRA brain done revealing subtle diffusion abnormality involving ventral left medulla question subtle infarct.  CT chest showed diffuse infiltrates consistent with extensive left-sided pneumonia and noted to have WBC15.6 with hypoxia at admission.   He was treated with Unasyn and transitioned to Augmentin for 10 total days of antibiotic regimen.   Dr. Einar Gip consulted due to elevated cardiac enzymes noted at admission and felt that this was related to cardioversion/A. fib.  Metabolic acidosis improved with hydration and coreg titrated upwards for rate control. Dr. Erlinda Hong felt the stroke was due to small vessel disease in setting of sepsis/septic shock and to resume Eliquis at 2.5 mg twice daily due to CKD.  He did have a drop in his hemoglobin to 6.9 on 10/22 and was transfused with 1 units PRBCs. Blood pressures have been labile and hydralazine titrated upwards.    Review of Systems  Constitutional: Negative for chills and fever.  HENT: Negative for hearing loss.   Eyes: Negative for blurred vision and double vision.  Respiratory: Negative for cough.   Cardiovascular: Positive for leg swelling. Negative for chest pain.  Gastrointestinal: Positive for heartburn. Negative for constipation and diarrhea.  Genitourinary: Negative for dysuria and urgency.  Musculoskeletal: Positive for back pain.  Skin: Negative for rash.  Neurological:  Positive for dizziness (occasionally), tremors (for past year), speech change and focal weakness.  Psychiatric/Behavioral: The patient is nervous/anxious.       Past Medical History:  Diagnosis Date  . Anemia   . Asthma    history of asthma  . Atrial fibrillation ([place])   . Chronic kidney disease   . Diabetes mellitus without complication ([place])    type 2 - no meds  . GERD (gastroesophageal reflux disease)   . History of atrial fibrillation, S/P EP ablation   . HLD (hyperlipidemia)   . Hypertension   . Seasonal allergies   . TIA (transient ischemic attack) 06/15/2017    Past Surgical History:  Procedure Laterality Date  . COLONOSCOPY WITH ESOPHAGOGASTRODUODENOSCOPY (EGD)  04/2019  . ENDOSCOPIC MUCOSAL RESECTION  01/21/2020   Procedure: ENDOSCOPIC MUCOSAL RESECTION;  Surgeon: Rush Landmark Telford Nab., MD;  Location: [place];  Service: Gastroenterology;;  . ESOPHAGOGASTRODUODENOSCOPY N/A 10/17/2014   Procedure: ESOPHAGOGASTRODUODENOSCOPY (EGD);  Surgeon: Wilford Corner, MD;  Location: [facility] ENDOSCOPY;  Service: Endoscopy;  Laterality: N/A;  . ESOPHAGOGASTRODUODENOSCOPY (EGD) WITH PROPOFOL N/A 01/21/2020   Procedure: ESOPHAGOGASTRODUODENOSCOPY (EGD) WITH PROPOFOL;  Surgeon: Rush Landmark Telford Nab., MD;  Location: [place];  Service: Gastroenterology;  Laterality: N/A;  . HEMOSTASIS CLIP PLACEMENT  01/21/2020   Procedure: HEMOSTASIS CLIP PLACEMENT;  Surgeon: Irving Copas., MD;  Location: [place];  Service: Gastroenterology;;  . multiple tooth implants     pt thinks sedation  . SUBMUCOSAL LIFTING INJECTION  01/21/2020   Procedure: SUBMUCOSAL LIFTING INJECTION;  Surgeon: Rush Landmark Telford Nab., MD;  Location: [place];  Service: Gastroenterology;;    Family History  Problem Relation Age of Onset  . Diabetes Mother   . Diabetes Father   . Stroke Father   . Colon cancer Father   . Diabetes Sister   . Stomach cancer Neg Hx   . Pancreatic cancer Neg Hx     . Esophageal cancer Neg Hx   . Inflammatory bowel disease Neg Hx   . Liver disease Neg Hx     Social History:  Married. Independent PTA. 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 He reports that he has never smoked. He has never used smokeless tobacco. He reports that he does not drink alcohol and does not use drugs.   Allergies  Allergen Reactions  . Lisinopril Cough  . Metformin And Related Other (See Comments)    chronic kidney disease   Medications Prior to Admission  Medication Sig Dispense Refill  . albuterol (PROVENTIL HFA;VENTOLIN HFA) 108 (90 Base) MCG/ACT inhaler Inhale 2 puffs into the lungs every 6 (six) hours as needed for wheezing or shortness of breath.    Marland Kitchen amLODipine (NORVASC) 10 MG tablet Take 10 mg by mouth daily.    Marland Kitchen apixaban (ELIQUIS) 5 MG TABS tablet Take 1 tablet (5 mg total) by mouth 2 (two) times daily. 60 tablet 0  . atorvastatin (LIPITOR) 80 MG tablet Take 1 tablet (80 mg total) by mouth daily at 6 PM. 30 tablet 0  . calcitRIOL (ROCALTROL) 0.25 MCG capsule Take 0.25 mcg by mouth daily.    . cholecalciferol (VITAMIN D) 25 MCG (1000 UNIT) tablet Take 1,000 Units by mouth daily.    Marland Kitchen docusate sodium (COLACE) 50 MG capsule Take 100 mg by mouth daily as needed for mild constipation.    Marland Kitchen doxazosin (CARDURA) 8 MG tablet Take 4-8 mg by mouth See admin instructions. Tale 8 mg in the morning and 4 mg at bedtime    . ferrous sulfate 325 (65 FE) MG tablet Take 325 mg by mouth daily.     . finasteride (PROSCAR) 5 MG tablet Take 5 mg by mouth daily.    . fluticasone (FLONASE) 50 MCG/ACT nasal spray Place 2 sprays into both nostrils daily as needed for allergies or rhinitis.    . furosemide (LASIX) 40 MG tablet Take 40 mg by mouth See admin instructions. Take 40 mg by mouth daily in the morning  Take additional 40 mg by mouth at bedtime on Mon., Wed., and friday    . hydrALAZINE (APRESOLINE) 100 MG tablet Take 100 mg by mouth 3 (three) times daily.     Marland Kitchen  lactulose (CHRONULAC) 10 GM/15ML solution Take 10 g by mouth daily as needed for mild constipation or moderate constipation.    Marland Kitchen loratadine (CLARITIN) 10 MG tablet Take 10 mg by mouth at bedtime.     Marland Kitchen losartan (COZAAR) 100 MG tablet Take 100 mg by mouth daily.    Marland Kitchen omeprazole (PRILOSEC) 40 MG capsule Take 1 capsule (40 mg total) by mouth 2 (two) times daily before a meal. 60 capsule 4  . polycarbophil (FIBERCON) 625 MG tablet Take 625 mg by mouth daily.    . sennosides-docusate sodium (SENOKOT-S) 8.6-50 MG tablet Take 1 tablet by mouth 2 (two) times daily as needed for constipation.    . Sodium Phosphates (RA SALINE ENEMA RE) Place 1 application rectally as needed (take every other day as needed for constipation).    . sucralfate (CARAFATE) 1 GM/10ML suspension Take 10 mLs (1 g total) by mouth 2 (two) times daily.  Take once during the day and before bedtime.  Try to no take any other medications 1 hour before or after use of Carafate. 420 mL 1  . vitamin B-12 (CYANOCOBALAMIN) 500 MCG tablet Take 1,000 mcg by mouth daily.    Marland Kitchen NIFEdipine (ADALAT CC) 60 MG 24 hr tablet Take 60 mg by mouth daily. (Patient not taking: Reported on 01/23/2020)      Drug Regimen Review  Drug regimen was reviewed and remains appropriate with no significant issues identified  Home: Home Living Family/patient expects to be discharged to:: Private residence Living Arrangements: Spouse/significant other Available Help at Discharge: Family, Available 24 hours/day Type of Home: House Home Access: Stairs to enter [employer] of Steps: 2 Entrance Stairs-Rails: Can reach both Home Layout: Two level Alternate Level Stairs-Number of Steps: 13 Alternate Level Stairs-Rails: Right, Left (split rail, half left and half right) Bathroom Shower/Tub: Chiropodist: Standard Home Equipment: Environmental consultant - 2 wheels, Cane - single point, Grab bars - toilet, Grab bars - tub/shower  Lives With: Spouse     Functional History: Prior Function Level of Independence: Independent, Independent with assistive device(s) Comments: pt reports use of cane intermittently when outdoors  Functional Status:  Mobility: Bed Mobility Overal bed mobility: Needs Assistance Bed Mobility: Supine to Sit Supine to sit: Mod assist, HOB elevated General bed mobility comments: up in chair with OT upon arrival to room Transfers Overall transfer level: Needs assistance Equipment used: Rolling walker (2 wheeled) Transfers: Sit to/from Stand, [employer] Transfers Sit to Stand: Mod assist Stand pivot transfers: Mod assist General transfer comment: Mod assist for power up, hip extension with posterior trunk tactile facilitation, and steadying upon standing. sit<>stand x4 throughout session, with stand pivot to and from transport chair each time. Ambulation/Gait Ambulation/Gait assistance: Min assist Gait Distance (Feet): 90 Feet (+70) Assistive device: Rolling walker (2 wheeled) Gait Pattern/deviations: Step-through pattern, Decreased stride length, Shuffle, Drifts right/left General Gait Details: Min assist to steady, avoid obstacles to pt L. Poor attention to L and almost running into NP in hallway. PT asks "did you see her", pt states "no I didn't". Gait velocity: decr Gait velocity interpretation: <1.8 ft/sec, indicate of risk for recurrent falls    ADL: ADL Overall ADL's : Needs assistance/impaired Eating/Feeding: Set up, Sitting Grooming: Minimal assistance, Sitting Upper Body Bathing: Minimal assistance, Sitting Lower Body Bathing: Moderate assistance, Sit to/from stand Upper Body Dressing : Minimal assistance, Sitting Upper Body Dressing Details (indicate cue type and reason): to don gown as back side cover Lower Body Dressing: Min guard Lower Body Dressing Details (indicate cue type and reason): to adjust socks from EOB with minguard when reaching out of BOS Toilet Transfer: Minimal assistance, RW,  Stand-pivot, Cueing for sequencing, Cueing for safety, Maximal assistance, Moderate assistance Toilet Transfer Details (indicate cue type and reason): MAX A +1 to intially power up into standing from EOB, MOD A +1 for stand pivot transfer to recliner, cues for sequencing of task and body mechanics in relation to RW mgmt Toileting- Clothing Manipulation and Hygiene: Moderate assistance, Sit to/from stand Tub/ Shower Transfer: Tub transfer, Moderate assistance, Ambulation, Rolling walker, 3 in 1 Functional mobility during ADLs: Minimal assistance, Moderate assistance, Rolling walker, Cueing for safety, Cueing for sequencing General ADL Comments: pt continues to present with generalized weakness RUE>LUE, decreased activity tolerance and impaired balance impacting pts ability to complete BADLs  Cognition: Cognition Overall Cognitive Status: Within Functional Limits for tasks assessed Arousal/Alertness: Awake/alert Orientation Level: Oriented X4 Attention: Focused,  Sustained Focused Attention: Appears intact Sustained Attention: Appears intact Memory: Impaired Memory Impairment: Retrieval deficit, Decreased recall of new information (Immediate: 3/3; delayed: 2/3; with cues: 1/1) Awareness: Appears intact Problem Solving: Appears intact Executive Function: Reasoning Reasoning: Impaired Reasoning Impairment: Verbal complex Cognition Arousal/Alertness: Awake/alert Behavior During Therapy: WFL for tasks assessed/performed, Flat affect (slightly flat, however affect improved when pt got to see his wife) Overall Cognitive Status: Within Functional Limits for tasks assessed General Comments: pt disoriented to day of week, date and year but able to state location and month. pt fixated on getting to see wife. collaborated with PT to transport pt to see wife.   Blood pressure (!) 152/89, pulse 64, temperature 98.3 F (36.8 C), temperature source Axillary, resp. rate 20, height 6\' 1"  (1.854 m), weight  104.2 kg, SpO2 99 %. Physical Exam Nursing note reviewed.  Constitutional:      Appearance: Normal appearance.  HENT:     Head: Normocephalic.     Nose: Nose normal.  Cardiovascular:     Rate and Rhythm: Normal rate.  Pulmonary:     Effort: Pulmonary effort is normal.  Neurological:     Mental Status: He is alert and oriented to person, place, and time.     Comments: Disoriented today--was sleeping soundly prior to aroused for exam and was very slow to orient to surrounding.  Mild dysarthria noted. Intentional tremors noted BUE. Moves all 4's but MMT difficult d/t inconsistent participation. Senses pain in all 4's.   Psychiatric:     Comments: flat    Results for orders placed or performed during the hospital encounter of 01/21/20 (from the past 48 hour(s))  CBC with Differential/Platelet     Status: Abnormal   Collection Time: 01/28/20  3:46 AM  Result Value Ref Range   WBC 6.7 4.0 - 10.5 K/uL   RBC 2.99 (L) 4.22 - 5.81 MIL/uL   Hemoglobin 7.7 (L) 13.0 - 17.0 g/dL   HCT 24.8 (L) 39 - 52 %   MCV 82.9 80.0 - 100.0 fL   MCH 25.8 (L) 26.0 - 34.0 pg   MCHC 31.0 30.0 - 36.0 g/dL   RDW 13.5 11.5 - 15.5 %   Platelets 195 150 - 400 K/uL   nRBC 0.0 0.0 - 0.2 %   Neutrophils Relative % 67 %   Neutro Abs 4.5 1.7 - 7.7 K/uL   Lymphocytes Relative 13 %   Lymphs Abs 0.9 0.7 - 4.0 K/uL   Monocytes Relative 13 %   Monocytes Absolute 0.9 0.1 - 1.0 K/uL   Eosinophils Relative 5 %   Eosinophils Absolute 0.3 0.0 - 0.5 K/uL   Basophils Relative 1 %   Basophils Absolute 0.0 0.0 - 0.1 K/uL   Immature Granulocytes 1 %   Abs Immature Granulocytes 0.04 0.00 - 0.07 K/uL    Comment: Performed at [facility], [address], [place], [place] [address]  Basic metabolic panel     Status: Abnormal   Collection Time: 01/28/20  3:46 AM  Result Value Ref Range   Sodium 143 135 - 145 mmol/L   Potassium 4.4 3.5 - 5.1 mmol/L   Chloride 112 (H) 98 - 111 mmol/L   CO2 23 22 - 32 mmol/L   Glucose,  Bld 120 (H) 70 - 99 mg/dL    Comment: Glucose reference range applies only to samples taken after fasting for at least 8 hours.   BUN 45 (H) 8 - 23 mg/dL   Creatinine, Ser 2.51 (  H) 0.61 - 1.24 mg/dL   Calcium 8.3 (L) 8.9 - 10.3 mg/dL   GFR, Estimated 25 (L) >60 mL/min    Comment: (NOTE) Calculated using the CKD-EPI Creatinine Equation (2021)    Anion gap 8 5 - 15    Comment: Performed at [facility] [address], [place], [place] [address]  Glucose, capillary     Status: Abnormal   Collection Time: 01/29/20  5:40 AM  Result Value Ref Range   Glucose-Capillary 136 (H) 70 - 99 mg/dL    Comment: Glucose reference range applies only to samples taken after fasting for at least 8 hours.  Basic metabolic panel     Status: Abnormal   Collection Time: 01/29/20  9:31 AM  Result Value Ref Range   Sodium 140 135 - 145 mmol/L   Potassium 4.3 3.5 - 5.1 mmol/L   Chloride 109 98 - 111 mmol/L   CO2 22 22 - 32 mmol/L   Glucose, Bld 190 (H) 70 - 99 mg/dL    Comment: Glucose reference range applies only to samples taken after fasting for at least 8 hours.   BUN 48 (H) 8 - 23 mg/dL   Creatinine, Ser 2.64 (H) 0.61 - 1.24 mg/dL   Calcium 8.3 (L) 8.9 - 10.3 mg/dL   GFR, Estimated 24 (L) >60 mL/min    Comment: (NOTE) Calculated using the CKD-EPI Creatinine Equation (2021)    Anion gap 9 5 - 15    Comment: Performed at [facility] [address], [place], [place] [address]  CBC     Status: Abnormal   Collection Time: 01/29/20  9:31 AM  Result Value Ref Range   WBC 7.4 4.0 - 10.5 K/uL   RBC 3.03 (L) 4.22 - 5.81 MIL/uL   Hemoglobin 7.9 (L) 13.0 - 17.0 g/dL   HCT 25.2 (L) 39 - 52 %   MCV 83.2 80.0 - 100.0 fL   MCH 26.1 26.0 - 34.0 pg   MCHC 31.3 30.0 - 36.0 g/dL   RDW 13.4 11.5 - 15.5 %   Platelets 211 150 - 400 K/uL   nRBC 0.0 0.0 - 0.2 %    Comment: Performed at [facility], [place] [address] [place], [place] [address]   No results found.     Medical Problem List and  Plan: 1.  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   -patient may shower  -ELOS/Goals: 9-12 days, supervision to mod I with PT/OT 2.  Antithrombotics: -DVT/anticoagulation:  Pharmaceutical: Other (comment)--Eliquis  -antiplatelet therapy: N/A 3. Pain Management: tylenol prn for chronic LBP 4. Mood: LCSW to follow for evaluation and support.   -antipsychotic agents: N/A 5. Neuropsych: This patient is intermittently capable of making decisions on his own behalf. 6. Skin/Wound Care: Routine pressure relief measures.  7. Fluids/Electrolytes/Nutrition: Monitor I/O. Check lytes in am.  8. Aspiration PNA?: On Augmentin D # 8/10. 9. HTN: Monitor BP tid--continue Norvasc, coreg bid, Cardura bid, Proscar and Hydralazine tid.  10. GERD/gastric polyps: On Protonix. 11. PAF: Monitor HR tid --on coreg and Eliquis. 12.Acute on chronic anemia: Will continue to monitor H/H. Recheck CBC in am. Continue iron supplement.        Bary Leriche, PA-C 01/29/2020

## 2020-01-29 NOTE — Progress Notes (Signed)
Occupational Therapy Treatment Patient Details Name: Todd [name] MRN: [id number] DOB: 10/15/1940 Today's Date: 01/29/2020    History of present illness 79 y.o. male presenting with altered mental status and blown pupils shortly after EGD concerning for acute CVA and acute hypoxemic respiratory failure likely secondary to pneumonia. PMH is significant for paroxysmal A Fib (on apixaban), TIA, T2DM, HTN, HLD, CKD, GERD, iron deficiency anemia. CT head without contrast without evidence of ICH. MRI of the brain did reveal potential subtle acute ischemic infarct in the ventral left medulla versus artifact.   OT comments  Pt making steady progress towards OT goals this session. Pt continues to present with impaired balance, generalized weakness and decreased activity tolerance impacting pts ability to complete BADLs. Pt very flat at start of session, upset that he hasn't been able to see his wife who is also admitted. Collaborated with PT and dovetailed our session to transport pt to see his wife. Overall, pt requires most assist to initially power up into standing needing MAX A +1 but progressing MINA  +2 as session and repetitions progressed. Pt able to complete household distance functional mobility with MIN A +2 for safety with RW. Pt currently requires MIN A for UB ADLs and min guard for LB ADLs from EOB.  Pt would continue to benefit from skilled occupational therapy while admitted and after d/c to address the below listed limitations in order to improve overall functional mobility and facilitate independence with BADL participation. DC plan remains appropriate, will follow acutely per POC.     Follow Up Recommendations  CIR    Equipment Recommendations  Other (comment) (defer to next venue)    Recommendations for Other Services      Precautions / Restrictions Precautions Precautions: Fall Restrictions Weight Bearing Restrictions: No       Mobility Bed Mobility Overal bed mobility:  Needs Assistance Bed Mobility: Supine to Sit     Supine to sit: Mod assist;HOB elevated     General bed mobility comments: pt required physical assist to advance BLEs to EOB, pt required MOD A to elevate trunk into sitting and use of bed pads to scoot hips to EOB  Transfers Overall transfer level: Needs assistance Equipment used: Rolling walker (2 wheeled) Transfers: Sit to/from [facility] Sit to Stand:  (MAX A +1; MOD A +2) Stand pivot transfers: Mod assist       General transfer comment: Pt required MAX A +1 to power up from EOB needing increased assist to shift weight anteriorly to power up and extend hips, however was MOD A with +2 assist progressing to MIN A +2 with increased reps. pt able to pivot to recliner with MOD A +1 needing assist for balance and RW mgmt    Balance Overall balance assessment: Needs assistance Sitting-balance support: No upper extremity supported;Feet supported Sitting balance-Leahy Scale: Good Sitting balance - Comments: supervision   Standing balance support: Bilateral upper extremity supported;During functional activity Standing balance-Leahy Scale: Poor Standing balance comment: reliant on UE support of RW and minG                           ADL either performed or assessed with clinical judgement   ADL Overall ADL's : Needs assistance/impaired                 Upper Body Dressing : Minimal assistance;Sitting Upper Body Dressing Details (indicate cue type and reason): to don gown as back side cover  Lower Body Dressing: Min guard Lower Body Dressing Details (indicate cue type and reason): to adjust socks from EOB with minguard when reaching out of BOS Toilet Transfer: Minimal assistance;RW;Stand-pivot;Cueing for sequencing;Cueing for safety;Maximal assistance;Moderate assistance Toilet Transfer Details (indicate cue type and reason): MAX A +1 to intially power up into standing from EOB, MOD A +1 for stand pivot  transfer to recliner, cues for sequencing of task and body mechanics in relation to RW mgmt         Functional mobility during ADLs: Minimal assistance;Moderate assistance;Rolling walker;Cueing for safety;Cueing for sequencing General ADL Comments: pt continues to present with generalized weakness RUE>LUE, decreased activity tolerance and impaired balance impacting pts ability to complete BADLs     Vision       Perception     Praxis      Cognition Arousal/Alertness: Awake/alert Behavior During Therapy: WFL for tasks assessed/performed;Flat affect (slightly flat, however affect improved when pt got to see his wife) Overall Cognitive Status: Within Functional Limits for tasks assessed                                 General Comments: pt disoriented to day of week, date and year but able to state location and month. pt fixated on getting to see wife. collaborated with PT to transport pt to see wife.        Exercises Other Exercises Other Exercises: decreased AROM in RUE ~ 90 degrees sh flexion   Shoulder Instructions       General Comments noted increased edema in RUE this session    Pertinent Vitals/ Pain       Pain Assessment: No/denies pain  Home Living                                          Prior Functioning/Environment              Frequency  Min 2X/week        Progress Toward Goals  OT Goals(current goals can now be found in the care plan section)  Progress towards OT goals: Progressing toward goals  Acute Rehab OT Goals Patient Stated Goal: To return to independence OT Goal Formulation: With patient Time For Goal Achievement: 02/07/20 Potential to Achieve Goals: [place] Discharge plan remains appropriate;Frequency remains appropriate    Co-evaluation                 AM-PAC OT "6 Clicks" Daily Activity     Outcome Measure   Help from another person eating meals?: A Little Help from another person  taking care of personal grooming?: A Little Help from another person toileting, which includes using toliet, bedpan, or urinal?: A Lot Help from another person bathing (including washing, rinsing, drying)?: A Lot Help from another person to put on and taking off regular upper body clothing?: A Little Help from another person to put on and taking off regular lower body clothing?: A Little 6 Click Score: 16    End of Session Equipment Utilized During Treatment: Gait belt;Rolling walker  OT Visit Diagnosis: Unsteadiness on feet (R26.81);Muscle weakness (generalized) (M62.81);Hemiplegia and hemiparesis;Other symptoms and signs involving cognitive function Hemiplegia - Right/Left: Right   Activity Tolerance Patient tolerated treatment well   Patient Left Other (comment) (pt handed off to PT to be transported back  to room after seeing wife)   Nurse Communication Mobility status        Time: 0607-8950 OT Time Calculation (min): 48 min  Charges: OT General Charges $OT Visit: 1 Visit OT Treatments $Therapeutic Activity: 38-52 mins  Lanier Clam., COTA/L Acute Rehabilitation Services [phone]    Ihor Gully 01/29/2020, 11:57 AM

## 2020-01-29 NOTE — Progress Notes (Signed)
FPTS Interim Progress Note   Had Peer-to-Peer with [employer] over CIR placement. Discussed patients return to baseline Hgb post transfusion and Neurology final confirmation of stroke. Discussed PT/OT recommendations of CIR and amount of assistance needed during sessions. Insurance will overturn denial and approve it.   Briant Cedar, MD 01/29/2020, 1:06 PM PGY-1, [facility] pager [phone]

## 2020-01-29 NOTE — TOC Transition Note (Signed)
Transition of Care [facility]) - CM/SW Discharge Note   Patient Details  Name: Todd [name] MRN: [id number] Date of Birth: 18-Jun-1940  Transition of Care [facility]) CM/SW Contact:  Pollie Friar, RN Phone Number: 01/29/2020, 3:49 PM   Clinical Narrative:    Pt discharging to CIR today. CM signing off.   Final next level of care: IP Rehab Facility Barriers to Discharge: No Barriers Identified   Patient Goals and CMS Choice        Discharge Placement                       Discharge Plan and Services                                     Social Determinants of Health (SDOH) Interventions     Readmission Risk Interventions No flowsheet data found.

## 2020-01-29 NOTE — Progress Notes (Signed)
Patient arrived on the unit at 1845 in wheelchair. A&O x 4, denied pain or discomfort at the time. Assessment done vitals stable. Educated on POC and safety plan. We continue to monitor.

## 2020-01-29 NOTE — Progress Notes (Signed)
Inpatient Rehab Admissions Coordinator:     A bed has become available on CIR today and I will be able to offer to patient.  Dr. Kai Levins in agreement.  Will let pt/family and TOC team know.   Shann Medal, PT, DPT Admissions Coordinator [phone] 01/29/20  3:31 PM

## 2020-01-29 NOTE — Progress Notes (Signed)
Inpatient Rehab Admissions Coordinator:   Notified by Dr. Kai Levins that peer to peer was successful and insurance will approve CIR. I do not have a bed available for this pt to admit today.  Will follow for possible admission in the next 1-2 days pending bed availability.   Shann Medal, PT, DPT Admissions Coordinator [phone] 01/29/20  2:18 PM

## 2020-01-29 NOTE — Progress Notes (Signed)
Family Medicine Teaching Service Daily Progress Note Intern Pager: [phone]  Patient name: Todd [name] Medical record number: [id number] Date of birth: [birth year] Age: 79 y.o. Gender: male  Primary Care Provider: Charlsie Merles, MD Consultants: Neurology, Cardiology Code Status: Full  Pt Overview and Major Events to Date:  10/18 Admitted 10/22 1 Unit pRBC transfused  Assessment and Plan:   Todd [name] a 79 y.o.malepresenting with altered mental status and blown pupils shortly after EGD concerning for acute CVA and acute hypoxemic respiratory failure likely secondary to pneumonia. PMH is significant forparoxysmal A Fib (on apixaban), TIA, T2DM, HTN, HLD, CKD, GERD, iron deficiency anemia.   Acute hypoxemic respiratory failure possible aspiration pneumonia Sepsis ruled out, improving  Blood cultures no growth at 4 days. S/p Amp-Sulbactam (10/18-10/20), Augmentin (10/20-10/27). Remains afebrile, no chest pain, no SOB, continues to be on Room Air. -ContinueAugmentin 500-125 mg BID (Day7of 8) -Continue cardiac monitoringand continuous pulse oximetry -Am CBC, CMP -Monitor fever curvewill obtainnewblood cultures -Pending discharge to CIR   Left medullaryCVA-stable CT Head -No acute intracranial abnormality.MRI head -Subtle 4 mm focus of diffusion abnormality involving the ventral left medulla. No associated hemorrhage or mass effect. -Appreciate Neurologyrecommendations -Neurologysigned off on 10/20 -Continue Eliquis 2.5mg  BID -Normotensive BP goals per neurology  -HoldASA 81mg  per cardiology   AFib with RVR, resolved  Heart rate is stable. -ContinueEliquis2.5BID -ContinueCarvedilol to 12.5mg  BID -Cardiac monitoring   Normocyticanemia, treating  S/p 1 uRBC. Hgb this morning is 7.9. Home meds: ferrous sulfate 325 mg daily, vitamin B12 1000 mcg daily. -ContinueFerrousSulphate 325mg  daily -Daily CBC   Anxiety: Anxiety  is increased today due patient's wife being hospitalized yesterday. Will receive gabapentin today. -Continue Gabapentin 100 mg PRN daily   HTN,treating BP in the 536'U Systolic. Home meds:amlodipine 10 mg, doxazosin (8 mg AM, 4 mg bedtime), hydralazine 100 mg TID, losartan 100 mg. Have clarified with patient that he once took Nifedipine but stopped, he is taking all others.He continues to have higher blood pressures so will increase Hydralazine to 75 mg TID. -Continue to monitor BP -IncreaseHydralazineto 75 mg TID   -ContinueCarvedilol to 12.5mg  BID -Continue Amlodipine 10 mg -BP goal-normotension per neurology   Hypokalemia, (resolved) K this morning is4.3 -Monitor with daily BMP -Replete as necessary   Anion gap metabolic acidosis, resolved On admission elevated lactate 5.5.with anion gap metabolic acidosis. 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 Home medication is Furosemide 40 mg MWF.  -Holding furosemide (permissive HTN) -strict I/O -Daily weights   HLD Home meds: atorvastatin 80 mg. -Atorvastatin 80mg  once daily   Asthma Home meds: albuterol prn -Continue albuterol prn   Diabetes A1C is 6.1on admission.CBGs 120 overnight. Not on any antihyperglycemic agents. -CBGs Q4H -Start sSSI if needed   AKI onCKD, improving Cr2.64.Baseline appears to be around 2.6-2.7. Follows with nephrology at the [facility] in [place]. -Monitorwith BMP -Avoid nephrotoxic agents    GERD Home meds: omeprazole 40 mg BID, sucralfate 1 g BID. -Continuepantoprazole40mg per formulary   BPH Home meds: doxazosin (8 mg AM, 4 mg PM), finasteride 5 mg. -Continue Finasteride 5 mg daily -Continue Doxazosin at 4 mg BID   FEN/GI: Heart Healthy PPx: Eliquis  Disposition: Tele Med, medically stable for discharge to CIR  Prior to Admission Living Arrangement: Home Anticipated Discharge Location: CIR Barriers to Discharge: CIR  placement Anticipated discharge in approximately 1-3 day(s).   Subjective:  Interviewed patient at bedside.  He reports doing well today. He reports being a little more anxious today due  to his wife being hospitalized yesterday. Discussed that the order is in for him to with assistance visit his wife. He reports no Chest pain, SOB. Has no other complaints.   Objective: Temp:  [97.7 F (36.5 C)-98.3 F (36.8 C)] 98.3 F (36.8 C) (10/26 0409) Pulse Rate:  [59-66] 62 (10/26 0409) Resp:  [17-20] 20 (10/26 0409) BP: (145-178)/(65-95) 163/75 (10/26 0409) SpO2:  [97 %-100 %] 100 % (10/26 0409) Weight:  [229 lb 11.5 oz (104.2 kg)] 229 lb 11.5 oz (104.2 kg) (10/26 0500) Physical Exam:  Physical Exam Vitals and nursing note reviewed.  Constitutional:      General: He is not in acute distress.    Appearance: Normal appearance. He is not ill-appearing, toxic-appearing or diaphoretic.  HENT:     Head: Normocephalic and atraumatic.  Cardiovascular:     Rate and Rhythm: Normal rate and regular rhythm.     Pulses: Normal pulses.          Radial pulses are 2+ on the right side and 2+ on the left side.       Dorsalis pedis pulses are 2+ on the right side and 2+ on the left side.     Heart sounds: Normal heart sounds, S1 normal and S2 normal. No murmur heard.   Pulmonary:     Effort: Pulmonary effort is normal. No respiratory distress.     Breath sounds: Normal breath sounds. No wheezing.  Abdominal:     General: There is no distension.     Palpations: There is no mass.     Tenderness: There is no abdominal tenderness.  Musculoskeletal:     Right lower leg: No edema.     Left lower leg: No edema.  Neurological:     Mental Status: He is alert.      Laboratory: Recent Labs  Lab 01/26/20 0200 01/27/20 0152 01/28/20 0346  WBC 9.5 8.4 6.7  HGB 8.4* 8.0* 7.7*  HCT 26.2* 25.4* 24.8*  PLT 188 193 195   Recent Labs  Lab 01/26/20 0200 01/27/20 0152 01/28/20 0346  NA 142 142 143   K 4.3 4.1 4.4  CL 112* 110 112*  CO2 22 24 23   BUN 45* 42* 45*  CREATININE 2.57* 2.54* 2.51*  CALCIUM 8.6* 8.3* 8.3*  GLUCOSE 138* 134* 120*     Imaging/Diagnostic Tests:  Echo: EF 55-60%. No wall motion abnormalities andGrade III diastolic dysfunction. Left atrium severley dilated. Right atrium mildly dilated. All other structures examined grossly normal.   VAS US Carotid:Right Carotid: Velocities in the right ICA are consistent with a 1-39%  stenosis.  Left Carotid: Velocities in the left ICA are consistent with a 1-39%  stenosis.  Vertebrals: Bilateral vertebral arteries demonstrate antegrade flow.   MR Angio Head WO Contrast:Mildly motion degraded exam. Negative intracranial MRA for large vessel occlusion. No hemodynamically significant or correctable stenosis.   MR Brain WO Contrast:Mildly motion degraded exam. Negative intracranial MRA for large vessel occlusion. No hemodynamically significant or correctable stenosis.   CT Chest WO Contrast:Diffuse left-sided pneumonia. Cholelithiasis without complicating factors.   DG Chest Portable 1 View:Marked severity left-sided infiltrate.   CT Head Code Stroke WO Contrast:No acute intracranial abnormality. ASPECTS is 10.Motion degraded study.   Briant Cedar, MD 01/29/2020, 5:50 AM PGY-1, [place] Intern pager: [phone], text pages welcome

## 2020-01-29 NOTE — Progress Notes (Signed)
Physical Therapy Treatment Patient Details Name: Todd [name] MRN: [id number] DOB: November 04, 1940 Today's Date: 01/29/2020    History of Present Illness 79 y.o. male presenting with altered mental status and blown pupils shortly after EGD concerning for acute CVA and acute hypoxemic respiratory failure likely secondary to pneumonia. PMH is significant for paroxysmal A Fib (on apixaban), TIA, T2DM, HTN, HLD, CKD, GERD, iron deficiency anemia. CT head without contrast without evidence of ICH. MRI of the brain did reveal potential subtle acute ischemic infarct in the ventral left medulla versus artifact.    PT Comments    Pt with goal of going to visit his wife today, as she is also hospitalized. In order to accomplish this goal, pt transferred x4 times requiring min-mod assist to perform with max cuing for sequencing stand pivot transfer task. Pt with increased assist for short distance gait today, requiring min assist to steady and avoid L obstacles. Pt with less attention to L this session vs previous. PT continuing to recommend CIR post-acutely to maximize pt functional mobility, will continue to follow acutely.    Follow Up Recommendations  CIR     Equipment Recommendations  None recommended by PT    Recommendations for Other Services       Precautions / Restrictions Precautions Precautions: Fall Restrictions Weight Bearing Restrictions: No    Mobility  Bed Mobility Overal bed mobility: Needs Assistance Bed Mobility: Supine to Sit     Supine to sit: Mod assist;HOB elevated     General bed mobility comments: up in chair with OT upon arrival to room  Transfers Overall transfer level: Needs assistance Equipment used: Rolling walker (2 wheeled) Transfers: Sit to/from [facility] Sit to Stand: Mod assist Stand pivot transfers: Mod assist       General transfer comment: Mod assist for power up, hip extension with posterior trunk tactile facilitation, and  steadying upon standing. sit<>stand x4 throughout session, with stand pivot to and from transport chair each time.  Ambulation/Gait Ambulation/Gait assistance: Min assist Gait Distance (Feet): 90 Feet (+70) Assistive device: Rolling walker (2 wheeled) Gait Pattern/deviations: Step-through pattern;Decreased stride length;Shuffle;Drifts right/left Gait velocity: decr   General Gait Details: Min assist to steady, avoid obstacles to pt L. Poor attention to L and almost running into NP in hallway. PT asks "did you see her", pt states "no I didn't".   Stairs             Wheelchair Mobility    Modified Rankin (Stroke Patients Only) Modified Rankin (Stroke Patients Only) Pre-Morbid Rankin Score: No significant disability Modified Rankin: Moderately severe disability     Balance Overall balance assessment: Needs assistance Sitting-balance support: No upper extremity supported;Feet supported Sitting balance-Leahy Scale: Fair Sitting balance - Comments: supervision   Standing balance support: Bilateral upper extremity supported;During functional activity Standing balance-Leahy Scale: Poor Standing balance comment: reliant on UE support of RW and minG                            Cognition Arousal/Alertness: Awake/alert Behavior During Therapy: WFL for tasks assessed/performed;Flat affect (slightly flat, however affect improved when pt got to see his wife) Overall Cognitive Status: Within Functional Limits for tasks assessed                                 General Comments: pt disoriented to day of week, date and year but  able to state location and month. pt fixated on getting to see wife. collaborated with PT to transport pt to see wife.      Exercises Other Exercises Other Exercises: decreased AROM in RUE ~ 90 degrees sh flexion    General Comments General comments (skin integrity, edema, etc.): RUE edema noted, requires cues to grip RW       Pertinent Vitals/Pain Pain Assessment: No/denies pain    Home Living                      Prior Function            PT Goals (current goals can now be found in the care plan section) Acute Rehab PT Goals Patient Stated Goal: To return to independence PT Goal Formulation: With patient Time For Goal Achievement: 02/06/20 Potential to Achieve Goals: Good Progress towards PT goals: Progressing toward goals    Frequency    Min 4X/week      PT Plan Current plan remains appropriate    Co-evaluation              AM-PAC PT "6 Clicks" Mobility   Outcome Measure  Help needed turning from your back to your side while in a flat bed without using bedrails?: A Little Help needed moving from lying on your back to sitting on the side of a flat bed without using bedrails?: A Little Help needed moving to and from a bed to a chair (including a wheelchair)?: A Lot Help needed standing up from a chair using your arms (e.g., wheelchair or bedside chair)?: A Little Help needed to walk in hospital room?: A Little Help needed climbing 3-5 steps with a railing? : A Lot 6 Click Score: 16    End of Session Equipment Utilized During Treatment: Gait belt Activity Tolerance: Patient tolerated treatment well Patient left: in chair;with call bell/phone within reach;with chair alarm set Nurse Communication: Mobility status PT Visit Diagnosis: Other abnormalities of gait and mobility (R26.89);Muscle weakness (generalized) (M62.81);Other symptoms and signs involving the nervous system (R29.898)     Time: 1111-1131 (OT overlap - PT did not charge for this time); 5631-4970  PT Time Calculation (min) (ACUTE ONLY): 40 min  Charges:  $Therapeutic Activity: 8-22 mins                     Angles Trevizo E, PT Acute Rehabilitation Services Pager [phone]  Office [phone]     Whitman Meinhardt D Kazuko Clemence 01/29/2020, 1:18 PM

## 2020-01-29 NOTE — H&P (Signed)
Physical Medicine and Rehabilitation Admission H&P        Chief Complaint  Patient presents with  . Functional deficits due to stroke      HPI: Todd [name] is an 60 year with history of PAF, T2DM, CKD, GERD, chronic back pain. Gastric polyps s/p endoscopic resection on 01/21/20. He developed hypotenison with dizziness and falls after discharge to home and was noted to have HR in 170-200 range on evaluation by EMS.  He was cardioverted X 2 and found to have blown right pupil. Exam in ED revealed cogwheeling R>L extremities with RLE weakness.  CT head negative. MRI/MRA brain done revealing subtle diffusion abnormality involving ventral left medulla question subtle infarct.  CT chest showed diffuse infiltrates consistent with extensive left-sided pneumonia and noted to have WBC15.6 with hypoxia at admission.   He was treated with Unasyn and transitioned to Augmentin for 10 total days of antibiotic regimen.    Dr. Einar Gip consulted due to elevated cardiac enzymes noted at admission and felt that this was related to cardioversion/A. fib.  Metabolic acidosis improved with hydration and coreg titrated upwards for rate control. Dr. Erlinda Hong felt the stroke was due to small vessel disease in setting of sepsis/septic shock and to resume Eliquis at 2.5 mg twice daily due to CKD.  He did have a drop in his hemoglobin to 6.9 on 10/22 and was transfused with 1 units PRBCs. Blood pressures have been labile and hydralazine titrated upwards.      Review of Systems  Constitutional: Negative for chills and fever.  HENT: Negative for hearing loss.   Eyes: Negative for blurred vision and double vision.  Respiratory: Negative for cough.   Cardiovascular: Positive for leg swelling. Negative for chest pain.  Gastrointestinal: Positive for heartburn. Negative for constipation and diarrhea.  Genitourinary: Negative for dysuria and urgency.  Musculoskeletal: Positive for back pain.  Skin: Negative for rash.    Neurological: Positive for dizziness (occasionally), tremors (for past year), speech change and focal weakness.  Psychiatric/Behavioral: The patient is nervous/anxious.         Past Medical History:  Diagnosis Date  . Anemia    . Asthma      history of asthma  . Atrial fibrillation ([place])    . Chronic kidney disease    . Diabetes mellitus without complication ([place])      type 2 - no meds  . GERD (gastroesophageal reflux disease)    . History of atrial fibrillation, S/P EP ablation    . HLD (hyperlipidemia)    . Hypertension    . Seasonal allergies    . TIA (transient ischemic attack) 06/15/2017           Past Surgical History:  Procedure Laterality Date  . COLONOSCOPY WITH ESOPHAGOGASTRODUODENOSCOPY (EGD)   04/2019  . ENDOSCOPIC MUCOSAL RESECTION   01/21/2020    Procedure: ENDOSCOPIC MUCOSAL RESECTION;  Surgeon: Rush Landmark Telford Nab., MD;  Location: [facility];  Service: Gastroenterology;;  . ESOPHAGOGASTRODUODENOSCOPY N/A 10/17/2014    Procedure: ESOPHAGOGASTRODUODENOSCOPY (EGD);  Surgeon: Wilford Corner, MD;  Location: [facility] ENDOSCOPY;  Service: Endoscopy;  Laterality: N/A;  . ESOPHAGOGASTRODUODENOSCOPY (EGD) WITH PROPOFOL N/A 01/21/2020    Procedure: ESOPHAGOGASTRODUODENOSCOPY (EGD) WITH PROPOFOL;  Surgeon: Rush Landmark Telford Nab., MD;  Location: [place];  Service: Gastroenterology;  Laterality: N/A;  . HEMOSTASIS CLIP PLACEMENT   01/21/2020    Procedure: HEMOSTASIS CLIP PLACEMENT;  Surgeon: Irving Copas., MD;  Location: [facility];  Service: Gastroenterology;;  . multiple  tooth implants        pt thinks sedation  . SUBMUCOSAL LIFTING INJECTION   01/21/2020    Procedure: SUBMUCOSAL LIFTING INJECTION;  Surgeon: Rush Landmark Telford Nab., MD;  Location: [facility] ENDOSCOPY;  Service: Gastroenterology;;           Family History  Problem Relation Age of Onset  . Diabetes Mother    . Diabetes Father    . Stroke Father    . Colon cancer Father    . Diabetes  Sister    . Stomach cancer Neg Hx    . Pancreatic cancer Neg Hx    . Esophageal cancer Neg Hx    . Inflammatory bowel disease Neg Hx    . Liver disease Neg Hx        Social History:  Married. Independent PTA. 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 He reports that he has never smoked. He has never used smokeless tobacco. He reports that he does not drink alcohol and does not use drugs.          Allergies  Allergen Reactions  . Lisinopril Cough  . Metformin And Related Other (See Comments)      chronic kidney disease          Medications Prior to Admission  Medication Sig Dispense Refill  . albuterol (PROVENTIL HFA;VENTOLIN HFA) 108 (90 Base) MCG/ACT inhaler Inhale 2 puffs into the lungs every 6 (six) hours as needed for wheezing or shortness of breath.      Marland Kitchen amLODipine (NORVASC) 10 MG tablet Take 10 mg by mouth daily.      Marland Kitchen apixaban (ELIQUIS) 5 MG TABS tablet Take 1 tablet (5 mg total) by mouth 2 (two) times daily. 60 tablet 0  . atorvastatin (LIPITOR) 80 MG tablet Take 1 tablet (80 mg total) by mouth daily at 6 PM. 30 tablet 0  . calcitRIOL (ROCALTROL) 0.25 MCG capsule Take 0.25 mcg by mouth daily.      . cholecalciferol (VITAMIN D) 25 MCG (1000 UNIT) tablet Take 1,000 Units by mouth daily.      Marland Kitchen docusate sodium (COLACE) 50 MG capsule Take 100 mg by mouth daily as needed for mild constipation.      Marland Kitchen doxazosin (CARDURA) 8 MG tablet Take 4-8 mg by mouth See admin instructions. Tale 8 mg in the morning and 4 mg at bedtime      . ferrous sulfate 325 (65 FE) MG tablet Take 325 mg by mouth daily.       . finasteride (PROSCAR) 5 MG tablet Take 5 mg by mouth daily.      . fluticasone (FLONASE) 50 MCG/ACT nasal spray Place 2 sprays into both nostrils daily as needed for allergies or rhinitis.      . furosemide (LASIX) 40 MG tablet Take 40 mg by mouth See admin instructions. Take 40 mg by mouth daily in the morning  Take additional 40 mg by mouth at bedtime on Mon.,  Wed., and friday      . hydrALAZINE (APRESOLINE) 100 MG tablet Take 100 mg by mouth 3 (three) times daily.       Marland Kitchen lactulose (CHRONULAC) 10 GM/15ML solution Take 10 g by mouth daily as needed for mild constipation or moderate constipation.      Marland Kitchen loratadine (CLARITIN) 10 MG tablet Take 10 mg by mouth at bedtime.       Marland Kitchen losartan (COZAAR) 100 MG tablet Take 100 mg by mouth daily.      Marland Kitchen  omeprazole (PRILOSEC) 40 MG capsule Take 1 capsule (40 mg total) by mouth 2 (two) times daily before a meal. 60 capsule 4  . polycarbophil (FIBERCON) 625 MG tablet Take 625 mg by mouth daily.      . sennosides-docusate sodium (SENOKOT-S) 8.6-50 MG tablet Take 1 tablet by mouth 2 (two) times daily as needed for constipation.      . Sodium Phosphates (RA SALINE ENEMA RE) Place 1 application rectally as needed (take every other day as needed for constipation).      . sucralfate (CARAFATE) 1 GM/10ML suspension Take 10 mLs (1 g total) by mouth 2 (two) times daily. Take once during the day and before bedtime.  Try to no take any other medications 1 hour before or after use of Carafate. 420 mL 1  . vitamin B-12 (CYANOCOBALAMIN) 500 MCG tablet Take 1,000 mcg by mouth daily.      Marland Kitchen NIFEdipine (ADALAT CC) 60 MG 24 hr tablet Take 60 mg by mouth daily. (Patient not taking: Reported on 01/23/2020)          Drug Regimen Review  Drug regimen was reviewed and remains appropriate with no significant issues identified   Home: Home Living Family/patient expects to be discharged to:: Private residence Living Arrangements: Spouse/significant other Available Help at Discharge: Family, Available 24 hours/day Type of Home: House Home Access: Stairs to enter [employer] of Steps: 2 Entrance Stairs-Rails: Can reach both Home Layout: Two level Alternate Level Stairs-Number of Steps: 13 Alternate Level Stairs-Rails: Right, Left (split rail, half left and half right) Bathroom Shower/Tub: Chiropodist:  Standard Home Equipment: Environmental consultant - 2 wheels, Cane - single point, Grab bars - toilet, Grab bars - tub/shower  Lives With: Spouse   Functional History: Prior Function Level of Independence: Independent, Independent with assistive device(s) Comments: pt reports use of cane intermittently when outdoors   Functional Status:  Mobility: Bed Mobility Overal bed mobility: Needs Assistance Bed Mobility: Supine to Sit Supine to sit: Mod assist, HOB elevated General bed mobility comments: up in chair with OT upon arrival to room Transfers Overall transfer level: Needs assistance Equipment used: Rolling walker (2 wheeled) Transfers: Sit to/from Stand, [employer] Transfers Sit to Stand: Mod assist Stand pivot transfers: Mod assist General transfer comment: Mod assist for power up, hip extension with posterior trunk tactile facilitation, and steadying upon standing. sit<>stand x4 throughout session, with stand pivot to and from transport chair each time. Ambulation/Gait Ambulation/Gait assistance: Min assist Gait Distance (Feet): 90 Feet (+70) Assistive device: Rolling walker (2 wheeled) Gait Pattern/deviations: Step-through pattern, Decreased stride length, Shuffle, Drifts right/left General Gait Details: Min assist to steady, avoid obstacles to pt L. Poor attention to L and almost running into NP in hallway. PT asks "did you see her", pt states "no I didn't". Gait velocity: decr Gait velocity interpretation: <1.8 ft/sec, indicate of risk for recurrent falls   ADL: ADL Overall ADL's : Needs assistance/impaired Eating/Feeding: Set up, Sitting Grooming: Minimal assistance, Sitting Upper Body Bathing: Minimal assistance, Sitting Lower Body Bathing: Moderate assistance, Sit to/from stand Upper Body Dressing : Minimal assistance, Sitting Upper Body Dressing Details (indicate cue type and reason): to don gown as back side cover Lower Body Dressing: Min guard Lower Body Dressing Details  (indicate cue type and reason): to adjust socks from EOB with minguard when reaching out of BOS Toilet Transfer: Minimal assistance, RW, Stand-pivot, Cueing for sequencing, Cueing for safety, Maximal assistance, Moderate assistance Toilet Transfer Details (indicate cue type and  reason): MAX A +1 to intially power up into standing from EOB, MOD A +1 for stand pivot transfer to recliner, cues for sequencing of task and body mechanics in relation to RW mgmt Toileting- Clothing Manipulation and Hygiene: Moderate assistance, Sit to/from stand Tub/ Shower Transfer: Tub transfer, Moderate assistance, Ambulation, Rolling walker, 3 in 1 Functional mobility during ADLs: Minimal assistance, Moderate assistance, Rolling walker, Cueing for safety, Cueing for sequencing General ADL Comments: pt continues to present with generalized weakness RUE>LUE, decreased activity tolerance and impaired balance impacting pts ability to complete BADLs   Cognition: Cognition Overall Cognitive Status: Within Functional Limits for tasks assessed Arousal/Alertness: Awake/alert Orientation Level: Oriented X4 Attention: Focused, Sustained Focused Attention: Appears intact Sustained Attention: Appears intact Memory: Impaired Memory Impairment: Retrieval deficit, Decreased recall of new information (Immediate: 3/3; delayed: 2/3; with cues: 1/1) Awareness: Appears intact Problem Solving: Appears intact Executive Function: Reasoning Reasoning: Impaired Reasoning Impairment: Verbal complex Cognition Arousal/Alertness: Awake/alert Behavior During Therapy: WFL for tasks assessed/performed, Flat affect (slightly flat, however affect improved when pt got to see his wife) Overall Cognitive Status: Within Functional Limits for tasks assessed General Comments: pt disoriented to day of week, date and year but able to state location and month. pt fixated on getting to see wife. collaborated with PT to transport pt to see wife.      Blood pressure (!) 152/89, pulse 64, temperature 98.3 F (36.8 C), temperature source Axillary, resp. rate 20, height 6\' 1"  (1.854 m), weight 104.2 kg, SpO2 99 %. Physical Exam Nursing note reviewed.  Constitutional:      Appearance: Normal appearance.  HENT:     Head: Normocephalic.     Nose: Nose normal.  Cardiovascular:     Rate and Rhythm: Normal rate.  Pulmonary:     Effort: Pulmonary effort is normal.  Neurological:     Mental Status: He is alert and oriented to person, place, and time.     Comments: Disoriented today--was sleeping soundly prior to aroused for exam and was very slow to orient to surrounding.  Mild dysarthria noted. Intentional tremors noted BUE. Moves all 4's but MMT difficult d/t inconsistent participation. Senses pain in all 4's.   Psychiatric:     Comments: flat      Lab Results Last 48 Hours        Results for orders placed or performed during the hospital encounter of 01/21/20 (from the past 48 hour(s))  CBC with Differential/Platelet     Status: Abnormal    Collection Time: 01/28/20  3:46 AM  Result Value Ref Range    WBC 6.7 4.0 - 10.5 K/uL    RBC 2.99 (L) 4.22 - 5.81 MIL/uL    Hemoglobin 7.7 (L) 13.0 - 17.0 g/dL    HCT 24.8 (L) 39 - 52 %    MCV 82.9 80.0 - 100.0 fL    MCH 25.8 (L) 26.0 - 34.0 pg    MCHC 31.0 30.0 - 36.0 g/dL    RDW 13.5 11.5 - 15.5 %    Platelets 195 150 - 400 K/uL    nRBC 0.0 0.0 - 0.2 %    Neutrophils Relative % 67 %    Neutro Abs 4.5 1.7 - 7.7 K/uL    Lymphocytes Relative 13 %    Lymphs Abs 0.9 0.7 - 4.0 K/uL    Monocytes Relative 13 %    Monocytes Absolute 0.9 0.1 - 1.0 K/uL    Eosinophils Relative 5 %    Eosinophils Absolute 0.3  0.0 - 0.5 K/uL    Basophils Relative 1 %    Basophils Absolute 0.0 0.0 - 0.1 K/uL    Immature Granulocytes 1 %    Abs Immature Granulocytes 0.04 0.00 - 0.07 K/uL      Comment: Performed at [facility], [place] [address] [place], [place] [address]  Basic metabolic panel     Status:  Abnormal    Collection Time: 01/28/20  3:46 AM  Result Value Ref Range    Sodium 143 135 - 145 mmol/L    Potassium 4.4 3.5 - 5.1 mmol/L    Chloride 112 (H) 98 - 111 mmol/L    CO2 23 22 - 32 mmol/L    Glucose, Bld 120 (H) 70 - 99 mg/dL      Comment: Glucose reference range applies only to samples taken after fasting for at least 8 hours.    BUN 45 (H) 8 - 23 mg/dL    Creatinine, Ser 2.51 (H) 0.61 - 1.24 mg/dL    Calcium 8.3 (L) 8.9 - 10.3 mg/dL    GFR, Estimated 25 (L) >60 mL/min      Comment: (NOTE) Calculated using the CKD-EPI Creatinine Equation (2021)      Anion gap 8 5 - 15      Comment: Performed at [facility] [address], [place], [place] [address]  Glucose, capillary     Status: Abnormal    Collection Time: 01/29/20  5:40 AM  Result Value Ref Range    Glucose-Capillary 136 (H) 70 - 99 mg/dL      Comment: Glucose reference range applies only to samples taken after fasting for at least 8 hours.  Basic metabolic panel     Status: Abnormal    Collection Time: 01/29/20  9:31 AM  Result Value Ref Range    Sodium 140 135 - 145 mmol/L    Potassium 4.3 3.5 - 5.1 mmol/L    Chloride 109 98 - 111 mmol/L    CO2 22 22 - 32 mmol/L    Glucose, Bld 190 (H) 70 - 99 mg/dL      Comment: Glucose reference range applies only to samples taken after fasting for at least 8 hours.    BUN 48 (H) 8 - 23 mg/dL    Creatinine, Ser 2.64 (H) 0.61 - 1.24 mg/dL    Calcium 8.3 (L) 8.9 - 10.3 mg/dL    GFR, Estimated 24 (L) >60 mL/min      Comment: (NOTE) Calculated using the CKD-EPI Creatinine Equation (2021)      Anion gap 9 5 - 15      Comment: Performed at [facility] [address], [place], [place] [address]  CBC     Status: Abnormal    Collection Time: 01/29/20  9:31 AM  Result Value Ref Range    WBC 7.4 4.0 - 10.5 K/uL    RBC 3.03 (L) 4.22 - 5.81 MIL/uL    Hemoglobin 7.9 (L) 13.0 - 17.0 g/dL    HCT 25.2 (L) 39 - 52 %    MCV 83.2 80.0 - 100.0 fL    MCH 26.1 26.0 - 34.0 pg     MCHC 31.3 30.0 - 36.0 g/dL    RDW 13.4 11.5 - 15.5 %    Platelets 211 150 - 400 K/uL    nRBC 0.0 0.0 - 0.2 %      Comment: Performed at [facility], [place] [address], [place], [place]  [address]      Imaging Results (Last 48 hours)  No results found.           Medical Problem List and Plan: 1.  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              -patient may shower             -ELOS/Goals: 9-12 days, supervision to mod I with PT/OT 2.  Antithrombotics: -DVT/anticoagulation:  Pharmaceutical: Other (comment)--Eliquis             -antiplatelet therapy: N/A 3. Pain Management: tylenol prn for chronic LBP 4. Mood: LCSW to follow for evaluation and support.              -antipsychotic agents: N/A 5. Neuropsych: This patient is intermittently capable of making decisions on his own behalf. 6. Skin/Wound Care: Routine pressure relief measures.  7. Fluids/Electrolytes/Nutrition: Monitor I/O. Check lytes in am.  8. Aspiration PNA?: On Augmentin D # 8/10. 9. HTN: Monitor BP tid--continue Norvasc, coreg bid, Cardura bid, Proscar and Hydralazine tid.  10. GERD/gastric polyps: On Protonix. 11. PAF: Monitor HR tid --on coreg and Eliquis. 12.Acute on chronic anemia: Will continue to monitor H/H. Recheck CBC in am. Continue iron supplement.            Bary Leriche, PA-C 01/29/2020  Meredith Staggers, MD, [facility] 01/29/2020

## 2020-01-30 ENCOUNTER — Inpatient Hospital Stay (HOSPITAL_COMMUNITY): Payer: No Typology Code available for payment source

## 2020-01-30 DIAGNOSIS — I6381 Other cerebral infarction due to occlusion or stenosis of small artery: Secondary | ICD-10-CM

## 2020-01-30 LAB — COMPREHENSIVE METABOLIC PANEL
ALT: 45 U/L — ABNORMAL HIGH (ref 0–44)
AST: 39 U/L (ref 15–41)
Albumin: 2.3 g/dL — ABNORMAL LOW (ref 3.5–5.0)
Alkaline Phosphatase: 70 U/L (ref 38–126)
Anion gap: 8 (ref 5–15)
BUN: 49 mg/dL — ABNORMAL HIGH (ref 8–23)
CO2: 22 mmol/L (ref 22–32)
Calcium: 8.3 mg/dL — ABNORMAL LOW (ref 8.9–10.3)
Chloride: 111 mmol/L (ref 98–111)
Creatinine, Ser: 2.78 mg/dL — ABNORMAL HIGH (ref 0.61–1.24)
GFR, Estimated: 22 mL/min — ABNORMAL LOW (ref >60)
Glucose, Bld: 143 mg/dL — ABNORMAL HIGH (ref 70–99)
Potassium: 4.2 mmol/L (ref 3.5–5.1)
Sodium: 141 mmol/L (ref 135–145)
Total Bilirubin: 1 mg/dL (ref 0.3–1.2)
Total Protein: 5.6 g/dL — ABNORMAL LOW (ref 6.5–8.1)

## 2020-01-30 LAB — CBC WITH DIFFERENTIAL/PLATELET
Abs Immature Granulocytes: 0.04 10*3/uL (ref 0.00–0.07)
Basophils Absolute: 0.1 10*3/uL (ref 0.0–0.1)
Basophils Relative: 1 %
Eosinophils Absolute: 0.2 10*3/uL (ref 0.0–0.5)
Eosinophils Relative: 3 %
HCT: 24.3 % — ABNORMAL LOW (ref 39.0–52.0)
Hemoglobin: 7.6 g/dL — ABNORMAL LOW (ref 13.0–17.0)
Immature Granulocytes: 1 %
Lymphocytes Relative: 11 %
Lymphs Abs: 0.9 10*3/uL (ref 0.7–4.0)
MCH: 26 pg (ref 26.0–34.0)
MCHC: 31.3 g/dL (ref 30.0–36.0)
MCV: 83.2 fL (ref 80.0–100.0)
Monocytes Absolute: 0.9 10*3/uL (ref 0.1–1.0)
Monocytes Relative: 10 %
Neutro Abs: 6.2 10*3/uL (ref 1.7–7.7)
Neutrophils Relative %: 74 %
Platelets: 152 10*3/uL (ref 150–400)
RBC: 2.92 MIL/uL — ABNORMAL LOW (ref 4.22–5.81)
RDW: 13.4 % (ref 11.5–15.5)
WBC: 8.3 10*3/uL (ref 4.0–10.5)
nRBC: 0 % (ref 0.0–0.2)

## 2020-01-30 NOTE — Progress Notes (Signed)
[facility] Individual Statement of Services  Patient Name:  Todd [name]  Date:  01/30/2020  Welcome to the [facility].  Our goal is to provide you with an individualized program based on your diagnosis and situation, designed to meet your specific needs.  With this comprehensive rehabilitation program, you will be expected to participate in at least 3 hours of rehabilitation therapies Monday-Friday, with modified therapy programming on the weekends.  Your rehabilitation program will include the following services:  Physical Therapy (PT), Occupational Therapy (OT), Speech Therapy (ST), 24 hour per day rehabilitation nursing, Therapeutic Recreaction (TR), Neuropsychology, Care Coordinator, Rehabilitation Medicine, Nutrition Services, Pharmacy Services and Other  Weekly team conferences will be held on Tuesday to discuss your progress.  Your Inpatient Rehabilitation Care Coordinator will talk with you frequently to get your input and to update you on team discussions.  Team conferences with you and your family in attendance may also be held.  Expected length of stay: 9-12 Days  Overall anticipated outcome: MOD I to Supervision  Depending on your progress and recovery, your program may change. Your Inpatient Rehabilitation Care Coordinator will coordinate services and will keep you informed of any changes. Your Inpatient Rehabilitation Care Coordinator's name and contact numbers are listed  below.  The following services may also be recommended but are not provided by the [facility]:    [facility] will be made to provide these services after discharge if needed.  Arrangements include referral to agencies that provide these services.  Your insurance has been verified to be:  [place] Your primary doctor is:  Herold Harms,  MD  Pertinent information will be shared with your doctor and your insurance company.  Inpatient Rehabilitation Care Coordinator:  Erlene Quan, [place] or [phone]  Information discussed with and copy given to patient by: Dyanne Iha, 01/30/2020, 9:02 AM

## 2020-01-30 NOTE — Progress Notes (Signed)
Physical Therapy Session Note  Patient Details  Name: Todd [name] MRN: [id number] Date of Birth: 10-28-1940  Today's Date: 01/30/2020 PT Individual Time:Session1: 1660-6301; Arthor Captain: [id number] PT Individual Time Calculation (min): 45 min & 45 min  Short Term Goals: Week 1:  PT Short Term Goal 1 (Week 1): Patient to perform sit to stand min A PT Short Term Goal 2 (Week 1): Patient to ambulate 54' with LRAD and close S PT Short Term Goal 3 (Week 1): Patient to negotiate 8 steps wtih rail and CGA PT Short Term Goal 4 (Week 1): Patient to pick up item from floor with CGA  Skilled Therapeutic Interventions/Progress Updates:  Session1:Patient in recliner, stand pivot to w/c mod A no device.  Patient propelled w/c x 10' min A due to R UE weakness.  Pushed in chair to gym.  Stand step to mat min A to perform Berg balance assessment as noted below with score 23/56.  Patient ambulated to room from gym x 123' min A with RW.  Assisted to supine mod A for positioning and LE placement.  Patient left in supine with bed alarm activated and needs in reach.   Session2/Vestibular eval:  Patient in supine and asleep.  Aroused with time and max cues/stim.  Patient supine to sit max A due to lethargy.  Once seated able to arouse better, but admitted not remembering me telling him I would be back in an hour.  Patient sit to stand mod A to RW and ambulated to ortho gym x 60'.  Seated on Nu Step mod A for positioning and pt performed with UE/LE x 6 min at level 2.  Patient assisted to mat for vestibular assessment.  Noted difficulty with smooth pursuits past midline on R and with saccadic smooth pursuits on L side.  Performed limited VOR testing needing assist for head rotation and noted some dizziness with horizontal head movements, pt able to perform head nods and reports mild dizziness (< with horizontal head movement).  Pt ambulated to room with RW and min A and performed positional testing on bed noting no nystagmus  with sidelying test but some mild symptoms while in L sidelying.  Supine rolling R and L no dizziness and no nystagmus.  Patient relates he has intermittent dizziness that lasts for few moments when rolling in bed, but also can have some imbalance/lightheadedness with activities in standing.  Denies symptoms currently, but feels they may have started after he had a fall at home and likely about 3 years ago.  Reports about 3 falls in 6 months mostly due to dizziness symptoms.  Patient denies hearing changes with scratch test equal bilaterally.  Patient left in supine with bed alarm activated and needs in reach.   Therapy Documentation Precautions:  Precautions Precautions: Fall Restrictions Weight Bearing Restrictions: No Pain: Pain Assessment Pain Scale: 0-10 Pain Score: 0-No pain Faces Pain Scale: Hurts a little bit Pain Type: Acute pain Pain Location: Abdomen Pain Descriptors / Indicators: Aching;Discomfort Pain Onset: Gradual Pain Intervention(s): Ambulation/increased activity  Balance: Balance Balance Assessed: Yes Standardized Balance Assessment Standardized Balance Assessment: Berg Balance Test Berg Balance Test Sit to Stand: Needs minimal aid to stand or to stabilize Standing Unsupported: Able to stand 2 minutes with supervision Sitting with Back Unsupported but Feet Supported on Floor or Stool: Able to sit safely and securely 2 minutes Stand to Sit: Controls descent by using hands Transfers: Needs one person to assist Standing Unsupported with Eyes Closed: Able to stand 10 seconds with  supervision Standing Ubsupported with Feet Together: Able to place feet together independently and stand for 1 minute with supervision From Standing, Reach Forward with Outstretched Arm: Can reach forward >5 cm safely (2") From Standing Position, Pick up Object from Floor: Unable to try/needs assist to keep balance From Standing Position, Turn to Look Behind Over each Shoulder: Needs  supervision when turning Turn 360 Degrees: Needs assistance while turning Standing Unsupported, Alternately Place Feet on Step/Stool: Able to complete >2 steps/needs minimal assist Standing Unsupported, One Foot in Front: Needs help to step but can hold 15 seconds Standing on One Leg: Unable to try or needs assist to prevent fall Total Score: 23     Therapy/Group: Individual Therapy  Reginia Naas  Magda Kiel, PT 01/30/2020, 2:33 PM

## 2020-01-30 NOTE — Evaluation (Addendum)
Physical Therapy Assessment and Plan  Patient Details  Name: Todd [name] MRN: [id number] Date of Birth: March 04, 1941  PT Diagnosis: Abnormal posture and Abnormality of gait Rehab Potential: Good ELOS: 10-14 days   Today's Date: 01/30/2020 PT Individual Time: 1105-1205 PT Individual Time Calculation (min): 60 min    Hospital Problem: Principal Problem:   Stroke [facility])   Past Medical History:  Past Medical History:  Diagnosis Date  . Anemia   . Asthma    history of asthma  . Atrial fibrillation ([place])   . Chronic kidney disease   . Diabetes mellitus without complication ([place])    type 2 - no meds  . GERD (gastroesophageal reflux disease)   . History of atrial fibrillation, S/P EP ablation   . HLD (hyperlipidemia)   . Hypertension   . Seasonal allergies   . TIA (transient ischemic attack) 06/15/2017   Past Surgical History:  Past Surgical History:  Procedure Laterality Date  . COLONOSCOPY WITH ESOPHAGOGASTRODUODENOSCOPY (EGD)  04/2019  . ENDOSCOPIC MUCOSAL RESECTION  01/21/2020   Procedure: ENDOSCOPIC MUCOSAL RESECTION;  Surgeon: Rush Landmark Telford Nab., MD;  Location: [place];  Service: Gastroenterology;;  . ESOPHAGOGASTRODUODENOSCOPY N/A 10/17/2014   Procedure: ESOPHAGOGASTRODUODENOSCOPY (EGD);  Surgeon: Wilford Corner, MD;  Location: [facility] ENDOSCOPY;  Service: Endoscopy;  Laterality: N/A;  . ESOPHAGOGASTRODUODENOSCOPY (EGD) WITH PROPOFOL N/A 01/21/2020   Procedure: ESOPHAGOGASTRODUODENOSCOPY (EGD) WITH PROPOFOL;  Surgeon: Rush Landmark Telford Nab., MD;  Location: [place];  Service: Gastroenterology;  Laterality: N/A;  . HEMOSTASIS CLIP PLACEMENT  01/21/2020   Procedure: HEMOSTASIS CLIP PLACEMENT;  Surgeon: Irving Copas., MD;  Location: [place];  Service: Gastroenterology;;  . multiple tooth implants     pt thinks sedation  . SUBMUCOSAL LIFTING INJECTION  01/21/2020   Procedure: SUBMUCOSAL LIFTING INJECTION;  Surgeon: Rush Landmark Telford Nab., MD;   Location: [place];  Service: Gastroenterology;;    Assessment & Plan Clinical Impression: Todd [name] is an 69 year with history of PAF, T2DM, CKD, GERD, chronic back pain. Gastric polyps s/p endoscopic resection on 01/21/20. He developed hypotenison with dizziness and falls after discharge to home and was noted to have HR in 170-200 range on evaluation by EMS. He was cardioverted X 2 and found to have blown right pupil. Exam in ED revealed cogwheeling R>L extremities with RLE weakness. CT head negative. MRI/MRA braindone revealing subtle diffusion abnormality involving ventral left medulla question subtle infarct. CT chest showed diffuse infiltrates consistent with extensive left-sided pneumonia and noted to have WBC15.6 with hypoxia at admission.He was treated with Unasyn and transitioned to Augmentin for 10 total days of antibiotic regimen.   Dr. Caesar Bookman due to elevated cardiac enzymes noted at admission and felt that this was related to cardioversion/A. fib. Metabolic acidosis improved with hydration and coreg titrated upwards for rate control.Dr. Erlinda Hong felt the stroke was due to small vessel disease in setting of sepsis/septic shock and to resume Eliquis at 2.5 mg twice daily due to CKD. He did have a drop in his hemoglobin to 6.9 on 10/22 and was transfused with 1 units PRBCs. Blood pressures have been labile and hydralazine titrated   Patient transferred to CIR on 01/29/2020 .   Patient currently requires mod with mobility secondary to muscle weakness and muscle joint tightness and central origin and peripheral vertigo with history of falls.  Prior to hospitalization, patient was independent  with mobility and lived with Spouse in a House home.  Home access is 3Stairs to enter.  Patient will benefit from skilled PT intervention  to maximize safe functional mobility, minimize fall risk and decrease caregiver burden for planned discharge home with 24 hour supervision.  Anticipate  patient will benefit from follow up [place] at discharge.  PT - End of Session Activity Tolerance: Decreased this session;Tolerates 30+ min activity with multiple rests Endurance Deficit: Yes Endurance Deficit Description: generalized deconditioning PT Assessment Rehab Potential (ACUTE/IP ONLY): Good PT Barriers to Discharge: Decreased caregiver support PT Barriers to Discharge Comments: wife is inpatient, sister in law to assist PT Patient demonstrates impairments in the following area(s): Balance;Endurance;Motor;Safety PT Transfers Functional Problem(s): Bed Mobility;Bed to Chair;Car;Furniture PT Locomotion Functional Problem(s): Ambulation;Stairs PT Plan PT Intensity: Minimum of 1-2 x/day ,45 to 90 minutes PT Frequency: 5 out of 7 days PT Duration Estimated Length of Stay: 10-14 days PT Treatment/Interventions: Ambulation/gait training;Stair training;UE/LE Strength taining/ROM;Therapeutic Activities;Discharge planning;Balance/vestibular training;Functional mobility training;Patient/family education;Therapeutic Exercise PT Transfers Anticipated Outcome(s): mod I PT Locomotion Anticipated Outcome(s): Supervision stairs, mod I ambulation PT Recommendation Follow Up Recommendations: Home health PT;24 hour supervision/assistance Patient destination: Home Equipment Recommended: To be determined Equipment Details: possibly RW   PT Evaluation Precautions/Restrictions Precautions Precautions: Fall Restrictions Weight Bearing Restrictions: No General   Vital SignsTherapy Vitals Pulse Rate: 63 BP: (!) 153/74 Patient Position (if appropriate): Sitting Oxygen Therapy SpO2: 100 % O2 Device: Room Air Pulse Oximetry Type: Intermittent Pain Pain Assessment Pain Scale: 0-10 Pain Score: 0-No pain Faces Pain Scale: Hurts a little bit Pain Type: Acute pain Pain Location: Abdomen Pain Descriptors / Indicators: Aching;Discomfort Pain Onset: Gradual Pain Intervention(s): Ambulation/increased  activity Home Living/Prior Functioning Home Living Available Help at Discharge: Family;Available 24 hours/day Type of Home: House Home Access: Stairs to enter [employer] of Steps: 3 Entrance Stairs-Rails: Can reach both Home Layout: Two level Alternate Level Stairs-Number of Steps: 13 Alternate Level Stairs-Rails: Right;Left Bathroom Shower/Tub: Chiropodist: Standard Bathroom Accessibility: No  Lives With: Spouse Prior Function Level of Independence: Independent with gait;Independent with basic ADLs  Able to Take Stairs?: Yes Driving: No Vocation: Retired Biomedical scientist: Sales executive Comments: pt reports use of cane intermittently when outdoors Vision/Perception  Vision - Assessment Eye Alignment: Within Functional Limits Ocular Range of Motion: Impaired-to be further tested in functional context Alignment/Gaze Preference: Within Defined Limits Tracking/Visual Pursuits: Impaired - to be further tested in functional context Saccades: Impaired - to be further tested in functional context Additional Comments: Pt was unable to participate in formal visual testing. will continue to assess in functional context Perception Perception: Within Functional Limits Praxis Praxis: Impaired Praxis Impairment Details: Initiation  Cognition Overall Cognitive Status: Impaired/Different from baseline Arousal/Alertness: Awake/alert Orientation Level: Oriented X4 Attention: Sustained Focused Attention: Appears intact Sustained Attention: Appears intact Memory: Impaired Memory Impairment: Retrieval deficit;Decreased recall of new information Immediate Memory Recall: Sock;Blue;Bed Memory Recall Sock: Not able to recall Memory Recall Blue: With Cue Memory Recall Bed: Not able to recall Awareness: Appears intact Problem Solving: Impaired Safety/Judgment: Appears intact Comments: Pt with slow processing and some  confusion Sensation Sensation Light Touch: Appears Intact Coordination Gross Motor Movements are Fluid and Coordinated: No Fine Motor Movements are Fluid and Coordinated: No Coordination and Movement Description: slowed movements in UE/LE's Motor  Motor Motor: Abnormal postural alignment and control Motor - Skilled Clinical Observations: stiff neck, forward head, rounded shoulders   Trunk/Postural Assessment  Cervical Assessment Cervical Assessment: Exceptions to [facility] Cervical AROM Overall Cervical AROM Comments: limited in all directions approximately 80%. Thoracic Assessment Thoracic Assessment: Exceptions to [facility] Thoracic AROM Overall Thoracic AROM Comments: kyphosis, stiffness  Lumbar Assessment Lumbar Assessment: Exceptions to [facility] Lumbar AROM Overall Lumbar AROM Comments: stiff throughout Postural Control Postural Control: Deficits on evaluation Protective Responses: delayed with all mobility Postural Limitations: kyphosis, forward head, stiffness throughout  Balance Balance Balance Assessed: Yes Static Sitting Balance Static Sitting - Balance Support: No upper extremity supported;Feet supported Static Sitting - Level of Assistance: 6: Modified independent (Device/Increase time) Dynamic Sitting Balance Dynamic Sitting - Balance Support: No upper extremity supported Dynamic Sitting - Level of Assistance: 5: Stand by assistance Static Standing Balance Static Standing - Balance Support: No upper extremity supported Static Standing - Level of Assistance: 5: Stand by assistance Dynamic Standing Balance Dynamic Standing - Balance Support: No upper extremity supported Dynamic Standing - Level of Assistance: 4: Min assist Extremity Assessment  RUE Assessment RUE Assessment: Exceptions to [facility] General Strength Comments: Intention tremor. Shoulder flexion limited to 70 degrees. Unable to fully extend arm- ? tone vs baseline RUE Body System: Neuro Brunstrum levels for arm and  hand: Arm;Hand Brunstrum level for arm: Stage V Relative Independence from Synergy Brunstrum level for hand: Stage VI Isolated joint movements LUE Assessment LUE Assessment: Exceptions to [facility] General Strength Comments: 3+/5 overall. RLE Assessment RLE Assessment: Exceptions to [facility] Active Range of Motion (AROM) Comments: stiff, but WFL RLE Strength RLE Overall Strength Comments: hip flexion 4-/5, knee extension 4/5, ankle DF 4+/5 LLE Assessment LLE Assessment: Exceptions to [facility] Active Range of Motion (AROM) Comments: WFL grossly but stiffness present LLE Strength LLE Overall Strength Comments: hip flexion 4/5, knee extension 4+/5, ankle DF4+/5  Care Tool Care Tool Bed Mobility Roll left and right activity   Roll left and right assist level: Minimal Assistance - Patient > 75% (Simultaneous filing. User may not have seen previous data.)    Sit to lying activity   Sit to lying assist level: Minimal Assistance - Patient > 75% (Simultaneous filing. User may not have seen previous data.)    Lying to sitting edge of bed activity   Lying to sitting edge of bed assist level: Minimal Assistance - Patient > 75% (Simultaneous filing. User may not have seen previous data.)     Care Tool Transfers Sit to stand transfer   Sit to stand assist level: Moderate Assistance - Patient 50 - 74%    Chair/bed transfer   Chair/bed transfer assist level: Moderate Assistance - Patient 50 - 74% (Simultaneous filing. User may not have seen previous data.)     Toilet transfer   Assist Level: Maximal Assistance - Patient 24 - 49% (Simultaneous filing. User may not have seen previous data.)    Geneticist, molecular transfer assist level: Maximal Assistance - Patient 25 - 49%      Care Tool Locomotion Ambulation   Assist level: Minimal Assistance - Patient > 75% Assistive device: Walker-rolling Max distance: 90  Walk 10 feet activity   Assist level: Minimal Assistance - Patient > 75% Assistive device:  Walker-rolling   Walk 50 feet with 2 turns activity   Assist level: Minimal Assistance - Patient > 75% Assistive device: Walker-rolling  Walk 150 feet activity        Walk 10 feet on uneven surfaces activity   Assist level: Minimal Assistance - Patient > 75% Assistive device: Walker-rolling  Stairs   Assist level: Moderate Assistance - Patient - 50 - 74% Stairs assistive device: 1 hand rail;Other (comment) (hand hold A) Max number of stairs: 3  Walk up/down 1 step activity   Walk up/down 1 step (  curb) assist level: Minimal Assistance - Patient > 75% Walk up/down 1 step or curb assistive device: 1 hand rail;Other (comment) (hand hold A) Walk up/down 4 steps activity did not occuR: Safety/medical concerns  Walk up/down 4 steps activity      Walk up/down 12 steps activity Walk up/down 12 steps activity did not occur: Safety/medical concerns      Pick up small objects from floor Pick up small object from the floor (from standing position) activity did not occur: Safety/medical concerns      Wheelchair Will patient use wheelchair at discharge?: No          Wheel 50 feet with 2 turns activity      Wheel 150 feet activity        Refer to Care Plan for Long Term Goals  SHORT TERM GOAL WEEK 1 PT Short Term Goal 1 (Week 1): Patient to perform sit to stand min A PT Short Term Goal 2 (Week 1): Patient to ambulate 45' with LRAD and close S PT Short Term Goal 3 (Week 1): Patient to negotiate 8 steps wtih rail and CGA PT Short Term Goal 4 (Week 1): Patient to pick up item from floor with CGA  Recommendations for other services: Therapeutic Recreation  Other golfing  Skilled Therapeutic Intervention Patient in recliner upon entry and agreeable to evaluation.  Evaluation procedures performed as below.  Patient participated in gait training with RW and min A x 120' with cues for posture and assist for balance and safety.  Negotiated 3 steps in the stair well with mod A using R railing  and L HHA, descending with L LE first and R LE getting stuck on step increased time to lower.  Patient negotiated ramp with min A with RW.  Able to initially perform bed mobility with min A for R LE and positioning once supine.  Performed sit to stand mod A with lifting help and stand to sit with min A and cues.  Patient ambulated back to room and left in recliner with chair alarm active and needs in reach.   Mobility Bed Mobility Bed Mobility: Rolling Right Rolling Right: Contact Guard/Touching assist Transfers Transfers: Sit to Stand;Stand to Sit;Stand Pivot Transfers Sit to Stand: Moderate Assistance - Patient 50-74% Stand to Sit: Minimal Assistance - Patient > 75% Stand Pivot Transfers: Minimal Assistance - Patient > 75% Stand Pivot Transfer Details: Verbal cues for safe use of DME/AE;Verbal cues for precautions/safety Stand Pivot Transfer Details (indicate cue type and reason): cues for hand placement, positioning prior to sitting Transfer (Assistive device): Rolling walker Locomotion  Gait Ambulation: Yes Gait Assistance: Minimal Assistance - Patient > 75% Gait Distance (Feet): 100 Feet Assistive device: Rolling walker Gait Assistance Details: Verbal cues for safe use of DME/AE;Tactile cues for posture Gait Assistance Details: assist for balance and safety, cues for posture Gait Gait: Yes Gait Pattern: Impaired Gait Pattern: Step-through pattern;Decreased stride length;Decreased hip/knee flexion - right;Decreased hip/knee flexion - left;Shuffle Stairs / Additional Locomotion Ramp: Minimal Assistance - Patient >75%   Discharge Criteria: Patient will be discharged from PT if patient refuses treatment 3 consecutive times without medical reason, if treatment goals not met, if there is a change in medical status, if patient makes no progress towards goals or if patient is discharged from hospital.  The above assessment, treatment plan, treatment alternatives and goals were discussed  and mutually agreed upon: by patient  Jamison Oka, PT 01/30/2020, 2:23 PM

## 2020-01-30 NOTE — Progress Notes (Signed)
Patient information reviewed and entered into [facility] System by Becky Jermone Geister, PPS coordinator. Information including medical coding, function ability, and quality indicators will be reviewed and updated through discharge.   

## 2020-01-30 NOTE — Progress Notes (Signed)
Physical Medicine and Rehabilitation Consult     Reason for Consult: Functional decline  Referring Physician:  Dr. Andria [name]     HPI: Todd [name] is a 79 y.o. 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  After d/c to home he had issues with dizziness and falls due to hypotension and noted to have HR in 170-200 on evaluation by EMS.  History taken from chart review and patient.  He was cardioverted X 2 and found to have concerns of blown right pupil.  He was seen in ED and PERRL but had cogwheeling in R>L extremities and RLE weakness.   CT head unremarkable for acute intracranial process.  Patient was not a candidate for tPA. MRI/MRA brain done?  Subtle diffusion abnormality involving ventral left medulla question subtle infarct.    CT chest showed diffuse infiltrate consistent with extensive left-sided pneumonia.  He was started on IV antibiotics.  Cardiology, Dr. Einar [name] felt that elevated cardiac enzymes related to CV/Afib. Metabolic acidosis treated with IV fluids and Coreg titrated upwards for rate control. Therapy evaluation revealed ataxic gait with slurred speech. CIR recommended due to functional decline.    Review of Systems  Constitutional: Negative for fever.  HENT: Negative for hearing loss and tinnitus.   Eyes: Negative for blurred vision and double vision.  Respiratory: Negative for cough and shortness of breath.   Cardiovascular: Positive for leg swelling. Negative for chest pain and palpitations.  Gastrointestinal: Negative for constipation, heartburn and nausea.  Genitourinary: Negative for dysuria.  Musculoskeletal: Positive for back pain. Negative for myalgias.  Skin: Negative for rash.  Neurological: Positive for dizziness, tremors, speech change, focal weakness, weakness and headaches. Negative for sensory change.  Psychiatric/Behavioral: Negative for memory loss.  All other systems reviewed  and are negative.   Past Medical History:  Diagnosis Date  . Anemia    . Asthma      history of asthma  . Atrial fibrillation ([place])    . Chronic kidney disease    . Diabetes mellitus without complication ([place])      type 2 - no meds  . GERD (gastroesophageal reflux disease)    . HLD (hyperlipidemia)    . Hypertension    . Seasonal allergies    . TIA (transient ischemic attack) 06/15/2017           Past Surgical History:  Procedure Laterality Date  . COLONOSCOPY WITH ESOPHAGOGASTRODUODENOSCOPY (EGD)   04/2019  . ENDOSCOPIC MUCOSAL RESECTION   01/21/2020    Procedure: ENDOSCOPIC MUCOSAL RESECTION;  Surgeon: Todd Landmark Telford Nab., MD;  Location: [facility];  Service: Gastroenterology;;  . ESOPHAGOGASTRODUODENOSCOPY N/A 10/17/2014    Procedure: ESOPHAGOGASTRODUODENOSCOPY (EGD);  Surgeon: Todd Corner, MD;  Location: [facility] ENDOSCOPY;  Service: Endoscopy;  Laterality: N/A;  . ESOPHAGOGASTRODUODENOSCOPY (EGD) WITH PROPOFOL N/A 01/21/2020    Procedure: ESOPHAGOGASTRODUODENOSCOPY (EGD) WITH PROPOFOL;  Surgeon: Todd Landmark Telford Nab., MD;  Location: [facility];  Service: Gastroenterology;  Laterality: N/A;  . HEMOSTASIS CLIP PLACEMENT   01/21/2020    Procedure: HEMOSTASIS CLIP PLACEMENT;  Surgeon: Todd [name]., MD;  Location: [facility];  Service: Gastroenterology;;  . multiple tooth implants        pt thinks sedation  . SUBMUCOSAL LIFTING INJECTION   01/21/2020    Procedure: SUBMUCOSAL LIFTING INJECTION;  Surgeon: Todd Landmark Telford Nab., MD;  Location: [facility];  Service: Gastroenterology;;  Family History  Problem Relation Age of Onset  . Diabetes Mother    . Diabetes Father    . Stroke Father    . Colon cancer Father    . Diabetes Sister    . Stomach cancer Neg Hx    . Pancreatic cancer Neg Hx    . Esophageal cancer Neg Hx    . Inflammatory bowel disease Neg Hx    . Liver disease Neg Hx        Social History:  Married. 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  Wife was an Hydrologist for [employer]. He reports that he has never smoked. He has never used smokeless tobacco. He reports that he does not drink alcohol and does not use drugs.           Allergies  Allergen Reactions  . Lisinopril Cough  . Metformin And Related Other (See Comments)      chronic kidney disease            Medications Prior to Admission  Medication Sig Dispense Refill  . albuterol (PROVENTIL HFA;VENTOLIN HFA) 108 (90 Base) MCG/ACT inhaler Inhale 2 puffs into the lungs every 6 (six) hours as needed for wheezing or shortness of breath.      Marland Kitchen amLODipine (NORVASC) 10 MG tablet Take 10 mg by mouth daily.      Marland Kitchen apixaban (ELIQUIS) 5 MG TABS tablet Take 1 tablet (5 mg total) by mouth 2 (two) times daily. 60 tablet 0  . atorvastatin (LIPITOR) 80 MG tablet Take 1 tablet (80 mg total) by mouth daily at 6 PM. 30 tablet 0  . calcitRIOL (ROCALTROL) 0.25 MCG capsule Take 0.25 mcg by mouth daily.      . cholecalciferol (VITAMIN D) 25 MCG (1000 UNIT) tablet Take 1,000 Units by mouth daily.      Marland Kitchen docusate sodium (COLACE) 50 MG capsule Take 100 mg by mouth daily as needed for mild constipation.      Marland Kitchen doxazosin (CARDURA) 8 MG tablet Take 4-8 mg by mouth See admin instructions. Tale 8 mg in the morning and 4 mg at bedtime      . ferrous sulfate 325 (65 FE) MG tablet Take 325 mg by mouth daily.       . finasteride (PROSCAR) 5 MG tablet Take 5 mg by mouth daily.      . fluticasone (FLONASE) 50 MCG/ACT nasal spray Place 2 sprays into both nostrils daily as needed for allergies or rhinitis.      . furosemide (LASIX) 40 MG tablet Take 40 mg by mouth See admin instructions. Take 40 mg by mouth daily in the morning  Take additional 40 mg by mouth at bedtime on Mon., Wed., and friday      . hydrALAZINE (APRESOLINE) 100 MG tablet Take 100 mg by mouth 3 (three) times daily.       Marland Kitchen lactulose (CHRONULAC) 10 GM/15ML solution Take 10 g by mouth daily as needed for mild  constipation or moderate constipation.      Marland Kitchen loratadine (CLARITIN) 10 MG tablet Take 10 mg by mouth at bedtime.       Marland Kitchen losartan (COZAAR) 100 MG tablet Take 100 mg by mouth daily.      Marland Kitchen omeprazole (PRILOSEC) 40 MG capsule Take 1 capsule (40 mg total) by mouth 2 (two) times daily before a meal. 60 capsule 4  . polycarbophil (FIBERCON) 625 MG tablet Take 625 mg by mouth daily.      Marland Kitchen  sennosides-docusate sodium (SENOKOT-S) 8.6-50 MG tablet Take 1 tablet by mouth 2 (two) times daily as needed for constipation.      . Sodium Phosphates (RA SALINE ENEMA RE) Place 1 application rectally as needed (take every other day as needed for constipation).      . sucralfate (CARAFATE) 1 GM/10ML suspension Take 10 mLs (1 g total) by mouth 2 (two) times daily. Take once during the day and before bedtime.  Try to no take any other medications 1 hour before or after use of Carafate. 420 mL 1  . vitamin B-12 (CYANOCOBALAMIN) 500 MCG tablet Take 1,000 mcg by mouth daily.      Marland Kitchen NIFEdipine (ADALAT CC) 60 MG 24 hr tablet Take 60 mg by mouth daily. (Patient not taking: Reported on 01/23/2020)          Home: Home Living Family/patient expects to be discharged to:: Private residence Living Arrangements: Spouse/significant other Available Help at Discharge: Family, Available 24 hours/day Type of Home: House Home Access: Stairs to enter [employer] of Steps: 2 Entrance Stairs-Rails: Can reach both Home Layout: Two level Alternate Level Stairs-Number of Steps: 13 Alternate Level Stairs-Rails: Right, Left (split rail, half left and half right) Bathroom Shower/Tub: Chiropodist: Standard Home Equipment: Environmental consultant - 2 wheels, Cane - single point, Grab bars - toilet, Grab bars - tub/shower  Lives With: Spouse  Functional History: Prior Function Level of Independence: Independent, Independent with assistive device(s) Comments: pt reports use of cane intermittently when outdoors Functional  Status:  Mobility: Bed Mobility Overal bed mobility: Needs Assistance Bed Mobility: Supine to Sit Supine to sit: Supervision, HOB elevated General bed mobility comments: extra time and effort. pt utilized bed rail and a bit of momentum Transfers Overall transfer level: Needs assistance Equipment used: Rolling walker (2 wheeled) Transfers: Sit to/from Stand Sit to Stand: Mod assist, Min assist General transfer comment: mod A on initial stand from EOB, min A to steady/control descent into recliner. Cues for hand placement and technique with rw. Ambulation/Gait Ambulation/Gait assistance: Min guard Gait Distance (Feet): 12 Feet Assistive device: Rolling walker (2 wheeled) Gait Pattern/deviations: Step-to pattern, Decreased stance time - right, Ataxic General Gait Details: narrowed stance width with RLE scissoring Gait velocity: reduced Gait velocity interpretation: <1.31 ft/sec, indicative of household ambulator   ADL: ADL Overall ADL's : Needs assistance/impaired Eating/Feeding: Set up, Sitting Grooming: Minimal assistance, Sitting Upper Body Bathing: Minimal assistance, Sitting Lower Body Bathing: Moderate assistance, Sit to/from stand Upper Body Dressing : Minimal assistance, Sitting Lower Body Dressing: Moderate assistance, Sit to/from stand Toilet Transfer: Minimal assistance, Ambulation, RW Toileting- Clothing Manipulation and Hygiene: Moderate assistance, Sit to/from stand Tub/ Shower Transfer: Tub transfer, Moderate assistance, Ambulation, Rolling walker, 3 in 1 Functional mobility during ADLs: Minimal assistance, Rolling walker, Min guard General ADL Comments: Assist to powerup to standing and steady descent into sitting. Mostly min guard for functional mobility but light min A to steady on turns.    Cognition: Cognition Overall Cognitive Status: No family/caregiver present to determine baseline cognitive functioning Arousal/Alertness: Awake/alert Orientation Level:  Oriented to person, Oriented to place, Oriented to situation Attention: Focused, Sustained Focused Attention: Appears intact Sustained Attention: Appears intact Memory: Impaired Memory Impairment: Retrieval deficit, Decreased recall of new information (Immediate: 3/3; delayed: 2/3; with cues: 1/1) Awareness: Appears intact Problem Solving: Appears intact Executive Function: Reasoning Reasoning: Impaired Reasoning Impairment: Verbal complex Cognition Arousal/Alertness: Awake/alert Behavior During Therapy: Flat affect, WFL for tasks assessed/performed Overall Cognitive Status: No family/caregiver present to  determine baseline cognitive functioning General Comments: slow processing, delayed responses/decreased initiation at times. Flat affect.   Blood pressure (!) 129/100, pulse 65, temperature 100 F (37.8 C), temperature source Oral, resp. rate 17, height 6\' 1"  (1.854 m), weight 103.7 kg, SpO2 100 %. Physical Exam Vitals and nursing note reviewed.  Constitutional:      General: He is not in acute distress.    Appearance: Normal appearance. He is obese.  HENT:     Head: Normocephalic and atraumatic.     Right Ear: External ear normal.     Left Ear: External ear normal.     Nose: Nose normal.  Eyes:     General:        Right eye: No discharge.        Left eye: No discharge.     Extraocular Movements: Extraocular movements intact.  Cardiovascular:     Rate and Rhythm: Normal rate and regular rhythm.  Pulmonary:     Effort: Pulmonary effort is normal. No respiratory distress.     Breath sounds: No stridor.  Abdominal:     General: Bowel sounds are normal. There is no distension.  Musculoskeletal:     Cervical back: Normal range of motion and neck supple.     Right lower leg: Edema present.     Left lower leg: Edema present.     Comments: Right upper extremity edema  Skin:    General: Skin is warm and dry.  Neurological:     Mental Status: He is alert and oriented to  person, place, and time.     Comments: Alert Dysarthria Resting tremor Follow simple motor commands without difficulty.  Motor: LUE/LLE: 5/5 proximal distal RUE: 4 -/5 proximal distal RLE: 4/5 proximal distal  Psychiatric:        Mood and Affect: Mood normal.        Behavior: Behavior normal.        Lab Results Last 24 Hours       Results for orders placed or performed during the hospital encounter of 01/21/20 (from the past 24 hour(s))  CBC     Status: Abnormal    Collection Time: 01/23/20  4:31 PM  Result Value Ref Range    WBC 16.2 (H) 4.0 - 10.5 K/uL    RBC 3.01 (L) 4.22 - 5.81 MIL/uL    Hemoglobin 7.8 (L) 13.0 - 17.0 g/dL    HCT 25.1 (L) 39 - 52 %    MCV 83.4 80.0 - 100.0 fL    MCH 25.9 (L) 26.0 - 34.0 pg    MCHC 31.1 30.0 - 36.0 g/dL    RDW 14.0 11.5 - 15.5 %    Platelets 182 150 - 400 K/uL    nRBC 0.0 0.0 - 0.2 %  Glucose, capillary     Status: Abnormal    Collection Time: 01/23/20  5:07 PM  Result Value Ref Range    Glucose-Capillary 153 (H) 70 - 99 mg/dL    Comment 1 Notify RN      Comment 2 Document in Chart    Glucose, capillary     Status: Abnormal    Collection Time: 01/23/20  7:54 PM  Result Value Ref Range    Glucose-Capillary 170 (H) 70 - 99 mg/dL  Glucose, capillary     Status: Abnormal    Collection Time: 01/23/20 11:57 PM  Result Value Ref Range    Glucose-Capillary 133 (H) 70 - 99 mg/dL  Glucose, capillary  Status: Abnormal    Collection Time: 01/24/20  4:34 AM  Result Value Ref Range    Glucose-Capillary 108 (H) 70 - 99 mg/dL  CBC     Status: Abnormal    Collection Time: 01/24/20  5:36 AM  Result Value Ref Range    WBC 11.2 (H) 4.0 - 10.5 K/uL    RBC 2.58 (L) 4.22 - 5.81 MIL/uL    Hemoglobin 7.0 (L) 13.0 - 17.0 g/dL    HCT 21.9 (L) 39 - 52 %    MCV 84.9 80.0 - 100.0 fL    MCH 27.1 26.0 - 34.0 pg    MCHC 32.0 30.0 - 36.0 g/dL    RDW 14.2 11.5 - 15.5 %    Platelets 143 (L) 150 - 400 K/uL    nRBC 0.0 0.0 - 0.2 %  Glucose, capillary      Status: Abnormal    Collection Time: 01/24/20  7:48 AM  Result Value Ref Range    Glucose-Capillary 104 (H) 70 - 99 mg/dL  Basic metabolic panel     Status: Abnormal    Collection Time: 01/24/20  8:05 AM  Result Value Ref Range    Sodium 141 135 - 145 mmol/L    Potassium 4.3 3.5 - 5.1 mmol/L    Chloride 111 98 - 111 mmol/L    CO2 20 (L) 22 - 32 mmol/L    Glucose, Bld 123 (H) 70 - 99 mg/dL    BUN 49 (H) 8 - 23 mg/dL    Creatinine, Ser 2.96 (H) 0.61 - 1.24 mg/dL    Calcium 8.4 (L) 8.9 - 10.3 mg/dL    GFR, Estimated 21 (L) >60 mL/min    Anion gap 10 5 - 15  CBC     Status: Abnormal    Collection Time: 01/24/20  8:35 AM  Result Value Ref Range    WBC 11.4 (H) 4.0 - 10.5 K/uL    RBC 2.79 (L) 4.22 - 5.81 MIL/uL    Hemoglobin 7.4 (L) 13.0 - 17.0 g/dL    HCT 23.4 (L) 39 - 52 %    MCV 83.9 80.0 - 100.0 fL    MCH 26.5 26.0 - 34.0 pg    MCHC 31.6 30.0 - 36.0 g/dL    RDW 14.0 11.5 - 15.5 %    Platelets 161 150 - 400 K/uL    nRBC 0.0 0.0 - 0.2 %  Glucose, capillary     Status: Abnormal    Collection Time: 01/24/20 11:54 AM  Result Value Ref Range    Glucose-Capillary 123 (H) 70 - 99 mg/dL      Imaging Results (Last 48 hours)  No results found.     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  Stroke: Continue secondary stroke prophylaxis and Risk Factor Modification listed below:   Antiplatelet therapy:   Blood Pressure Management:  Continue current medication with prn's with permisive HTN per primary team Statin Agent:   Diabetes management:   Right sided hemiparesis: fit for orthosis to prevent contractures (resting hand splint for day, wrist cock up splint at night, PRAFO, etc) PT/OT for mobility, ADL training  Motor recovery: Fluoxetine Labs independently reviewed.  Records reviewed and summated above.   1. Does the need for close, 24 hr/day medical supervision in concert with the patient's rehab needs make it unreasonable for this patient to be served  in a less intensive setting? Yes  2. Co-Morbidities requiring supervision/potential complications: T2 DM with hyperglycemia (Monitor in accordance  with exercise and adjust meds as necessary), CKD (avoid nephrotoxic meds, repeat labs), CAF, TIA, chronic back pain (Biofeedback training with therapies to help reduce reliance on opiate pain medications, monitor pain control during therapies, and sedation at rest and titrate to maximum efficacy to ensure participation and gains in therapies), gastric polyps s/p endoscopic resection, leukocytosis (repeat labs, cont to monitor for signs and symptoms of infection, further workup if indicated) 3. Due to safety, disease management and patient education, does the patient require 24 hr/day rehab nursing? Yes 4. Does the patient require coordinated care of a physician, rehab nurse, therapy disciplines of PT/OT to address physical and functional deficits in the context of the above medical diagnosis(es)? Yes Addressing deficits in the following areas: balance, endurance, locomotion, strength, transferring, bathing, dressing, toileting and psychosocial support 5. Can the patient actively participate in an intensive therapy program of at least 3 hrs of therapy per day at least 5 days per week? Yes 6. The potential for patient to make measurable gains while on inpatient rehab is excellent and good 7. Anticipated functional outcomes upon discharge from inpatient rehab are modified independent and supervision  with PT, modified independent and supervision with OT, n/a with SLP. 8. Estimated rehab length of stay to reach the above functional goals is: 7-12 days. 9. Anticipated discharge destination: Home 10. Overall Rehab/Functional Prognosis: good   RECOMMENDATIONS: This patient's condition is appropriate for continued rehabilitative care in the following setting: CIR patient does not progress to supervision level of functioning. Patient has agreed to participate in  recommended program. Yes Note that insurance prior authorization may be required for reimbursement for recommended care.   Comment: Rehab Admissions Coordinator to follow up.   I have personally performed a face to face diagnostic evaluation, including, but not limited to relevant history and physical exam findings, of this patient and developed relevant assessment and plan.  Additionally, I have reviewed and concur with the physician assistant's documentation above.    Delice Lesch, MD, ABPMR Bary Leriche, PA-C 01/24/2020

## 2020-01-30 NOTE — Progress Notes (Signed)
[place] PHYSICAL MEDICINE & REHABILITATION PROGRESS NOTE   Subjective/Complaints:  Pt reports LBM yesterday- denies pain- is sleepy and doesn't want to be disturbed until 9am therapies.     ROS:  Pt denies SOB, abd pain, CP, N/V/C/D, and vision changes   Objective:   No results found. Recent Labs    01/29/20 0931 01/30/20 0450  WBC 7.4 8.3  HGB 7.9* 7.6*  HCT 25.2* 24.3*  PLT 211 152   Recent Labs    01/29/20 0931 01/30/20 0450  NA 140 141  K 4.3 4.2  CL 109 111  CO2 22 22  GLUCOSE 190* 143*  BUN 48* 49*  CREATININE 2.64* 2.78*  CALCIUM 8.3* 8.3*    Intake/Output Summary (Last 24 hours) at 01/30/2020 1946 Last data filed at 01/30/2020 1700 Gross per 24 hour  Intake 720 ml  Output --  Net 720 ml        Physical Exam: Vital Signs Blood pressure (!) 122/53, pulse (!) 53, temperature 98.9 F (37.2 C), resp. rate 17, height 6\' 1"  (1.854 m), weight 99.8 kg, SpO2 100 %.  General: asleep, but woke, still sleepy, disinclined to talk, NAD  HENT: conjugate gaze.  Cardiovascular: bradycardic regular rhythm Pulmonary: CTA B/L- no W/R/R- good air movement GI: Soft, NT, ND, (+)BS   Neurological: Appears alert, appropriate, however disinclined to speak, so hard to test Orientation  Mild dysarthria noted. Intentional tremors noted BUE. Moves all 4's but MMT difficult d/t inconsistent participation. Senses pain in all 4's.  Psychiatric:  Comments: flat, quiet  Assessment/Plan: 1. 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  Physiatrist is providing close team supervision and 24 hour management of active medical problems listed below.  Physiatrist and rehab team continue to assess barriers to discharge/monitor patient progress toward functional and medical goals  Care Tool:  Bathing    Body parts bathed by patient:  Right arm, Left arm, Chest, Abdomen, Front perineal area, Buttocks, Right upper leg, Left upper leg, Face   Body parts bathed by helper: Right lower leg, Left lower leg     Bathing assist Assist Level: Minimal Assistance - Patient > 75%     Upper Body Dressing/Undressing Upper body dressing   What is the patient wearing?: Pull over shirt    Upper body assist Assist Level: Minimal Assistance - Patient > 75%    Lower Body Dressing/Undressing Lower body dressing      What is the patient wearing?: Incontinence brief     Lower body assist Assist for lower body dressing: Moderate Assistance - Patient 50 - 74%     Toileting Toileting    Toileting assist Assist for toileting: Moderate Assistance - Patient 50 - 74% (Simultaneous filing. User may not have seen previous data.)     Transfers Chair/bed transfer  Transfers assist     Chair/bed transfer assist level: Moderate Assistance - Patient 50 - 74% (Simultaneous filing. User may not have seen previous data.)     Locomotion Ambulation   Ambulation assist      Assist level: Minimal Assistance - Patient > 75% Assistive device: Walker-rolling Max distance: 90   Walk 10 feet activity   Assist     Assist level: Minimal Assistance - Patient > 75% Assistive device: Walker-rolling   Walk 50 feet activity   Assist    Assist level: Minimal Assistance - Patient > 75% Assistive  device: Walker-rolling    Walk 150 feet activity   Assist           Walk 10 feet on uneven surface  activity   Assist     Assist level: Minimal Assistance - Patient > 75% Assistive device: Aeronautical engineer Will patient use wheelchair at discharge?: No Type of Wheelchair: Manual    Wheelchair assist level: Minimal Assistance - Patient > 75% Max wheelchair distance: 10'    Wheelchair 50 feet with 2 turns activity    Assist    Wheelchair 50 feet with 2 turns activity did not occur:  Safety/medical concerns       Wheelchair 150 feet activity     Assist  Wheelchair 150 feet activity did not occur: Safety/medical concerns       Blood pressure (!) 122/53, pulse (!) 53, temperature 98.9 F (37.2 C), resp. rate 17, height 6\' 1"  (1.854 m), weight 99.8 kg, SpO2 100 %.    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 -patient may shower -ELOS/Goals: 9-12 days, supervision to mod I with PT/OT  10/27- first day of therapies- starts at 9am 2. Antithrombotics: -DVT/anticoagulation:Pharmaceutical:Other (comment)--Eliquis -antiplatelet therapy: N/A 3. Pain Management:tylenol prn for chronic LBP  10/27- denies pain this AM- con't tylenol prn 4. Mood:LCSW to follow for evaluation and support. -antipsychotic agents: N/A 5. Neuropsych: This patientis intermittentlycapable of making decisions onhisown behalf. 6. Skin/Wound Care:Routine pressure relief measures. 7. Fluids/Electrolytes/Nutrition:Monitor I/O. Check lytes in am.  8. Aspiration PNA?: On Augmentin D # 8/10. 9. HTN: Monitor BP tid--continue Norvasc, coreg bid, Cardura bid, Proscar and Hydralazine tid.   10/27- BP well controlled- con't regimen 10. GERD/gastric polyps: On Protonix. 11. PAF: Monitor HR tid --on coreg and Eliquis. 12.Acute on chronic anemia: Will continue to monitor H/H. Recheck CBC in am. Continue iron supplement.  10/27- Hb dropped slightly to 7.6- will recheck and monitor closely.     LOS: 1 days A FACE TO FACE EVALUATION WAS PERFORMED  Harris Kistler 01/30/2020, 7:46 PM

## 2020-01-30 NOTE — Progress Notes (Signed)
PMR Admission Coordinator Pre-Admission Assessment   Patient: Todd [name] is an 79 y.o., male MRN: [id number] DOB: Jun 16, 1940 Height: 6' 1"  (185.4 cm) Weight: 104.2 kg                                                                                                                                                  Insurance Information HMO:     PPO:      PCP:      IPA:      80/20:      OTHER:  PRIMARY: [employer]      Policy#: [id number]      Subscriber: pt CM Name: Lattie Haw      Phone#: [phone] ext [phone]     Fax#: [phone] Pre-Cert#: [id number] [place] for CIR provided by Lattie Haw with [facility] Medicare for admit 10/26 with updates due to Lestine Box ([phone]) at fax listed above on 11/2      Employer:  Benefits:  Phone #: [phone]     Name:  Eff. Date: 04/06/19     Deduct: $0      Out of Pocket Max: $4000 ($0 met)      Life Max: n/a  CIR: $160/day for days 1-10      SNF: 100% Outpatient:      Co-Pay: $20/visit Home Health: 100%      Co-Pay:  DME: 80%     Co-Pay: 20% Providers:  SECONDARY: [facility]      Policy#: [id number]  (through wife)    Phone#:    Development worker, community:       Phone#:    The Therapist, art Information Summary" for patients in Inpatient Rehabilitation Facilities with attached "Privacy Act [place] Records" was provided and verbally reviewed with: Patient and Family   Emergency Contact Information         Contact Information     Name Relation Home Work Mobile    [place] Spouse [phone]       Current Medical History  Patient Admitting Diagnosis: L medullary infarct   History of Present Illness: 79 y.o. 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  After d/c to home he had issues with dizziness and falls due to hypotension and noted to have HR in 170-200 on evaluation by EMS.  History taken from chart review and patient.  He  was cardioverted X 2 and found to have concerns of blown right pupil.  He was seen in ED and PERRL but had cogwheeling in R>L extremities and RLE weakness.   CT head unremarkable for acute intracranial process.  Patient was not a candidate for tPA. MRI/MRA brain done?  Subtle diffusion abnormality involving ventral left medulla question subtle infarct.    CT chest showed diffuse infiltrate consistent with extensive left-sided  pneumonia.  He was started on IV antibiotics.  Cardiology, Dr. Einar Gip felt that elevated cardiac enzymes related to CV/Afib. Metabolic acidosis treated with IV fluids and Coreg titrated upwards for rate control. Therapy evaluation revealed ataxic gait with slurred speech. CIR recommended due to functional decline.    Complete NIHSS TOTAL: 2 Glasgow Coma Scale Score: 15   Past Medical History      Past Medical History:  Diagnosis Date  . Anemia    . Asthma      history of asthma  . Atrial fibrillation ([place])    . Chronic kidney disease    . Diabetes mellitus without complication ([place])      type 2 - no meds  . GERD (gastroesophageal reflux disease)    . History of atrial fibrillation, S/P EP ablation    . HLD (hyperlipidemia)    . Hypertension    . Seasonal allergies    . TIA (transient ischemic attack) 06/15/2017      Family History  family history includes Colon cancer in his father; Diabetes in his father, mother, and sister; Stroke in his father.   Prior Rehab/Hospitalizations:  Has the patient had prior rehab or hospitalizations prior to admission? No   Has the patient had major surgery during 100 days prior to admission? Yes   Current Medications    Current Facility-Administered Medications:  .   stroke: mapping our early stages of recovery book, , Does not apply, Once, Lattie Haw, MD .  acetaminophen (TYLENOL) tablet 650 mg, 650 mg, Oral, Q6H PRN, Zola Button, MD, 650 mg at 01/25/20 1650 .  albuterol (VENTOLIN HFA) 108 (90 Base) MCG/ACT inhaler 2  puff, 2 puff, Inhalation, Q6H PRN, Posey Pronto, Poonam, MD .  amLODipine (NORVASC) tablet 10 mg, 10 mg, Oral, Daily, Ganta, Anupa, DO, 10 mg at 01/29/20 0902 .  amoxicillin-clavulanate (AUGMENTIN) 500-125 MG per tablet 500 mg, 1 tablet, Oral, Q12H, Hensel, Jamal Collin, MD, 500 mg at 01/29/20 0901 .  apixaban (ELIQUIS) tablet 2.5 mg, 2.5 mg, Oral, BID, Dickie La, MD, 2.5 mg at 01/29/20 0902 .  atorvastatin (LIPITOR) tablet 80 mg, 80 mg, Oral, q1800, Lattie Haw, MD, 80 mg at 01/28/20 1821 .  carvedilol (COREG) tablet 12.5 mg, 12.5 mg, Oral, BID WC, Ganta, Anupa, DO, 12.5 mg at 01/29/20 0902 .  doxazosin (CARDURA) tablet 4 mg, 4 mg, Oral, Q12H, Gifford Shave, MD, 4 mg at 01/29/20 0901 .  ferrous sulfate tablet 325 mg, 325 mg, Oral, Daily, Lattie Haw, MD, 325 mg at 01/29/20 0902 .  finasteride (PROSCAR) tablet 5 mg, 5 mg, Oral, Daily, Gifford Shave, MD, 5 mg at 01/29/20 0902 .  fluticasone (FLONASE) 50 MCG/ACT nasal spray 2 spray, 2 spray, Each Nare, Daily PRN, Lattie Haw, MD .  gabapentin (NEURONTIN) capsule 100 mg, 100 mg, Oral, Daily PRN, Gifford Shave, MD, 100 mg at 01/29/20 0902 .  hydrALAZINE (APRESOLINE) tablet 75 mg, 75 mg, Oral, Q8H, Pashayan, Redgie Grayer, MD, 75 mg at 01/29/20 1409 .  pantoprazole (PROTONIX) EC tablet 40 mg, 40 mg, Oral, Daily, Lattie Haw, MD, 40 mg at 01/29/20 0902 .  polyethylene glycol (MIRALAX / GLYCOLAX) packet 17 g, 17 g, Oral, Daily, Benay Pike, MD, 17 g at 01/28/20 1026 .  senna (SENOKOT) tablet 8.6 mg, 1 tablet, Oral, Daily, Benay Pike, MD, 8.6 mg at 01/29/20 0901   Patients Current Diet:     Diet Order  Diet Heart Room service appropriate? Yes; Fluid consistency: Thin  Diet effective now                      Precautions / Restrictions Precautions Precautions: Fall Restrictions Weight Bearing Restrictions: No    Has the patient had 2 or more falls or a fall with injury in the past year?No   Prior  Activity Level Limited Community (1-2x/wk): independent prior to admission, no DME except occasional cane outside, not driving   Prior Functional Level Prior Function Level of Independence: Independent, Independent with assistive device(s) Comments: pt reports use of cane intermittently when outdoors   Self Care: Did the patient need help bathing, dressing, using the toilet or eating?  Independent   Indoor Mobility: Did the patient need assistance with walking from room to room (with or without device)? Independent   Stairs: Did the patient need assistance with internal or external stairs (with or without device)? Independent   Functional Cognition: Did the patient need help planning regular tasks such as shopping or remembering to take medications? Independent   Home Assistive Devices / Equipment Home Equipment: Environmental consultant - 2 wheels, [place] - single point, Grab bars - toilet, Grab bars - tub/shower   Prior Device Use: Indicate devices/aids used by the patient prior to current illness, exacerbation or injury? None of the above   Current Functional Level Cognition   Arousal/Alertness: Awake/alert Overall Cognitive Status: Within Functional Limits for tasks assessed Orientation Level: Oriented X4 General Comments: pt disoriented to day of week, date and year but able to state location and month. pt fixated on getting to see wife. collaborated with PT to transport pt to see wife. Attention: Focused, Sustained Focused Attention: Appears intact Sustained Attention: Appears intact Memory: Impaired Memory Impairment: Retrieval deficit, Decreased recall of new information (Immediate: 3/3; delayed: 2/3; with cues: 1/1) Awareness: Appears intact Problem Solving: Appears intact Executive Function: Reasoning Reasoning: Impaired Reasoning Impairment: Verbal complex    Extremity Assessment (includes Sensation/Coordination)   Upper Extremity Assessment: RUE deficits/detail, LUE  deficits/detail RUE Deficits / Details: AROM shoulder level to 90*, gross strength 4/5 but UE muscle groups a bit weaker when tested in isolation. Pt reports sensation intact. Decreased fine motor. Weak grip stength but able to hold onto rw. RUE Sensation: WNL RUE Coordination: decreased fine motor, decreased gross motor LUE Deficits / Details: generalized weakness.  LUE Sensation: WNL  Lower Extremity Assessment: Defer to PT evaluation     ADLs   Overall ADL's : Needs assistance/impaired Eating/Feeding: Set up, Sitting Grooming: Minimal assistance, Sitting Upper Body Bathing: Minimal assistance, Sitting Lower Body Bathing: Moderate assistance, Sit to/from stand Upper Body Dressing : Minimal assistance, Sitting Upper Body Dressing Details (indicate cue type and reason): to don gown as back side cover Lower Body Dressing: Min guard Lower Body Dressing Details (indicate cue type and reason): to adjust socks from EOB with minguard when reaching out of BOS Toilet Transfer: Minimal assistance, RW, Stand-pivot, Cueing for sequencing, Cueing for safety, Maximal assistance, Moderate assistance Toilet Transfer Details (indicate cue type and reason): MAX A +1 to intially power up into standing from EOB, MOD A +1 for stand pivot transfer to recliner, cues for sequencing of task and body mechanics in relation to RW mgmt Toileting- Clothing Manipulation and Hygiene: Moderate assistance, Sit to/from stand Tub/ Shower Transfer: Tub transfer, Moderate assistance, Ambulation, Rolling walker, 3 in 1 Functional mobility during ADLs: Minimal assistance, Moderate assistance, Rolling walker, Cueing for safety, Cueing for sequencing  General ADL Comments: pt continues to present with generalized weakness RUE>LUE, decreased activity tolerance and impaired balance impacting pts ability to complete BADLs     Mobility   Overal bed mobility: Needs Assistance Bed Mobility: Supine to Sit Supine to sit: Mod assist, HOB  elevated General bed mobility comments: up in chair with OT upon arrival to room     Transfers   Overall transfer level: Needs assistance Equipment used: Rolling walker (2 wheeled) Transfers: Sit to/from Stand, Stand Pivot Transfers Sit to Stand: Mod assist Stand pivot transfers: Mod assist General transfer comment: Mod assist for power up, hip extension with posterior trunk tactile facilitation, and steadying upon standing. sit<>stand x4 throughout session, with stand pivot to and from transport chair each time.     Ambulation / Gait / Stairs / Wheelchair Mobility   Ambulation/Gait Ambulation/Gait assistance: Herbalist (Feet): 90 Feet (+70) Assistive device: Rolling walker (2 wheeled) Gait Pattern/deviations: Step-through pattern, Decreased stride length, Shuffle, Drifts right/left General Gait Details: Min assist to steady, avoid obstacles to pt L. Poor attention to L and almost running into NP in hallway. PT asks "did you see her", pt states "no I didn't". Gait velocity: decr Gait velocity interpretation: <1.8 ft/sec, indicate of risk for recurrent falls     Posture / Balance Dynamic Sitting Balance Sitting balance - Comments: supervision Balance Overall balance assessment: Needs assistance Sitting-balance support: No upper extremity supported, Feet supported Sitting balance-Leahy Scale: Fair Sitting balance - Comments: supervision Standing balance support: Bilateral upper extremity supported, During functional activity Standing balance-Leahy Scale: Poor Standing balance comment: reliant on UE support of RW and minG     Special needs/care consideration Diabetic management yes and Designated visitor [place] (from acute therapy documentation) Living Arrangements: Spouse/significant other  Lives With: Spouse Available Help at Discharge: Family, Available 24 hours/day Type of Home: House Home Layout: Two  level Alternate Level Stairs-Rails: Right, Left (split rail, half left and half right) Alternate Level Stairs-Number of Steps: 13 Home Access: Stairs to enter Entrance Stairs-Rails: Can reach both Entrance Stairs-Number of Steps: 2 Bathroom Shower/Tub: Chiropodist: Standard   Discharge Living Setting Plans for Discharge Living Setting: Patient's home Type of Home at Discharge: House Discharge Home Layout: Two level, Bed/bath upstairs Alternate Level Stairs-Rails: Left (1/2 rail on R) Alternate Level Stairs-Number of Steps: 13 Discharge Home Access: Stairs to enter Entrance Stairs-Rails: Left, Right Entrance Stairs-Number of Steps: 2 Discharge Bathroom Shower/Tub: Tub/shower unit Discharge Bathroom Toilet: Standard Discharge Bathroom Accessibility: Yes How Accessible: Accessible via walker Does the patient have any problems obtaining your medications?: No   Social/Family/Support Systems Patient Roles: Spouse Anticipated Caregiver: Niko Penson (spouse) Anticipated Caregiver's Contact Information: [phone]; [phone] Ability/Limitations of Caregiver: supervision only Caregiver Availability: 24/7 Discharge Plan Discussed with Primary Caregiver: Yes Is Caregiver In Agreement with Plan?: Yes Does Caregiver/Family have Issues with Lodging/Transportation while Pt is in Rehab?: No     Goals Patient/Family Goal for Rehab: PT/OT supervision to mod I, SLP n/a Expected length of stay: 9-12 days Additional Information: pt also has VA benefits Pt/Family Agrees to Admission and willing to participate: Yes Program Orientation Provided & Reviewed with Pt/Caregiver Including Roles  & Responsibilities: Yes Additional Information Needs: no     Decrease burden of Care through IP rehab admission: n/a     Possible need for SNF placement upon discharge:Not anticipated     Patient Condition: I have reviewed medical records  [facility], spoken  with CM, and patient and spouse. I met with patient at the bedside for inpatient rehabilitation assessment.  Patient will benefit from ongoing PT, OT and SLP, can actively participate in 3 hours of therapy a day 5 days of the week, and can make measurable gains during the admission.  Patient will also benefit from the coordinated team approach during an Inpatient Acute Rehabilitation admission.  The patient will receive intensive therapy as well as Rehabilitation physician, nursing, social worker, and care management interventions.  Due to bladder management, bowel management, safety, skin/wound care, disease management, medication administration, pain management and patient education the patient requires 24 hour a day rehabilitation nursing.  The patient is currently min to mod assist with mobility and basic ADLs.  Discharge setting and therapy post discharge at home with home health is anticipated.  Patient has agreed to participate in the Acute Inpatient Rehabilitation Program and will admit today.   Preadmission Screen Completed By:  Michel Santee, PT, DPT 01/29/2020 3:28 PM ______________________________________________________________________   Discussed status with Dr. Naaman Plummer on 01/29/20 at 3:28 PM and received approval for admission today.   Admission Coordinator:  Michel Santee, PT, DPT time 3:28 PM Sudie Grumbling 01/29/20

## 2020-01-30 NOTE — Evaluation (Signed)
Occupational Therapy Assessment and Plan  Patient Details  Name: Todd [name] MRN: [id number] Date of Birth: 23-Jan-1941  OT Diagnosis: abnormal posture, cognitive deficits and muscle weakness (generalized) Rehab Potential: Rehab Potential (ACUTE ONLY): Good ELOS: 10-14 days   Today's Date: 01/30/2020 OT Individual Time: 7681-1572 OT Individual Time Calculation (min): 58 min     Hospital Problem: Principal Problem:   Stroke [facility])   Past Medical History:  Past Medical History:  Diagnosis Date  . Anemia   . Asthma    history of asthma  . Atrial fibrillation ([place])   . Chronic kidney disease   . Diabetes mellitus without complication ([place])    type 2 - no meds  . GERD (gastroesophageal reflux disease)   . History of atrial fibrillation, S/P EP ablation   . HLD (hyperlipidemia)   . Hypertension   . Seasonal allergies   . TIA (transient ischemic attack) 06/15/2017   Past Surgical History:  Past Surgical History:  Procedure Laterality Date  . COLONOSCOPY WITH ESOPHAGOGASTRODUODENOSCOPY (EGD)  04/2019  . ENDOSCOPIC MUCOSAL RESECTION  01/21/2020   Procedure: ENDOSCOPIC MUCOSAL RESECTION;  Surgeon: Rush Landmark Telford Nab., MD;  Location: [place];  Service: Gastroenterology;;  . ESOPHAGOGASTRODUODENOSCOPY N/A 10/17/2014   Procedure: ESOPHAGOGASTRODUODENOSCOPY (EGD);  Surgeon: Wilford Corner, MD;  Location: [facility] ENDOSCOPY;  Service: Endoscopy;  Laterality: N/A;  . ESOPHAGOGASTRODUODENOSCOPY (EGD) WITH PROPOFOL N/A 01/21/2020   Procedure: ESOPHAGOGASTRODUODENOSCOPY (EGD) WITH PROPOFOL;  Surgeon: Rush Landmark Telford Nab., MD;  Location: [place];  Service: Gastroenterology;  Laterality: N/A;  . HEMOSTASIS CLIP PLACEMENT  01/21/2020   Procedure: HEMOSTASIS CLIP PLACEMENT;  Surgeon: Irving Copas., MD;  Location: [place];  Service: Gastroenterology;;  . multiple tooth implants     pt thinks sedation  . SUBMUCOSAL LIFTING INJECTION  01/21/2020   Procedure: SUBMUCOSAL  LIFTING INJECTION;  Surgeon: Rush Landmark Telford Nab., MD;  Location: [place];  Service: Gastroenterology;;    Assessment & Plan Clinical Impression: Todd [name] is an 81 year with history of PAF, T2DM, CKD, GERD, chronic back pain. Gastric polyps s/p endoscopic resection on 01/21/20. He developed hypotenison with dizziness and falls after discharge to home and was noted to have HR in 170-200 range on evaluation by EMS. He was cardioverted X 2 and found to have blown right pupil. Exam in ED revealed cogwheeling R>L extremities with RLE weakness. CT head negative. MRI/MRA braindone revealing subtle diffusion abnormality involving ventral left medulla question subtle infarct. CT chest showed diffuse infiltrates consistent with extensive left-sided pneumonia and noted to have WBC15.6 with hypoxia at admission.He was treated with Unasyn and transitioned to Augmentin for 10 total days of antibiotic regimen.   Dr. Caesar Bookman due to elevated cardiac enzymes noted at admission and felt that this was related to cardioversion/A. fib. Metabolic acidosis improved with hydration and coreg titrated upwards for rate control.Dr. Erlinda Hong felt the stroke was due to small vessel disease in setting of sepsis/septic shock and to resume Eliquis at 2.5 mg twice daily due to CKD. He did have a drop in his hemoglobin to 6.9 on 10/22 and was transfused with 1 units PRBCs. Blood pressures have been labile and hydralazine titrated upwards.Patient transferred to CIR on 01/29/2020 .    Patient currently requires mod with basic self-care skills secondary to muscle weakness and muscle joint tightness, abnormal tone and decreased coordination and decreased sitting balance, decreased standing balance, decreased postural control, hemiplegia and decreased balance strategies.  Prior to hospitalization, patient could complete ADLs with independent .  Patient will benefit  from skilled intervention to decrease level of assist  with basic self-care skills prior to discharge home with care partner.  Anticipate patient will require 24 hour supervision and follow up home health.  OT - End of Session Activity Tolerance: Tolerates 10 - 20 min activity with multiple rests Endurance Deficit: Yes Endurance Deficit Description: generalized deconditioning OT Assessment Rehab Potential (ACUTE ONLY): Good OT Barriers to Discharge: Decreased caregiver support OT Barriers to Discharge Comments: wife is also in the hospital OT Patient demonstrates impairments in the following area(s): Balance;Behavior;Safety;Endurance;Motor;Vision;Cognition OT Basic ADL's Functional Problem(s): Bathing;Toileting;Dressing OT Transfers Functional Problem(s): Toilet;Tub/Shower OT Additional Impairment(s): None OT Plan OT Intensity: Minimum of 1-2 x/day, 45 to 90 minutes OT Frequency: 5 out of 7 days OT Duration/Estimated Length of Stay: 10-14 days OT Treatment/Interventions: Balance/vestibular training;Discharge planning;Pain management;Therapeutic Activities;Self Care/advanced ADL retraining;UE/LE Coordination activities;Cognitive remediation/compensation;Functional mobility training;Patient/family education;Therapeutic Exercise;UE/LE Strength taining/ROM;Psychosocial support;DME/adaptive equipment instruction;Neuromuscular re-education;Community reintegration;Visual/perceptual remediation/compensation OT Self Feeding Anticipated Outcome(s): no goal set OT Basic Self-Care Anticipated Outcome(s): mod I OT Toileting Anticipated Outcome(s): (S) OT Bathroom Transfers Anticipated Outcome(s): (S) OT Recommendation Recommendations for Other Services: Neuropsych consult Patient destination: Home Follow Up Recommendations: Home health OT Equipment Recommended: To be determined   OT Evaluation Precautions/Restrictions  Precautions Precautions: Fall Restrictions Weight Bearing Restrictions: No General Chart Reviewed: Yes Family/Caregiver Present:  No Vital Signs Therapy Vitals Pulse Rate: 63 BP: (!) 153/74 Patient Position (if appropriate): Sitting Oxygen Therapy SpO2: 100 % O2 Device: Room Air Pulse Oximetry Type: Intermittent Pain Pain Assessment Pain Scale: 0-10 Pain Score: 0-No pain Faces Pain Scale: Hurts a little bit Pain Type: Acute pain Pain Location: Abdomen Pain Descriptors / Indicators: Aching;Discomfort Pain Onset: Gradual Pain Intervention(s): Ambulation/increased activity Home Living/Prior Functioning Home Living Family/patient expects to be discharged to:: Private residence Living Arrangements: Spouse/significant other Available Help at Discharge: Family, Available 24 hours/day Type of Home: House Home Access: Stairs to enter Technical brewer of Steps: 3 Entrance Stairs-Rails: Can reach both Home Layout: Two level Alternate Level Stairs-Number of Steps: 13 Alternate Level Stairs-Rails: Right, Left Bathroom Shower/Tub: Chiropodist: Standard Bathroom Accessibility: No  Lives With: Spouse IADL History Homemaking Responsibilities: Yes Meal Prep Responsibility: Secondary Laundry Responsibility: Secondary Cleaning Responsibility: Secondary Bill Paying/Finance Responsibility: Secondary Shopping Responsibility: Secondary Current License: No Education: Some college Prior Function Level of Independence: Independent with gait, Independent with basic ADLs  Able to Take Stairs?: Yes Driving: No Vocation: Retired Biomedical scientist: Sales executive Comments: pt reports use of cane intermittently when outdoors Vision Baseline Vision/History: Wears glasses Wears Glasses: Reading only Patient Visual Report: No change from baseline Vision Assessment?: Yes Eye Alignment: Within Functional Limits Ocular Range of Motion: Impaired-to be further tested in functional context Alignment/Gaze Preference: Within Defined Limits Tracking/Visual Pursuits: Impaired - to be further  tested in functional context Saccades: Impaired - to be further tested in functional context Additional Comments: Pt was unable to participate in formal visual testing. will continue to assess in functional context Perception  Perception: Within Functional Limits Praxis Praxis: Impaired Praxis Impairment Details: Initiation Cognition Overall Cognitive Status: Impaired/Different from baseline Arousal/Alertness: Awake/alert Orientation Level: Person;Place;Situation Person: Oriented Place: Oriented Situation: Oriented Year: Other (Comment) (2031) Month: October Day of Week: Correct Memory: Impaired Memory Impairment: Retrieval deficit;Decreased recall of new information Immediate Memory Recall: Sock;Blue;Bed Memory Recall Sock: Not able to recall Memory Recall Blue: With Cue Memory Recall Bed: Not able to recall Attention: Sustained Focused Attention: Appears intact Sustained Attention: Appears intact Awareness: Appears intact Problem Solving: Impaired Safety/Judgment:  Appears intact Comments: Pt with slow processing and some confusion Sensation Sensation Light Touch: Appears Intact Coordination Gross Motor Movements are Fluid and Coordinated: No Fine Motor Movements are Fluid and Coordinated: No Coordination and Movement Description: slowed movements in UE/LE's Motor  Motor Motor: Abnormal postural alignment and control Motor - Skilled Clinical Observations: stiff neck, forward head, rounded shoulders  Trunk/Postural Assessment  Cervical Assessment Cervical Assessment: Exceptions to [facility] Cervical AROM Overall Cervical AROM Comments: limited in all directions approximately 80%. Thoracic Assessment Thoracic Assessment: Exceptions to [facility] Thoracic AROM Overall Thoracic AROM Comments: kyphosis, stiffness Lumbar Assessment Lumbar Assessment: Exceptions to [facility] Lumbar AROM Overall Lumbar AROM Comments: stiff throughout Postural Control Postural Control: Deficits on  evaluation Protective Responses: delayed with all mobility Postural Limitations: kyphosis, forward head, stiffness throughout  Balance Balance Balance Assessed: Yes Static Sitting Balance Static Sitting - Balance Support: No upper extremity supported;Feet supported Static Sitting - Level of Assistance: 6: Modified independent (Device/Increase time) Dynamic Sitting Balance Dynamic Sitting - Balance Support: No upper extremity supported Dynamic Sitting - Level of Assistance: 5: Stand by assistance Static Standing Balance Static Standing - Balance Support: No upper extremity supported Static Standing - Level of Assistance: 5: Stand by assistance Dynamic Standing Balance Dynamic Standing - Balance Support: No upper extremity supported Dynamic Standing - Level of Assistance: 4: Min assist Extremity/Trunk Assessment RUE Assessment RUE Assessment: Exceptions to [facility] General Strength Comments: Intention tremor. Shoulder flexion limited to 70 degrees. Unable to fully extend arm- ? tone vs baseline RUE Body System: Neuro Brunstrum levels for arm and hand: Arm;Hand Brunstrum level for arm: Stage V Relative Independence from Synergy Brunstrum level for hand: Stage VI Isolated joint movements LUE Assessment LUE Assessment: Exceptions to [facility] General Strength Comments: 3+/5 overall.  Care Tool Care Tool Self Care Eating   Eating Assist Level: Set up assist    Oral Care    Oral Care Assist Level: Contact Guard/Toucning assist    Bathing   Body parts bathed by patient: Right arm;Left arm;Chest;Abdomen;Front perineal area;Buttocks;Right upper leg;Left upper leg;Face Body parts bathed by helper: Right lower leg;Left lower leg   Assist Level: Minimal Assistance - Patient > 75%    Upper Body Dressing(including orthotics)   What is the patient wearing?: Pull over shirt   Assist Level: Minimal Assistance - Patient > 75%    Lower Body Dressing (excluding footwear)   What is the patient  wearing?: Incontinence brief;Pants Assist for lower body dressing: Moderate Assistance - Patient 50 - 74%    Putting on/Taking off footwear   What is the patient wearing?: Non-skid slipper socks Assist for footwear: Maximal Assistance - Patient 25 - 49%       Care Tool Toileting Toileting activity   Assist for toileting: Moderate Assistance - Patient 50 - 74% (Simultaneous filing. User may not have seen previous data.)     Care Tool Bed Mobility Roll left and right activity   Roll left and right assist level: Minimal Assistance - Patient > 75% (Simultaneous filing. User may not have seen previous data.)    Sit to lying activity   Sit to lying assist level: Minimal Assistance - Patient > 75% (Simultaneous filing. User may not have seen previous data.)    Lying to sitting edge of bed activity   Lying to sitting edge of bed assist level: Minimal Assistance - Patient > 75% (Simultaneous filing. User may not have seen previous data.)     Care Tool Transfers Sit to stand transfer  Sit to stand assist level: Moderate Assistance - Patient 50 - 74%    Chair/bed transfer   Chair/bed transfer assist level: Moderate Assistance - Patient 50 - 74% (Simultaneous filing. User may not have seen previous data.)     Toilet transfer   Assist Level: Maximal Assistance - Patient 24 - 49% (Simultaneous filing. User may not have seen previous data.)     Care Tool Cognition Expression of Ideas and Wants Expression of Ideas and Wants: Without difficulty (complex and basic) - expresses complex messages without difficulty and with speech that is clear and easy to understand   Understanding Verbal and Non-Verbal Content Understanding Verbal and Non-Verbal Content: Understands (complex and basic) - clear comprehension without cues or repetitions   Memory/Recall Ability *first 3 days only Memory/Recall Ability *first 3 days only: Current season;That he or she is in a hospital/hospital unit    Refer to  Care Plan for [place] 1 OT Short Term Goal 1 (Week 1): Pt will don LB clothing with LRAD and min A OT Short Term Goal 2 (Week 1): Pt will complete sit > stand from [facility] or toilet with min A OT Short Term Goal 3 (Week 1): Pt will complete toileting tasks with CGA  Recommendations for other services: Neuropsych   Skilled OT Evaluation completed as detailed above and below. Discussed OT POC, ELOS, and goal setting with pt. Recommend 24/7 supervision upon d/c. Likely will recommend a TTB and BSC as well. Pt motivated to return to PLOF.   Skilled Therapeutic Intervention ADL ADL Eating: Set up Where Assessed-Eating: Edge of bed Grooming: Supervision/safety Where Assessed-Grooming: Standing at sink Upper Body Bathing: Supervision/safety Where Assessed-Upper Body Bathing: Edge of bed Lower Body Bathing: Minimal assistance Where Assessed-Lower Body Bathing: Edge of bed Upper Body Dressing: Minimal assistance Where Assessed-Upper Body Dressing: Edge of bed Lower Body Dressing: Moderate assistance Where Assessed-Lower Body Dressing: Edge of bed Toileting: Moderate assistance Where Assessed-Toileting: Glass blower/designer: Psychiatric nurse Method: Counselling psychologist: Bedside commode Mobility  Bed Mobility Bed Mobility: Rolling Right Rolling Right: Contact Guard/Touching assist Transfers Sit to Stand: Moderate Assistance - Patient 50-74% Stand to Sit: Minimal Assistance - Patient > 75%   Discharge Criteria: Patient will be discharged from OT if patient refuses treatment 3 consecutive times without medical reason, if treatment goals not met, if there is a change in medical status, if patient makes no progress towards goals or if patient is discharged from hospital.  The above assessment, treatment plan, treatment alternatives and goals were discussed and mutually agreed upon: by patient  Curtis Sites 01/30/2020,  12:46 PM

## 2020-01-31 ENCOUNTER — Inpatient Hospital Stay (HOSPITAL_COMMUNITY): Payer: No Typology Code available for payment source

## 2020-01-31 ENCOUNTER — Inpatient Hospital Stay (HOSPITAL_COMMUNITY): Payer: No Typology Code available for payment source | Admitting: Occupational Therapy

## 2020-01-31 ENCOUNTER — Inpatient Hospital Stay (HOSPITAL_COMMUNITY): Payer: No Typology Code available for payment source | Admitting: Physical Therapy

## 2020-01-31 DIAGNOSIS — I6381 Other cerebral infarction due to occlusion or stenosis of small artery: Secondary | ICD-10-CM | POA: Diagnosis not present

## 2020-01-31 MED ORDER — SORBITOL 70 % SOLN
30.0000 mL | Freq: Once | Status: DC
  Filled 2020-01-31 (×2): qty 30

## 2020-01-31 NOTE — Progress Notes (Signed)
Patient Details  Name: Todd [name] MRN: [id number] Date of Birth: November 27, 1940  Today's Date: 01/31/2020  Hospital Problems: Principal Problem:   Stroke [facility])  Past Medical History:  Past Medical History:  Diagnosis Date   Anemia    Asthma    history of asthma   Atrial fibrillation ([place])    Chronic kidney disease    Diabetes mellitus without complication ([place])    type 2 - no meds   GERD (gastroesophageal reflux disease)    History of atrial fibrillation, S/P EP ablation    HLD (hyperlipidemia)    Hypertension    Seasonal allergies    TIA (transient ischemic attack) 06/15/2017   Past Surgical History:  Past Surgical History:  Procedure Laterality Date   COLONOSCOPY WITH ESOPHAGOGASTRODUODENOSCOPY (EGD)  04/2019   ENDOSCOPIC MUCOSAL RESECTION  01/21/2020   Procedure: ENDOSCOPIC MUCOSAL RESECTION;  Surgeon: Irving Copas., MD;  Location: [facility] ENDOSCOPY;  Service: Gastroenterology;;   ESOPHAGOGASTRODUODENOSCOPY N/A 10/17/2014   Procedure: ESOPHAGOGASTRODUODENOSCOPY (EGD);  Surgeon: Wilford Corner, MD;  Location: [facility] ENDOSCOPY;  Service: Endoscopy;  Laterality: N/A;   ESOPHAGOGASTRODUODENOSCOPY (EGD) WITH PROPOFOL N/A 01/21/2020   Procedure: ESOPHAGOGASTRODUODENOSCOPY (EGD) WITH PROPOFOL;  Surgeon: Rush Landmark Telford Nab., MD;  Location: [place];  Service: Gastroenterology;  Laterality: N/A;   HEMOSTASIS CLIP PLACEMENT  01/21/2020   Procedure: HEMOSTASIS CLIP PLACEMENT;  Surgeon: Rush Landmark Telford Nab., MD;  Location: [place];  Service: Gastroenterology;;   multiple tooth implants     pt thinks sedation   SUBMUCOSAL LIFTING INJECTION  01/21/2020   Procedure: SUBMUCOSAL LIFTING INJECTION;  Surgeon: Irving Copas., MD;  Location: [place];  Service: Gastroenterology;;   Social History:  reports that he has never smoked. He has never used smokeless tobacco. He reports that he does not drink alcohol and does not use drugs.  Family  / Support Systems Marital Status: Married Patient Roles: Spouse Spouse/Significant Other: Todd [name] Children: daughter Other Supports: sister in Sports coach Anticipated Caregiver: Spouse Ability/Limitations of Caregiver: Supervision Only Caregiver Availability: 24/7 Family Dynamics: Pt spouse being discharged from hospital today for pnumonia  Social History Preferred language: English Religion: Baptist Read: Yes Write: Yes Employment Status: Retired   Abuse/Neglect Abuse/Neglect Assessment Can Be Completed: Yes Physical Abuse: Denies Verbal Abuse: Denies Sexual Abuse: Denies Exploitation of patient/patient's resources: Denies Self-Neglect: Denies  Emotional Status Pt's affect, behavior and adjustment status: no Recent Psychosocial Issues: no Psychiatric History: no Substance Abuse History: no  Patient / Family Perceptions, Expectations & Goals Pt/Family understanding of illness & functional limitations: yes Premorbid pt/family roles/activities: Independent/No driving Pt/family expectations/goals: Supervision-Min A  [employer]: None Premorbid Home Care/DME Agencies: Other (Comment) (Cane, Grab bars (toilet and shower)) Transportation available at discharge: Family able to transport  Discharge Planning Living Arrangements: Spouse/significant other Support Systems: Spouse/significant other Type of Residence: Private residence (2 level home (B&B Upstairs,L sided railings, half railings on R- 13 steps), 2 Steps to enter home) Insurance Resources: Multimedia programmer (specify) ([place]) Financial Screen Referred: No Living Expenses: Own Money Management: Patient Does the patient have any problems obtaining your medications?: No Home Management: independent Care Coordinator Barriers to Discharge: Home environment access/layout Care Coordinator Anticipated Follow Up Needs: HH/OP Expected length of stay: 9-12 Days  Clinical Impression Sw  entered room sat at patient bedside, introduced self, explained role and process. Pt informed sw that spouse was being discharged from hospital today. His daughter and sister in law will be assisting in home. Will continue to follow up with questions  and concerns.   Dyanne Iha 01/31/2020, 12:31 PM

## 2020-01-31 NOTE — Progress Notes (Signed)
Physical Therapy Session Note  Patient Details  Name: Todd [name] MRN: [id number] Date of Birth: 08-16-1940  Today's Date: 01/31/2020 PT Individual Time: 0900-1000 PT Individual Time Calculation (min): 60 min   Short Term Goals: Week 1:  PT Short Term Goal 1 (Week 1): Patient to perform sit to stand min A PT Short Term Goal 2 (Week 1): Patient to ambulate 81' with LRAD and close S PT Short Term Goal 3 (Week 1): Patient to negotiate 8 steps wtih rail and CGA PT Short Term Goal 4 (Week 1): Patient to pick up item from floor with CGA  Skilled Therapeutic Interventions/Progress Updates:    pt received in bed and agreeable to therapy. Pt directed in lower body dressing in supine max A; supine>sit CGA; sat EOB statically CGA; and directed in STS and step transfer with Rolling walker to WC mod A. Pt then requested to use restroom, taken in [facility] into restroom, stand pivot transfer with grab bar CGA; stand pivot to toilet CGA max A for doffing pants and brief and pt had large BM, total A for hygiene though pt did attempt to clean himself ultimately needed PT to assist in care. Pt directed in static standing with 0-1 UE support for hygiene, donning new brief and pants CGA and then transferred to [facility] CGA with stand pivot. Pt taken to sink total A for time for hand hygiene at setup. Pt taken to gym in [facility] total A for time and energy conservation, directed in x5 STS from [facility] to Rolling walker min A initially then improved to CGA with VC for hand placement and reps. Pt then directed in gait training with Rolling walker for 450' then required sitting rest break and directed in 300' both reps at [facility] with brief instances of min A for stability VC for increased step height, trunk extension and increased Base of support and walker proximity. Pt then returned to room at end of gait training and directed in stand>sit to recliner, alarm set, All needs in reach and in good condition. Call light in hand.    Therapy  Documentation Precautions:  Precautions Precautions: Fall Restrictions Weight Bearing Restrictions: No   Pain: Pain Assessment Pain Score: 0-No pain    Therapy/Group: Individual Therapy  [name] 01/31/2020, 12:07 PM

## 2020-01-31 NOTE — Progress Notes (Signed)
Attempted to administer medication again, pt refused. Contacted on call PA Reesa Chew. She stated to encourage him to take Eliquis, pt continued to refuse. Called patients Wife and she was able to encourage him to take medication. Pt took all evening medication. No other concerns to report.

## 2020-01-31 NOTE — Progress Notes (Signed)
Physical Therapy Session Note  Patient Details  Name: Todd [name] MRN: [id number] Date of Birth: 29-Jan-1941  Today's Date: 01/31/2020 PT Individual Time: 1045-1200 PT Individual Time Calculation (min): 75 min   Short Term Goals: Week 1:  PT Short Term Goal 1 (Week 1): Patient to perform sit to stand min A PT Short Term Goal 2 (Week 1): Patient to ambulate 61' with LRAD and close S PT Short Term Goal 3 (Week 1): Patient to negotiate 8 steps wtih rail and CGA PT Short Term Goal 4 (Week 1): Patient to pick up item from floor with CGA  Skilled Therapeutic Interventions/Progress Updates:    Patient up in recliner, reports had unplanned BM after a laxative this morning and requesting better timing of laxatives. Patient sit to stand with mod A and ambulated x 130' min to CGA.  Negotiated 4 steps x 3 with bilateral rails and min A initially sequencing with L first up then R first down, then self selected sequence with step over step min cues for foot placement on step.  Patient in parallel bars for alternate taps to cone min UE support, then reliant on UE support due to difficulty with R foot coordination/grading/timing.  Patient with alternate stepping over and back stick on the ground cues for placement.  Gait without device and min to mod A on turns x 90'.  Discussed fall risk and fall prevention techniques including walker use right now.  Attempted gait with cane, but pt incontinent of bowel upon standing.  Ambulated with RW and min A to room and to bathroom.  Assisted doffing to attempt to keep from soiling clothing, but pt incontinent again upon doffing pants and brief.  Patient seated with assist to doff shoes and pants.  Sit to stand with grabbar and CGA and max A for hygiene, pt with 1 UE support for balance and CGA,  Assist to don new brief and pants, once started, pt pulled up.  Patient ambulated to recliner in room.  Seated for education on CVA warning signs and risk factors and risk  modification. Called pt's wife to discuss progress and plans.  She reports discharging home from hospital today.  Patient left up in recliner with needs in reach chair alarm active.  Therapy Documentation Precautions:  Precautions Precautions: Fall Restrictions Weight Bearing Restrictions: No Pain: Pain Assessment Pain Score: 0-No pain    Therapy/Group: Individual Therapy  Reginia Naas  Magda Kiel, PT 01/31/2020, 10:52 AM

## 2020-01-31 NOTE — Progress Notes (Signed)
Pt refused evening medication Cadura, Senakot, Apresoline, and eloquis  , educated pt. He stated he may take them later he may not. This nurse will return to room and offer medication again.

## 2020-01-31 NOTE — Progress Notes (Signed)
Occupational Therapy Session Note  Patient Details  Name: Todd [name] MRN: [id number] Date of Birth: [birth year]  Today's Date: 01/31/2020 OT Individual Time: 1401-1502 OT Individual Time Calculation (min): 61 min    Short Term Goals: Week 1:  OT Short Term Goal 1 (Week 1): Pt will don LB clothing with LRAD and min A OT Short Term Goal 2 (Week 1): Pt will complete sit > stand from [facility] or toilet with min A OT Short Term Goal 3 (Week 1): Pt will complete toileting tasks with CGA  Skilled Therapeutic Interventions/Progress Updates:    Pt greeted at time of session sitting up in recliner agreeable to OT session, no c/o pain but pt was cold. Sit to stand from recliner Mod A, when ambulating to the gym about to leave his room, pt stated he felt like he was going to have a BM, quickly ambulated to bathroom CGA/Min with RW, transferred to toilet in same manner with help controlling descent. Mod/Max for clothing management to doff, pt had large loose BM. Pt able to wash periarea, total A for washing buttocks in standing. Pt again felt like he was going to have another BM and did, BM very loose and did get on his clothing requiring brief and pant change. Hygiene and clothing management same as above. LB dress with Max A, pt able to thread LLE but help with RLE, Mod A to help don over hips. Performed donning brief in same manner. Extended time on toilet d/t loose BM and amount of cleaning/hygiene required. Ambulated to sink level and performed hand hygiene in sitting. Sister present, had many quetsions and answered to the best of ability. Pt up in recliner with alarm on, call bell in reach, all needs met. Note decreaesd/slow processing and extended time required for all tasks.   Therapy Documentation Precautions:  Precautions Precautions: Fall Restrictions Weight Bearing Restrictions: No     Therapy/Group: Individual Therapy  Viona Gilmore 01/31/2020, 4:38 PM

## 2020-01-31 NOTE — Progress Notes (Signed)
[facility] PHYSICAL MEDICINE & REHABILITATION PROGRESS NOTE   Subjective/Complaints:  Pt reports LBM 2 days ago, but nursing says has been 3+ days- feels very constipated and having "bowel issues".   Slept OK- denies HA's or pain.   Will give Sorbitol to help with bowel issues- if no BM, will give Mg citrate.     ROS:  Pt denies SOB, abd pain, CP, N/V, and vision changes   Objective:   No results found. Recent Labs    01/29/20 0931 01/30/20 0450  WBC 7.4 8.3  HGB 7.9* 7.6*  HCT 25.2* 24.3*  PLT 211 152   Recent Labs    01/29/20 0931 01/30/20 0450  NA 140 141  K 4.3 4.2  CL 109 111  CO2 22 22  GLUCOSE 190* 143*  BUN 48* 49*  CREATININE 2.64* 2.78*  CALCIUM 8.3* 8.3*    Intake/Output Summary (Last 24 hours) at 01/31/2020 1015 Last data filed at 01/31/2020 0759 Gross per 24 hour  Intake 600 ml  Output --  Net 600 ml        Physical Exam: Vital Signs Blood pressure (!) 156/71, pulse 62, temperature 97.9 F (36.6 C), temperature source Oral, resp. rate 20, height 6\' 1"  (1.854 m), weight 99.8 kg, SpO2 100 %.  General: sleepy, but more awake than yesterday- appropriate, hard to understand- quiet voice, NAD HENT: conjugate gaze.  Cardiovascular: RRR Pulmonary: CTA B/L- no W/R/R- good air movement GI: soft, but NT, and slightly distended- hypoactive BS Neurological: more alert, appropriate  Mild dysarthria noted. Intentional tremors noted BUE. Moves all 4's but MMT difficult d/t inconsistent participation. Senses pain in all 4's.  Psychiatric:  Comments: flat, quiet  Assessment/Plan: 1. 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  Physiatrist is providing close team supervision and 24 hour management of active medical problems listed below.  Physiatrist and rehab team continue to assess barriers to  discharge/monitor patient progress toward functional and medical goals  Care Tool:  Bathing    Body parts bathed by patient: Right arm, Left arm, Chest, Abdomen, Front perineal area, Buttocks, Right upper leg, Left upper leg, Face   Body parts bathed by helper: Right lower leg, Left lower leg     Bathing assist Assist Level: Minimal Assistance - Patient > 75%     Upper Body Dressing/Undressing Upper body dressing   What is the patient wearing?: Pull over shirt    Upper body assist Assist Level: Minimal Assistance - Patient > 75%    Lower Body Dressing/Undressing Lower body dressing      What is the patient wearing?: Incontinence brief     Lower body assist Assist for lower body dressing: Maximal Assistance - Patient 25 - 49%     Toileting Toileting    Toileting assist Assist for toileting: Moderate Assistance - Patient 50 - 74% (Simultaneous filing. User may not have seen previous data.)     Transfers Chair/bed transfer  Transfers assist     Chair/bed transfer assist level: Moderate Assistance - Patient 50 - 74% (Simultaneous filing. User may not have seen previous data.)     Locomotion Ambulation   Ambulation assist      Assist level: Minimal Assistance - Patient > 75% Assistive device: Walker-rolling Max distance: 90   Walk 10 feet activity   Assist     Assist level: Minimal Assistance - Patient > 75%  Assistive device: Walker-rolling   Walk 50 feet activity   Assist    Assist level: Minimal Assistance - Patient > 75% Assistive device: Walker-rolling    Walk 150 feet activity   Assist           Walk 10 feet on uneven surface  activity   Assist     Assist level: Minimal Assistance - Patient > 75% Assistive device: Aeronautical engineer Will patient use wheelchair at discharge?: No Type of Wheelchair: Manual    Wheelchair assist level: Minimal Assistance - Patient > 75% Max wheelchair distance: 10'     Wheelchair 50 feet with 2 turns activity    Assist    Wheelchair 50 feet with 2 turns activity did not occur: Safety/medical concerns       Wheelchair 150 feet activity     Assist  Wheelchair 150 feet activity did not occur: Safety/medical concerns       Blood pressure (!) 156/71, pulse 62, temperature 97.9 F (36.6 C), temperature source Oral, resp. rate 20, height 6\' 1"  (1.854 m), weight 99.8 kg, SpO2 100 %.    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 -patient may shower -ELOS/Goals: 9-12 days, supervision to mod I with PT/OT  10/27- first day of therapies- starts at 9am 2. Antithrombotics: -DVT/anticoagulation:Pharmaceutical:Other (comment)--Eliquis -antiplatelet therapy: N/A 3. Pain Management:tylenol prn for chronic LBP  10/28- denies pain- con't tylenol prn 4. Mood:LCSW to follow for evaluation and support. -antipsychotic agents: N/A 5. Neuropsych: This patientis intermittentlycapable of making decisions onhisown behalf. 6. Skin/Wound Care:Routine pressure relief measures. 7. Fluids/Electrolytes/Nutrition:Monitor I/O. Check lytes in am.  8. Aspiration PNA?: On Augmentin D # 8/10.  10/28- completed Augmentin 9. HTN: Monitor BP tid--continue Norvasc, coreg bid, Cardura bid, Proscar and Hydralazine tid.   10/28-BP 156/71- somewhat elevated, but was OK yesterday- will monitor x 24 hours and see if needs ot change meds 10. GERD/gastric polyps: On Protonix. 11. PAF: Monitor HR tid --on coreg and Eliquis. 12.Acute on chronic anemia: Will continue to monitor H/H. Recheck CBC in am. Continue iron supplement.  10/27- Hb dropped slightly to 7.6- will recheck and monitor closely.  13. Constipation  10/28- has been 3+ days- will give Sorbitol- if doesn't work, will give Mg citrate to clean him out.      LOS: 2 days A FACE TO  FACE EVALUATION WAS PERFORMED  Bear Osten 01/31/2020, 10:15 AM

## 2020-02-01 ENCOUNTER — Inpatient Hospital Stay (HOSPITAL_COMMUNITY): Payer: No Typology Code available for payment source | Admitting: Occupational Therapy

## 2020-02-01 ENCOUNTER — Inpatient Hospital Stay (HOSPITAL_COMMUNITY): Payer: No Typology Code available for payment source

## 2020-02-01 NOTE — Progress Notes (Signed)
Physical Therapy Session Note  Patient Details  Name: Todd [name] MRN: [id number] Date of Birth: 02-14-1941  Today's Date: 02/01/2020 PT Individual Time: 1030-1130 PT Individual Time Calculation (min): 60 min   Short Term Goals: Week 1:  PT Short Term Goal 1 (Week 1): Patient to perform sit to stand min A PT Short Term Goal 2 (Week 1): Patient to ambulate 44' with LRAD and close S PT Short Term Goal 3 (Week 1): Patient to negotiate 8 steps wtih rail and CGA PT Short Term Goal 4 (Week 1): Patient to pick up item from floor with CGA  Skilled Therapeutic Interventions/Progress Updates:    Patient in recliner in room and reports no pain or dizziness today.  Patient sit to stand min to mod A.  Patient ambulated x 230' min to CGA with RW, catching R foot with min A and cues for clearance at times.  Patient in dayroom for fall party to perform standing balance activities tossing cornhole and pingpong game into cups with S to CGA at times with 1 UE support.  Patient ambulated to gym x 120' CGA.  Standing balance and coordination activity tapping one foot, then alternate feet to number on 4" step for weight bearing R LE and coordination training R LE.  Patient negotiated 12 steps x 2 with bilateral rails and min A mod cues for foot clearance/placement.  Patient ambulated to room x 70' with CGA with RW and cues for wayfinding.  LEft in recliner with seat alarm active and needs in reach.  Therapy Documentation Precautions:  Precautions Precautions: Fall Restrictions Weight Bearing Restrictions: No Pain: Pain Assessment Pain Score: 0-No pain    Therapy/Group: Individual Therapy  Reginia Naas  Magda Kiel, PT 02/01/2020, 11:26 AM

## 2020-02-01 NOTE — Progress Notes (Signed)
[facility] PHYSICAL MEDICINE & REHABILITATION PROGRESS NOTE   Subjective/Complaints:  Pt reports slept better and eating well- reports had very large BM x2 "too much" yesterday- but explained he had it in him and it needed to come out.   Per chart, pt refused med lasts night- no explanation per pt of why- required calling his wife to make him take meds.     ROS:  Pt denies SOB, abd pain, CP, N/V/C/D, and vision changes   Objective:   No results found. Recent Labs    01/30/20 0450  WBC 8.3  HGB 7.6*  HCT 24.3*  PLT 152   Recent Labs    01/30/20 0450  NA 141  K 4.2  CL 111  CO2 22  GLUCOSE 143*  BUN 49*  CREATININE 2.78*  CALCIUM 8.3*    Intake/Output Summary (Last 24 hours) at 02/01/2020 1548 Last data filed at 02/01/2020 1300 Gross per 24 hour  Intake 418 ml  Output 200 ml  Net 218 ml        Physical Exam: Vital Signs Blood pressure (!) 130/59, pulse (!) 58, temperature 97.6 F (36.4 C), temperature source Oral, resp. rate 16, height 6\' 1"  (1.854 m), weight 99.8 kg, SpO2 100 %.  General: sitting up in bedside chair, more appropriate, NAD HENT: conjugate gaze.  Cardiovascular: RRR Pulmonary: CTA B/L- no W/R/R- good air movement GI: Soft, NT, ND, (+)BS   Neurological: more alert, appropriate  Mild dysarthria noted. Intentional tremors noted BUE for a few seconds this AM Moves all 4's but MMT difficult d/t inconsistent participation. Senses pain in all 4's.  Psychiatric:  Comments: flat, quiet  Assessment/Plan: 1. 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  Physiatrist is providing close team supervision and 24 hour management of active medical problems listed below.  Physiatrist and rehab team continue to assess barriers to discharge/monitor patient progress toward functional and medical goals  Care  Tool:  Bathing    Body parts bathed by patient: Right arm, Left arm, Chest, Abdomen, Front perineal area, Right upper leg, Left upper leg, Face   Body parts bathed by helper: Buttocks, Right lower leg, Left lower leg     Bathing assist Assist Level: Minimal Assistance - Patient > 75%     Upper Body Dressing/Undressing Upper body dressing   What is the patient wearing?: Pull over shirt    Upper body assist Assist Level: Contact Guard/Touching assist    Lower Body Dressing/Undressing Lower body dressing      What is the patient wearing?: Underwear/pull up, Pants     Lower body assist Assist for lower body dressing: Minimal Assistance - Patient > 75%     Toileting Toileting    Toileting assist Assist for toileting: Maximal Assistance - Patient 25 - 49%     Transfers Chair/bed transfer  Transfers assist     Chair/bed transfer assist level: Moderate Assistance - Patient 50 - 74%     Locomotion Ambulation   Ambulation assist      Assist level: Minimal Assistance - Patient > 75% Assistive device: Walker-rolling Max distance: 125'   Walk 10 feet activity   Assist     Assist level: Minimal Assistance - Patient > 75% Assistive device: Walker-rolling   Walk 50 feet activity   Assist    Assist level: Minimal Assistance - Patient > 75% Assistive device: Walker-rolling  Walk 150 feet activity   Assist Walk 150 feet activity did not occur: Safety/medical concerns         Walk 10 feet on uneven surface  activity   Assist     Assist level: Minimal Assistance - Patient > 75% Assistive device: Aeronautical engineer Will patient use wheelchair at discharge?: No Type of Wheelchair: Manual    Wheelchair assist level: Minimal Assistance - Patient > 75% Max wheelchair distance: 10'    Wheelchair 50 feet with 2 turns activity    Assist        Assist Level: Total Assistance - Patient < 25%   Wheelchair 150  feet activity     Assist      Assist Level: Total Assistance - Patient < 25%   Blood pressure (!) 130/59, pulse (!) 58, temperature 97.6 F (36.4 C), temperature source Oral, resp. rate 16, height 6\' 1"  (1.854 m), weight 99.8 kg, SpO2 100 %.    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 -patient may shower -ELOS/Goals: 9-12 days, supervision to mod I with PT/OT  10/27- first day of therapies- starts at 9am 2. Antithrombotics: -DVT/anticoagulation:Pharmaceutical:Other (comment)--Eliquis -antiplatelet therapy: N/A 3. Pain Management:tylenol prn for chronic LBP  10/29- denies pain now- con't tylenol prn 4. Mood:LCSW to follow for evaluation and support. -antipsychotic agents: N/A 5. Neuropsych: This patientis intermittentlycapable of making decisions onhisown behalf. 6. Skin/Wound Care:Routine pressure relief measures. 7. Fluids/Electrolytes/Nutrition:Monitor I/O. Check lytes in am.  8. Aspiration PNA?: On Augmentin D # 8/10.  10/28- completed Augmentin 9. HTN: Monitor BP tid--continue Norvasc, coreg bid, Cardura bid, Proscar and Hydralazine tid.   10/28-BP 156/71- somewhat elevated, but was OK yesterday- will monitor x 24 hours and see if needs ot change meds  10/29- BP 130s/80s- con't regimen 10. GERD/gastric polyps: On Protonix. 11. PAF: Monitor HR tid --on coreg and Eliquis. 12.Acute on chronic anemia: Will continue to monitor H/H. Recheck CBC in am. Continue iron supplement.  10/27- Hb dropped slightly to 7.6- will recheck and monitor closely.  13. Constipation  10/28- has been 3+ days- will give Sorbitol- if doesn't work, will give Mg citrate to clean him out.   10/29- Cleaned out 14. 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   10/29- Cr up to 2.78- last was 2.64- could be previous ABX- will recheck Monday- per nursing, drinking OK.      LOS: 3 days A FACE TO FACE EVALUATION WAS PERFORMED  Reema Chick 02/01/2020, 3:48 PM

## 2020-02-01 NOTE — IPOC Note (Signed)
Overall Plan of Care [facility]) Patient Details Name: Todd [name] MRN: [id number] DOB: 1940-08-29  Admitting Diagnosis: Stroke [facility])  Hospital Problems: Principal Problem:   Stroke [facility])     Functional Problem List: Nursing Endurance, Medication Management, Motor, Perception, Safety, Sensory  PT Balance, Endurance, Motor, Safety  OT Balance, Behavior, Safety, Endurance, Motor, Vision, Cognition  SLP    TR         Basic ADL's: OT Bathing, Toileting, Dressing     Advanced  ADL's: OT       Transfers: PT Bed Mobility, Bed to Chair, Car, Manufacturing systems engineer, Metallurgist: PT Ambulation, Stairs     Additional Impairments: OT None  SLP        TR      Anticipated Outcomes Item Anticipated Outcome  Self Feeding no goal set  Swallowing      Basic self-care  mod I  Toileting  (S)   Bathroom Transfers (S)  Bowel/Bladder  remain continent of bowel/bladder while in rehab  Transfers  mod I  Locomotion  Supervision stairs, mod I ambulation  Communication     Cognition     Pain  <2  Safety/Judgment  able to call for help and express need with mod I   Therapy Plan: PT Intensity: Minimum of 1-2 x/day ,45 to 90 minutes PT Frequency: 5 out of 7 days PT Duration Estimated Length of Stay: 10-14 days OT Intensity: Minimum of 1-2 x/day, 45 to 90 minutes OT Frequency: 5 out of 7 days OT Duration/Estimated Length of Stay: 10-14 days     Due to the current state of emergency, patients may not be receiving their 3-hours of Medicare-mandated therapy.   Team Interventions: Nursing Interventions Patient/Family Education, Disease Management/Prevention, Medication Management, Cognitive Remediation/Compensation, Discharge Planning  PT interventions Ambulation/gait training, Stair training, UE/LE Strength taining/ROM, Therapeutic Activities, Discharge planning, Balance/vestibular training, Functional mobility training, Patient/family education, Therapeutic  Exercise  OT Interventions Balance/vestibular training, Discharge planning, Pain management, Therapeutic Activities, Self Care/advanced ADL retraining, UE/LE Coordination activities, Cognitive remediation/compensation, Functional mobility training, Patient/family education, Therapeutic Exercise, UE/LE Strength taining/ROM, Psychosocial support, DME/adaptive equipment instruction, Neuromuscular re-education, Community reintegration, Visual/perceptual remediation/compensation  SLP Interventions    TR Interventions    SW/CM Interventions Discharge Planning, Psychosocial Support, Patient/Family Education   Barriers to Discharge MD  Medical stability, Home enviroment access/loayout, Lack of/limited family support, Weight, Medication compliance and Behavior  Nursing      PT Decreased caregiver support wife is inpatient, sister in law to assist  OT Decreased caregiver support wife is also in the hospital  SLP      SW Home environment access/layout     Team Discharge Planning: Destination: PT-Home ,OT- Home , SLP-  Projected Follow-up: PT-Home health PT, 24 hour supervision/assistance, OT-  Home health OT, SLP-  Projected Equipment Needs: PT-To be determined, OT- To be determined, SLP-  Equipment Details: PT-possibly RW, OT-  Patient/family involved in discharge planning: PT- Patient,  OT-Patient, SLP-   MD ELOS: 10-14 days Medical Rehab Prognosis:  Good Assessment:  Pt is an 79 yr old male with  Hx of pAfib, HTN, aspiration pneumonia in hospital admitted with L medullary infarct- and R hemiparesis.  Also has CKD stage 4 and Cr is rising- will check if was due to ABX, will recheck Monday.   Mod I to supervision are goals- by d/c.     See Team Conference Notes for weekly updates to the plan of care

## 2020-02-01 NOTE — Plan of Care (Signed)
°  Problem: Consults Goal: RH STROKE PATIENT EDUCATION Description: See Patient Education module for education specifics  Outcome: Progressing   Problem: RH BOWEL ELIMINATION Goal: RH STG MANAGE BOWEL WITH ASSISTANCE Description: STG Manage Bowel with Mod I Assistance. Outcome: Progressing   Problem: RH BLADDER ELIMINATION Goal: RH STG MANAGE BLADDER WITH ASSISTANCE Description: STG Manage Bladder With Mod I Assistance Outcome: Progressing   Problem: RH SAFETY Goal: RH STG ADHERE TO SAFETY PRECAUTIONS W/ASSISTANCE/DEVICE Description: STG Adhere to Safety Precautions With Mod I Assistance/Device. Outcome: Progressing Goal: RH STG DECREASED RISK OF FALL WITH ASSISTANCE Description: STG Decreased Risk of Fall With Mod I Assistance. Outcome: Progressing   Problem: RH PAIN MANAGEMENT Goal: RH STG PAIN MANAGED AT OR BELOW PT'S PAIN GOAL Description: <2 Outcome: Progressing

## 2020-02-01 NOTE — Progress Notes (Signed)
Occupational Therapy Session Note  Patient Details  Name: Todd [name] MRN: [id number] Date of Birth: 1940-09-16  Today's Date: 02/01/2020 OT Individual Time: 5329-9242 and [phone] and [id number] OT Individual Time Calculation (min): 60 min and 11 min and 73 min   Short Term Goals: Week 1:  OT Short Term Goal 1 (Week 1): Pt will don LB clothing with LRAD and min A OT Short Term Goal 2 (Week 1): Pt will complete sit > stand from [facility] or toilet with min A OT Short Term Goal 3 (Week 1): Pt will complete toileting tasks with CGA  Skilled Therapeutic Interventions/Progress Updates:    Pt greeted in bed with no c/o pain. Agreeable to showering this AM and picked out his clothing with setup of bag with his personal belongings. Min A for sit<stand from slightly elevated bed, CGA for ambulation with RW to the shower chair. Vcs for safe transfer into shower using the grab bar with manual facilitation for Rt UE placement. He then bathed at sit<stand level with mod cues for sequencing. CGA for dynamic standing balance. Note that at times he would stand idly and when cued if he wanted to sit back down, pt responded "yes" and would sit, or at other times would respond "where?" and OT would have to remind him that there was a chair behind him. Asssistance for washing feet and backside. Once OT donned his gripper socks, pt transferred to the elevated toilet to try for a continent void, note that he was incontinent of bladder in brief that was removed prior to shower. Given a few minutes, pt ultimately unable to void and did not want to keep trying. Therefore he got dressed from elevated toilet at sit<stand level using RW for standing support. Verbal and tactile cues for dressing the Rt side first, Min A for LB dressing overall. Pt adamant that he wanted to wear boxers vs brief today. Education provided on OT recommendation for brief due to loose stools and incontinence. Pt however really wanted to don his boxers still,  so boxers were donned. CGA for overhead shirt due to pt pulling shirt down over trunk in standing. He then ambulated with RW to the sink to engage in oral care. Pt required verbal cues to locate his toothpaste, questioning cues for the toothbrush due to visual deficits. He then reported that he needed to have an urgent BM. Returned to the toilet where pt required Total A for pants down due to urgency. Pt was left in care of NT to resume with voiding bowels. Note that throughout tx pt required vcs to increase functional use of the Rt hand. Tx focus placed on functional cognition, visual scanning, dynamic balance, Rt NMR, and ADL retraining.   2nd Session 1:1 tx (11 min) Pt greeted in the recliner. Agreeable to brush his teeth at the sink. He ambulated using RW with CGA to the sink where he engaged in hand washing and oral care while standing, CGA for balance. Vcs to locate soap dispenser and to thoroughly rinse all the soap off of his Rt hand. Pt reports that he often sees "grey" spots in his visual field. Min cuing to locate his toothbrush and toothpaste, note that pt initiated using the Rt hand at dominant level to manipulate his toothbrush. Able to find the kidney cup to rinse mouth out unassisted, ?question if this is due to size and contrast. Afterwards pt returned to the recliner, once again using RW with CGA. Left him repositioned for comfort with  all needs within reach and chair alarm set.    3rd Session 1:1 tx (73 min) Pt greeted in the recliner with no c/o pain, finishing up lunch and using his Rt hand at the dominant level without spillage. Able to state what he was eating, unable to recall what he told OT he wanted to do earlier during afternoon session (which was shaving). He then ambulated with RW and CGA to the w/c parked beside sink, vcs for properly transferring onto surface vs abandoning walker before turning. Noted some delayed processing with instruction following. Worked on bimanual  coordination, motor planning, and problem solving while shaving at the sink. Pt able to interchange Rt and Lt hand use when using the manual razor. Mod A for thoroughness overall due to cognitive/visual deficits and pt cutting himself once on neck. Vcs for thoroughly rinsing shaving cream off of face and for wiping down countertop/sink when he was finished. He donned 3 pillowcases onto pillows with increased time and then transferred to the recliner. Left him with all needs within reach and safety belt fastened, granddaughter just arriving for a visit.   Therapy Documentation Precautions:  Precautions Precautions: Fall Restrictions Weight Bearing Restrictions: No Pain: Pain Assessment Pain Score: 0-No pain ADL: ADL Eating: Set up Where Assessed-Eating: Edge of bed Grooming: Supervision/safety Where Assessed-Grooming: Standing at sink Upper Body Bathing: Supervision/safety Where Assessed-Upper Body Bathing: Edge of bed Lower Body Bathing: Minimal assistance Where Assessed-Lower Body Bathing: Edge of bed Upper Body Dressing: Minimal assistance Where Assessed-Upper Body Dressing: Edge of bed Lower Body Dressing: Moderate assistance Where Assessed-Lower Body Dressing: Edge of bed Toileting: Moderate assistance Where Assessed-Toileting: Glass blower/designer: Psychiatric nurse Method: Counselling psychologist: Bedside commode      Therapy/Group: Individual Therapy  Kaylean Tupou A Marlise Fahr 02/01/2020, 12:15 PM

## 2020-02-02 DIAGNOSIS — I480 Paroxysmal atrial fibrillation: Secondary | ICD-10-CM

## 2020-02-02 DIAGNOSIS — I10 Essential (primary) hypertension: Secondary | ICD-10-CM

## 2020-02-02 DIAGNOSIS — K5901 Slow transit constipation: Secondary | ICD-10-CM

## 2020-02-02 DIAGNOSIS — I6330 Cerebral infarction due to thrombosis of unspecified cerebral artery: Secondary | ICD-10-CM

## 2020-02-02 NOTE — Progress Notes (Signed)
[place] PHYSICAL MEDICINE & REHABILITATION PROGRESS NOTE   Subjective/Complaints:  No new issues. Slept well last night.   ROS: Patient denies fever, rash, sore throat, blurred vision, nausea, vomiting, diarrhea, cough, shortness of breath or chest pain, joint or back pain, headache, or mood change.   Objective:   No results found. No results for input(s): WBC, HGB, HCT, PLT in the last 72 hours. No results for input(s): NA, K, CL, CO2, GLUCOSE, BUN, CREATININE, CALCIUM in the last 72 hours.  Intake/Output Summary (Last 24 hours) at 02/02/2020 0950 Last data filed at 02/02/2020 0457 Gross per 24 hour  Intake 360 ml  Output 100 ml  Net 260 ml        Physical Exam: Vital Signs Blood pressure (!) 145/56, pulse 67, temperature 98.9 F (37.2 C), resp. rate 19, height 6\' 1"  (1.854 m), weight 99.8 kg, SpO2 97 %.  Constitutional: No distress . Vital signs reviewed. HEENT: EOMI, oral membranes moist Neck: supple Cardiovascular: RRR without murmur. No JVD    Respiratory/Chest: CTA Bilaterally without wheezes or rales. Normal effort    GI/Abdomen: BS +, non-tender, non-distended Ext: no clubbing, cyanosis, or edema Psych: pleasant and cooperative, quiet Neurological: more alert, appropriate  Mild dysarthria noted. Intentional tremors noted BUE for a few seconds this AM Moves all 4's but MMT difficult d/t inconsistent participation. Senses pain in all 4's.     Assessment/Plan: 1. 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  Physiatrist is providing close team supervision and 24 hour management of active medical problems listed below.  Physiatrist and rehab team continue to assess barriers to discharge/monitor patient progress toward functional and medical goals  Care Tool:  Bathing    Body parts bathed by patient: Right arm, Left arm,  Chest, Abdomen, Front perineal area, Right upper leg, Left upper leg, Face   Body parts bathed by helper: Buttocks, Right lower leg, Left lower leg     Bathing assist Assist Level: Minimal Assistance - Patient > 75%     Upper Body Dressing/Undressing Upper body dressing   What is the patient wearing?: Pull over shirt    Upper body assist Assist Level: Contact Guard/Touching assist    Lower Body Dressing/Undressing Lower body dressing      What is the patient wearing?: Underwear/pull up, Pants     Lower body assist Assist for lower body dressing: Minimal Assistance - Patient > 75%     Toileting Toileting    Toileting assist Assist for toileting: Maximal Assistance - Patient 25 - 49%     Transfers Chair/bed transfer  Transfers assist     Chair/bed transfer assist level: Minimal Assistance - Patient > 75%     Locomotion Ambulation   Ambulation assist      Assist level: Minimal Assistance - Patient > 75% Assistive device: Walker-rolling Max distance: 125'   Walk 10 feet activity   Assist     Assist level: Contact Guard/Touching assist Assistive device: Walker-rolling   Walk 50 feet activity   Assist    Assist level: Contact Guard/Touching assist Assistive device: Walker-rolling    Walk 150 feet activity   Assist Walk 150 feet activity did not occur: Safety/medical concerns  Assist level: Minimal Assistance - Patient > 75% Assistive device: Walker-rolling    Walk 10 feet on uneven surface  activity   Assist     Assist level: Minimal  Assistance - Patient > 75% Assistive device: Aeronautical engineer Will patient use wheelchair at discharge?: No Type of Wheelchair: Manual    Wheelchair assist level: Minimal Assistance - Patient > 75% Max wheelchair distance: 10'    Wheelchair 50 feet with 2 turns activity    Assist        Assist Level: Total Assistance - Patient < 25%   Wheelchair 150 feet  activity     Assist      Assist Level: Total Assistance - Patient < 25%   Blood pressure (!) 145/56, pulse 67, temperature 98.9 F (37.2 C), resp. rate 19, height 6\' 1"  (1.854 m), weight 99.8 kg, SpO2 97 %.    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 -patient may shower -ELOS/Goals: 9-12 days, supervision to mod I with PT/OT    2. Antithrombotics: -DVT/anticoagulation:Pharmaceutical:Other (comment)--Eliquis -antiplatelet therapy: N/A 3. Pain Management:tylenol prn for chronic LBP  10/30- pain controlled- con't tylenol prn 4. Mood:LCSW to follow for evaluation and support. -antipsychotic agents: N/A 5. Neuropsych: This patientis intermittentlycapable of making decisions onhisown behalf. 6. Skin/Wound Care:Routine pressure relief measures. 7. Fluids/Electrolytes/Nutrition:Monitor I/O. Check lytes in am.  8. Aspiration PNA?:   Augmentin for 10 days  10/28- completed Augmentin 9. HTN: Monitor BP tid--continue Norvasc, coreg bid, Cardura bid, Proscar and Hydralazine tid.   10/28-BP 156/71- somewhat elevated, but was OK yesterday- will monitor x 24 hours and see if needs ot change meds  10/29-30- BP reasonable---continue reg 10. GERD/gastric polyps: On Protonix. 11. PAF: Monitor HR tid --on coreg and Eliquis. 12.Acute on chronic anemia: Will continue to monitor H/H. Recheck CBC in am. Continue iron supplement.  10/27- Hb dropped slightly to 7.6- will recheck and monitor closely.  13. Constipation  10/28- has been 3+ days- will give Sorbitol- if doesn't work, will give Mg citrate to clean him out.   10/29- Cleaned out 14. 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   10/29- Cr up to 2.78- last was 2.64- could be previous ABX- will recheck Monday- per nursing, drinking OK.     LOS: 4 days A FACE TO [place] 02/02/2020, 9:50 AM

## 2020-02-03 ENCOUNTER — Encounter (HOSPITAL_COMMUNITY): Payer: No Typology Code available for payment source | Admitting: Occupational Therapy

## 2020-02-03 ENCOUNTER — Inpatient Hospital Stay (HOSPITAL_COMMUNITY): Payer: No Typology Code available for payment source

## 2020-02-03 NOTE — Progress Notes (Signed)
Physical Therapy Session Note  Patient Details  Name: Todd [name] MRN: [id number] Date of Birth: 01-05-1941  Today's Date: 02/03/2020 PT Individual Time: 0802-0915; 1300-1400 PT Individual Time Calculation (min): 73 min ; 60 min Short Term Goals: Week 1:  PT Short Term Goal 1 (Week 1): Patient to perform sit to stand min A PT Short Term Goal 2 (Week 1): Patient to ambulate 85' with LRAD and close S PT Short Term Goal 3 (Week 1): Patient to negotiate 8 steps wtih rail and CGA PT Short Term Goal 4 (Week 1): Patient to pick up item from floor with CGA  Skilled Therapeutic Interventions/Progress Updates:  tx 1:  Pt resting in bed.  He denied pain.  He was slightly confused, attempting to use call bell as a telephone, but realized his error.  He stated that his phone was "on the wall".  PT showed him hospital phone, but he did not want to use it.  Rolling L (as per home bed) with min assist, L side lying> sitting iwht min assist, max multimodal cues; in flat bed, no rails.  Sitting EOB, pt doffed gown, donned undershirt and shirt, with supervision.  PT dofffed non slip socks and pt donned shoes with min assist for R shoe. Sit> stand with close supervision for pulling up pants.  Standing> w/c to R with min assist, mod cues.  From w/c level,  brushed teeth with min cues for set up of items on R, and combed hair.with cue to attend to R side of back of head.  neuromuscular re-education via forced use, multimodal cues for alternating reciprocal movements bil LEs using Kinetron.  Resistance 30 cm/sec in sitting , 20 cm/sec in standing, both with bil UE support.  Min assist to fully shift to R in standing.  Pt tends to extend knee before shifting R.  Sustained self stretching R hamstrings in sitting with set up, x 3 minutes, and HC stretching via slow ankle pumps.  Gait training iwht RW x 250' on level tile.  Advanced gait training with RW, on level tile, kicking Yoga block with R/L feet, x 60'  with CGA.  No LOB; max cues for knee flexion to bring foot behind him for kicking.  At end of session, pt resting in recliner with feet elevated and seat pad alarm set.tx2:  tx 2:  Pt finishing up lunch, seated in recliner.  Pt denied pain.  Pt stated that he would like to use toilet.  Sit> stand iwht CGA to RW.  Gait in room to BR, CGA.  Min assist needed for door knob pt was attempting with his R hand  Pt stood to urinate , CGA for clothing mgt.  Continent of urine.  neuromuscular re-education via hand out, demo, and multimodal cues for standing with bil UE support on counter; 8 x 1 bil heel/toe raises, 10 x 1 mini squats, R/L hamstring curls, R/L hip abduction with neutral hip rotation.  Seated use of hoola hoop with bil hands, coordinating L/R turns.   Gait training up/down 12 steps L railing iwht CGA, self selected step -through method.  Pt had difficulty clearing R heel when descending, but took extra time to do so, without cues from PT.  Pt reported that his front entrance at home has 1+1+1 steps to enter, without rails.  To simulate this, up/down 5" high platform, using RW, with CGA,x 2 mod cues fading to 0 cues for 2nd trial.  R heel caught edge when descending, as described  above.  From standing with RW, pt retrieved 4 small cones from floor; CGA.  When asked, pt reported dizziness at first, which resolved by 3rd cone.  Gait to return to room, 100" with RW, CGA, cues for upright posture, forward gaze. wife present in room.  She stated that the front entrance to their home (1+1+1 steps) is now covered by a ramp.  At end of session, pt resting in bed , dozing off and bed alarm set.     Therapy Documentation Precautions:  Precautions Precautions: Fall Restrictions Weight Bearing Restrictions: No General:  Pain: Pain Assessment Pain Scale: 0-10 Pain Score: 0-No pain      Therapy/Group: Individual Therapy  Valery Amedee 02/03/2020, 9:30 AM

## 2020-02-03 NOTE — Progress Notes (Signed)
Occupational Therapy Session Note  Patient Details  Name: Todd [name] MRN: [id number] Date of Birth: 11/30/1940  Today's Date: 02/03/2020 OT Group Time: 1100-1200 OT Group Time Calculation (min): 60 min  Skilled Therapeutic Interventions/Progress Updates:    Pt engaged in therapeutic w/c level dance group focusing on patient choice, UE/LE strengthening, salience, activity tolerance, and social participation. Pt was guided through various dance-based exercises involving UEs/LEs and trunk. All music was selected by group members. Emphasis placed on Rt NMR, standing balance, and initiation. Pt participated well during session, vcs for initiation and sustained attention to task, vcs for sustained activity with the Rt side. He stood to dance for 1 song that he chose with unilateral support on RW and CGA. At end of session pt was returned to the room by OT.     Therapy Documentation Precautions:  Precautions Precautions: Fall Restrictions Weight Bearing Restrictions: No Pain: no c/o pain during session   ADL: ADL Eating: Set up Where Assessed-Eating: Edge of bed Grooming: Supervision/safety Where Assessed-Grooming: Standing at sink Upper Body Bathing: Supervision/safety Where Assessed-Upper Body Bathing: Edge of bed Lower Body Bathing: Minimal assistance Where Assessed-Lower Body Bathing: Edge of bed Upper Body Dressing: Minimal assistance Where Assessed-Upper Body Dressing: Edge of bed Lower Body Dressing: Moderate assistance Where Assessed-Lower Body Dressing: Edge of bed Toileting: Moderate assistance Where Assessed-Toileting: Glass blower/designer: Psychiatric nurse Method: Counselling psychologist: Bedside commode     Therapy/Group: Group Therapy  Shawan Tosh A Faustina Gebert 02/03/2020, 12:32 PM

## 2020-02-04 ENCOUNTER — Inpatient Hospital Stay (HOSPITAL_COMMUNITY): Payer: No Typology Code available for payment source | Admitting: Occupational Therapy

## 2020-02-04 ENCOUNTER — Inpatient Hospital Stay (HOSPITAL_COMMUNITY): Payer: No Typology Code available for payment source

## 2020-02-04 LAB — CBC
HCT: 22.4 % — ABNORMAL LOW (ref 39.0–52.0)
Hemoglobin: 6.9 g/dL — CL (ref 13.0–17.0)
MCH: 25.7 pg — ABNORMAL LOW (ref 26.0–34.0)
MCHC: 30.8 g/dL (ref 30.0–36.0)
MCV: 83.3 fL (ref 80.0–100.0)
Platelets: 282 10*3/uL (ref 150–400)
RBC: 2.69 MIL/uL — ABNORMAL LOW (ref 4.22–5.81)
RDW: 13.4 % (ref 11.5–15.5)
WBC: 6.7 10*3/uL (ref 4.0–10.5)
nRBC: 0 % (ref 0.0–0.2)

## 2020-02-04 LAB — BASIC METABOLIC PANEL
Anion gap: 9 (ref 5–15)
BUN: 60 mg/dL — ABNORMAL HIGH (ref 8–23)
CO2: 20 mmol/L — ABNORMAL LOW (ref 22–32)
Calcium: 8.3 mg/dL — ABNORMAL LOW (ref 8.9–10.3)
Chloride: 109 mmol/L (ref 98–111)
Creatinine, Ser: 3.47 mg/dL — ABNORMAL HIGH (ref 0.61–1.24)
GFR, Estimated: 17 mL/min — ABNORMAL LOW (ref >60)
Glucose, Bld: 178 mg/dL — ABNORMAL HIGH (ref 70–99)
Potassium: 4.4 mmol/L (ref 3.5–5.1)
Sodium: 138 mmol/L (ref 135–145)

## 2020-02-04 LAB — PREPARE RBC (CROSSMATCH)

## 2020-02-04 LAB — OCCULT BLOOD X 1 CARD TO LAB, STOOL: Fecal Occult Bld: NEGATIVE

## 2020-02-04 MED ORDER — SUCRALFATE 1 GM/10ML PO SUSP
1.0000 g | Freq: Two times a day (BID) | ORAL | Status: DC
  Administered 2020-02-04 – 2020-02-11 (×15): 1 g via ORAL
  Filled 2020-02-04 (×17): qty 10

## 2020-02-04 MED ORDER — ACETAMINOPHEN 325 MG PO TABS
650.0000 mg | ORAL_TABLET | Freq: Once | ORAL | Status: AC
  Administered 2020-02-04: 650 mg via ORAL
  Filled 2020-02-04: qty 2

## 2020-02-04 MED ORDER — DIPHENHYDRAMINE HCL 25 MG PO CAPS
25.0000 mg | ORAL_CAPSULE | Freq: Once | ORAL | Status: AC
  Administered 2020-02-04: 25 mg via ORAL
  Filled 2020-02-04: qty 1

## 2020-02-04 MED ORDER — SORBITOL 70 % SOLN
30.0000 mL | Freq: Once | Status: AC
  Administered 2020-02-04: 30 mL via ORAL
  Filled 2020-02-04: qty 30

## 2020-02-04 MED ORDER — SODIUM CHLORIDE 0.9% IV SOLUTION: Freq: Once | INTRAVENOUS | Status: AC

## 2020-02-04 NOTE — Progress Notes (Signed)
Dr. Dagoberto Ligas on floor rounding. Notified of recent hemoglobin results: 6.9.

## 2020-02-04 NOTE — Progress Notes (Signed)
[place] PHYSICAL MEDICINE & REHABILITATION PROGRESS NOTE   Subjective/Complaints:  Pt reports LBM 2+ days ago- feels a little constipated. Also really tired- explained that could be due to Hb of 6.9!    ROS:  Pt denies SOB, abd pain, CP, N/V/C/D, and vision changes    Objective:   No results found. Recent Labs    02/04/20 0700  WBC 6.7  HGB 6.9*  HCT 22.4*  PLT 282   Recent Labs    02/04/20 0700  NA 138  K 4.4  CL 109  CO2 20*  GLUCOSE 178*  BUN 60*  CREATININE 3.47*  CALCIUM 8.3*    Intake/Output Summary (Last 24 hours) at 02/04/2020 1704 Last data filed at 02/04/2020 1600 Gross per 24 hour  Intake 950 ml  Output 275 ml  Net 675 ml        Physical Exam: Vital Signs Blood pressure (!) 143/71, pulse 63, temperature 98.3 F (36.8 C), temperature source Oral, resp. rate 18, height 6\' 1"  (1.854 m), weight 98.3 kg, SpO2 100 %.  Constitutional: No distress . Vital signs reviewed. Sitting up in bed- dosing, appropriate, but poor memory, NAD HEENT: EOMI, oral membranes dry- conjunctivae very pale Neck: supple Cardiovascular: RRR    Respiratory/Chest: a little coarse- good air movement B/L   GI/Abdomen: Soft, NT, ND, (+)BS hypoactive Ext: no clubbing, cyanosis, or edema Psych: pleasant and cooperative, quiet Neurological: more alert, appropriate  Mild dysarthria noted. Intentional tremors noted BUE for a few seconds this AM Moves all 4's but MMT difficult d/t inconsistent participation. Senses pain in all 4's.     Assessment/Plan: 1. 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  Physiatrist is providing close team supervision and 24 hour management of active medical problems listed below.  Physiatrist and rehab team continue to assess barriers to discharge/monitor patient progress toward functional and medical  goals  Care Tool:  Bathing    Body parts bathed by patient: Right arm, Left arm, Chest, Abdomen, Front perineal area, Right upper leg, Left upper leg, Face   Body parts bathed by helper: Buttocks, Right lower leg, Left lower leg     Bathing assist Assist Level: Minimal Assistance - Patient > 75%     Upper Body Dressing/Undressing Upper body dressing   What is the patient wearing?: Pull over shirt    Upper body assist Assist Level: Minimal Assistance - Patient > 75% (assist to pull down in back only)    Lower Body Dressing/Undressing Lower body dressing      What is the patient wearing?: Underwear/pull up, Pants     Lower body assist Assist for lower body dressing: Minimal Assistance - Patient > 75%     Toileting Toileting    Toileting assist Assist for toileting: Minimal Assistance - Patient > 75% (urine only in standing)     Transfers Chair/bed transfer  Transfers assist     Chair/bed transfer assist level: Supervision/Verbal cueing     Locomotion Ambulation   Ambulation assist      Assist level: Contact Guard/Touching assist Assistive device: Walker-rolling Max distance: 250   Walk 10 feet activity   Assist     Assist level: Contact Guard/Touching assist Assistive device: Walker-rolling   Walk 50 feet activity   Assist    Assist level: Contact Guard/Touching assist Assistive device: Walker-rolling    Walk 150 feet activity  Assist Walk 150 feet activity did not occur: Safety/medical concerns  Assist level: Contact Guard/Touching assist Assistive device: Walker-rolling    Walk 10 feet on uneven surface  activity   Assist     Assist level: Minimal Assistance - Patient > 75% Assistive device: Aeronautical engineer Will patient use wheelchair at discharge?: No Type of Wheelchair: Manual    Wheelchair assist level: Minimal Assistance - Patient > 75% Max wheelchair distance: 10'    Wheelchair 50  feet with 2 turns activity    Assist        Assist Level: Total Assistance - Patient < 25%   Wheelchair 150 feet activity     Assist      Assist Level: Total Assistance - Patient < 25%   Blood pressure (!) 143/71, pulse 63, temperature 98.3 F (36.8 C), temperature source Oral, resp. rate 18, height 6\' 1"  (1.854 m), weight 98.3 kg, SpO2 100 %.    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 -patient may shower -ELOS/Goals: 9-12 days, supervision to mod I with PT/OT    2. Antithrombotics: -DVT/anticoagulation:Pharmaceutical:Other (comment)--Eliquis 11/1- has hx of DVT- cannot stop- Hb dropped to 6.9 -antiplatelet therapy: N/A 3. Pain Management:tylenol prn for chronic LBP  10/30- pain controlled- con't tylenol prn 4. Mood:LCSW to follow for evaluation and support. -antipsychotic agents: N/A 5. Neuropsych: This patientis intermittentlycapable of making decisions onhisown behalf. 6. Skin/Wound Care:Routine pressure relief measures. 7. Fluids/Electrolytes/Nutrition:Monitor I/O. Check lytes in am.  8. Aspiration PNA?:   Augmentin for 10 days  10/28- completed Augmentin 9. HTN: Monitor BP tid--continue Norvasc, coreg bid, Cardura bid, Proscar and Hydralazine tid.   10/28-BP 156/71- somewhat elevated, but was OK yesterday- will monitor x 24 hours and see if needs ot change meds  10/29-30- BP reasonable---continue reg 10. GERD/gastric polyps: On Protonix. 11. PAF: Monitor HR tid --on coreg and Eliquis. 12.Acute on chronic anemia: Will continue to monitor H/H. Recheck CBC in am. Continue iron supplement.  10/27- Hb dropped slightly to 7.6- will recheck and monitor closely  11/1- Hb 6.9- transfuse 1 unit pRBCs- and check occult blood. .  13. Constipation  10/28- has been 3+ days- will give Sorbitol- if doesn't work, will give Mg citrate to  clean him out.   10/29- Cleaned out  11/1- LBM 2-3 daysa go- will give Sorbitol again today 14. 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   10/29- Cr up to 2.78- last was 2.64- could be previous ABX- will recheck Monday- per nursing, drinking OK.   11/1- Cr 3.47- however Hb 6.9- will transfuse and recheck in AM- if still elevated, give IVFs tomorrow- don't think drinking well    LOS: 6 days A FACE TO FACE EVALUATION WAS PERFORMED  Todd [name] 02/04/2020, 5:04 PM

## 2020-02-04 NOTE — Progress Notes (Signed)
Stool sent for occult x1

## 2020-02-04 NOTE — Progress Notes (Signed)
Physical Therapy Session Note  Patient Details  Name: Todd [name] MRN: [id number] Date of Birth: [birth year]  Today's Date: 02/04/2020 PT Individual Time: 7096-2836 PT Individual Time Calculation (min): 45 min   Short Term Goals: Week 1:  PT Short Term Goal 1 (Week 1): Patient to perform sit to stand min A PT Short Term Goal 2 (Week 1): Patient to ambulate 91' with LRAD and close S PT Short Term Goal 3 (Week 1): Patient to negotiate 8 steps wtih rail and CGA PT Short Term Goal 4 (Week 1): Patient to pick up item from floor with CGA  Skilled Therapeutic Interventions/Progress Updates:    Patient in supine, reports slept well and wife came to visit yesterday.  Supine to sit with S and increased time HOB elevated with rail.  Min A to don pant and total A for shoes for time sake.  Patient sit to stand min A, ambulated to gym with RW and min/CGA 120'.  In parallel bars for balance and gait work stepping sideways, crossing over, forward over cones for stride length and foot clearance and side stepping over cones for clearance and coordination.  Patient ambulated 76' and negotiated steps with rails and CGA mod cues for sequencing and foot clearance and curb with RW and min A. Patient ambulated to room x 150' with CGA with RW.  Left seated in recliner with chair alarm activated and needs in reach.   Therapy Documentation Precautions:  Precautions Precautions: Fall Restrictions Weight Bearing Restrictions: No Pain: Pain Assessment Pain Score: 0-No pain    Therapy/Group: Individual Therapy  Reginia Naas  Magda Kiel, PT 02/04/2020, 8:53 AM

## 2020-02-04 NOTE — Progress Notes (Signed)
Occupational Therapy Session Note  Patient Details  Name: An Schnabel MRN: [id number] Date of Birth: April 21, 1940  Today's Date: 02/04/2020 OT Individual Time: 2841-3244 and 0102-7253 OT Individual Time Calculation (min): 50 min  and 72 min Today's Date: 02/04/2020 OT Missed Time: 25 Minutes Missed Time Reason: Nursing care (IV team)   Short Term Goals: Week 1:  OT Short Term Goal 1 (Week 1): Pt will don LB clothing with LRAD and min A OT Short Term Goal 2 (Week 1): Pt will complete sit > stand from [facility] or toilet with min A OT Short Term Goal 3 (Week 1): Pt will complete toileting tasks with CGA   Skilled Therapeutic Interventions/Progress Updates:    Pt greeted at time of session sitting up in recliner resting comfortably, agreeable to OT session. When discussing plan for the day, IV team entered and requested pt supine in bed for IV placement. Pt needed to use bathroom prior, ambulated to/from bathroom CGA with RW with Min/Mod A for sit to stand. Urine only, RW over toilet and Min/CGA for standing balance and clothing management. Ambulated to bed in same manner and Sit to supine Min A. Upon return 20 minutes later, pt sleeping but easily awoken with IV placed. Pt asked to change clothes, supine to sit EOB Min A, sit to stand Min/Mod and ambulated to sink CGA with RW. Standing oral hygiene with CGA. Doffed pants in standing, seated to fully remove from ankle level with Min A. Donned new shirt Min only to pull down in back, donned new pants Min A as well with static standing CGA to don over hips. At end of session, helped pt set up phone call with wife Magda Paganini, answered her questions as appropriate. Pt up in recliner with alarm on call bell in reach.   Session 2: Pt greeted at time of session supine in bed sleeping but easily awoken, agreeable to OT session. Per RN, ok to do therapy with blood transfusion running. However, extensive time spent at beginning of session and throughout with  occlusion/error messages and receiving assistance from RN for IV. Ambulated short distance bed > chair with CGA with RW and assist to manage IV pole. Pt performed numerous dynamic and static standing activities in room d/t IV difficulties, NMR for RUE in standing for 6:30 stand to encourage pincer grasp and [place]. 2x30 high knees with no UE support and occasional Min A for standing balance to improve weight shifting and dynamic balance, 2x30 kick outs with directions for R/L to challenge sequencing and balance. Dynamic seated ball tap for 2x20 for NMR of RUE and improve righting reactions/reaction speed. Pt up in recliner with alarm on, call bell in reach, RN going in to fix IV.   Therapy Documentation Precautions:  Precautions Precautions: Fall Restrictions Weight Bearing Restrictions: No     Therapy/Group: Individual Therapy  Viona Gilmore 02/04/2020, 12:28 PM

## 2020-02-05 ENCOUNTER — Inpatient Hospital Stay (HOSPITAL_COMMUNITY): Payer: No Typology Code available for payment source | Admitting: Physical Therapy

## 2020-02-05 ENCOUNTER — Inpatient Hospital Stay (HOSPITAL_COMMUNITY): Payer: No Typology Code available for payment source | Admitting: Occupational Therapy

## 2020-02-05 ENCOUNTER — Inpatient Hospital Stay (HOSPITAL_COMMUNITY): Payer: No Typology Code available for payment source

## 2020-02-05 LAB — BPAM RBC
Blood Product Expiration Date: 202111192359
ISSUE DATE / TIME: 202111011145
Unit Type and Rh: 7300

## 2020-02-05 LAB — BASIC METABOLIC PANEL
Anion gap: 9 (ref 5–15)
BUN: 62 mg/dL — ABNORMAL HIGH (ref 8–23)
CO2: 20 mmol/L — ABNORMAL LOW (ref 22–32)
Calcium: 8.6 mg/dL — ABNORMAL LOW (ref 8.9–10.3)
Chloride: 108 mmol/L (ref 98–111)
Creatinine, Ser: 3.28 mg/dL — ABNORMAL HIGH (ref 0.61–1.24)
GFR, Estimated: 18 mL/min — ABNORMAL LOW (ref >60)
Glucose, Bld: 204 mg/dL — ABNORMAL HIGH (ref 70–99)
Potassium: 4.5 mmol/L (ref 3.5–5.1)
Sodium: 137 mmol/L (ref 135–145)

## 2020-02-05 LAB — CBC WITH DIFFERENTIAL/PLATELET
Abs Immature Granulocytes: 0.03 10*3/uL (ref 0.00–0.07)
Basophils Absolute: 0.1 10*3/uL (ref 0.0–0.1)
Basophils Relative: 1 %
Eosinophils Absolute: 0.2 10*3/uL (ref 0.0–0.5)
Eosinophils Relative: 2 %
HCT: 26.7 % — ABNORMAL LOW (ref 39.0–52.0)
Hemoglobin: 8.4 g/dL — ABNORMAL LOW (ref 13.0–17.0)
Immature Granulocytes: 0 %
Lymphocytes Relative: 11 %
Lymphs Abs: 0.9 10*3/uL (ref 0.7–4.0)
MCH: 26.3 pg (ref 26.0–34.0)
MCHC: 31.5 g/dL (ref 30.0–36.0)
MCV: 83.7 fL (ref 80.0–100.0)
Monocytes Absolute: 0.8 10*3/uL (ref 0.1–1.0)
Monocytes Relative: 10 %
Neutro Abs: 5.8 10*3/uL (ref 1.7–7.7)
Neutrophils Relative %: 76 %
Platelets: 312 10*3/uL (ref 150–400)
RBC: 3.19 MIL/uL — ABNORMAL LOW (ref 4.22–5.81)
RDW: 13.5 % (ref 11.5–15.5)
WBC: 7.7 10*3/uL (ref 4.0–10.5)
nRBC: 0 % (ref 0.0–0.2)

## 2020-02-05 LAB — TYPE AND SCREEN
ABO/RH(D): B POS
Antibody Screen: NEGATIVE
Unit division: 0

## 2020-02-05 MED ORDER — SODIUM CHLORIDE 0.9 % IV SOLN: INTRAVENOUS | Status: AC

## 2020-02-05 NOTE — Progress Notes (Addendum)
Patient ID: Todd [name], male   DOB: 1941-01-18, 79 y.o.   MRN: [id number]  Family education scheduled Saturday 02/09/2020 1-3 PM  SW awaiting follow up of who will attend with spouse

## 2020-02-05 NOTE — Progress Notes (Signed)
Physical Therapy Session Note  Patient Details  Name: Todd [name] MRN: [id number] Date of Birth: 18-May-1940  Today's Date: 02/05/2020 PT Individual Time: 0800-0915 PT Individual Time Calculation (min): 75 min   Short Term Goals: Week 1:  PT Short Term Goal 1 (Week 1): Patient to perform sit to stand min A PT Short Term Goal 2 (Week 1): Patient to ambulate 26' with LRAD and close S PT Short Term Goal 3 (Week 1): Patient to negotiate 8 steps wtih rail and CGA PT Short Term Goal 4 (Week 1): Patient to pick up item from floor with CGA  Skilled Therapeutic Interventions/Progress Updates:    pt received in recliner and agreeable to therapy. Pt directed in x5' gait with Rolling walker to [facility] in room CGA and taken in [facility] to gym for time and energy. Pt then directed in x20 steps ups on 4" steps with BUE support at Rolling walker at [facility]. Pt then directed in NMRE activities of obstacle course of x4 cone weaving, x2 stepping over objects, and x30s stance with Rolling walker on foam pad total completed x2 and then retrieval from ground level x4 cones at CGA; 4x30s standing on foam pad without UE support with head turns in vertical and horizontal directions and then static stance CGA for static and min A intermittently for head turns; underhand bean bag throw on foam with one UE support at Rolling walker 2x15 completed at [facility]. Pt then directed in gait training with Rolling walker for 150' CGA and then gait training without AD for 150' grossly CGA however decreased cadence, decreased stride length, and increased trunk flexion noted compared to with AD, verbal cues to correct improved. Pt required sitting rest break. Then directed in lateral stepping 50' R and L directions x2 for improved hip strength and added balance challenge without UE support, with seated rest break between sets 2/2 fatigue. Pt directed in seated BLE strengthening exercises 3# 2x20 marching and LAQ for improved strength for functional activity.  Pt directed in gait training without AD for 125' at [facility] with verbal cues for increased stride length and hip flexion for limb advancement to improve stepping pattern, and trunk extension. Pt requested to return to bed at end of session, sit>supine at [facility], All needs in reach and in good condition. Call light in hand.  Alarm set. Pt denied pain at start and end of session.    Therapy Documentation Precautions:  Precautions Precautions: Fall Restrictions Weight Bearing Restrictions: No General:   Vital Signs:  Pain:      Therapy/Group: Individual Therapy  [name] 02/05/2020, 12:31 PM

## 2020-02-05 NOTE — Progress Notes (Signed)
Occupational Therapy Session Note  Patient Details  Name: Josephus Harriger MRN: [id number] Date of Birth: 1940/05/29  Today's Date: 02/05/2020 OT Individual Time: 1001-1100 OT Individual Time Calculation (min): 59 min    Short Term Goals: Week 1:  OT Short Term Goal 1 (Week 1): Pt will don LB clothing with LRAD and min A OT Short Term Goal 2 (Week 1): Pt will complete sit > stand from [facility] or toilet with min A OT Short Term Goal 3 (Week 1): Pt will complete toileting tasks with CGA   Skilled Therapeutic Interventions/Progress Updates:    Pt greeted at time of session supine in bed sleeping but easily woken up, no pain. Agreeable to shower level bathing. Bed mobility supine to sit Supervision before walking to bathroom CGA with RW, Min A for sit to stand consistently throughout session. Transfer to shower and performed bathing CGA, declined assist washing feet but would need help to reach feet. IV covered as needed for shower. Pt insisted on standing to shower, performed with CGA and cues to hold on to grab bars. Pt hesitant about TTB for his tub/shower at home but will plan to practice. Dried off in same manner. Problem solved through shower > drying > dressing at home, determined he wanted to dress in shower after dressing. UB dress CGA, LB dress Min almost CGA but feet did get slightly tangled in pant legs. Ambulated around room to sink for oral hygiene in standing and grooming, supervision. Up in recliner with alarm on, call bell in reach.   Therapy Documentation Precautions:  Precautions Precautions: Fall Restrictions Weight Bearing Restrictions: No     Therapy/Group: Individual Therapy  Viona Gilmore 02/05/2020, 11:12 AM

## 2020-02-05 NOTE — Progress Notes (Signed)
Physical Therapy Session Note  Patient Details  Name: Todd [name] MRN: [id number] Date of Birth: 04/25/1940  Today's Date: 02/05/2020 PT Individual Time: 1400-1515 PT Individual Time Calculation (min): 75 min   Short Term Goals: Week 1:  PT Short Term Goal 1 (Week 1): Patient to perform sit to stand min A PT Short Term Goal 2 (Week 1): Patient to ambulate 20' with LRAD and close S PT Short Term Goal 3 (Week 1): Patient to negotiate 8 steps wtih rail and CGA PT Short Term Goal 4 (Week 1): Patient to pick up item from floor with CGA  Skilled Therapeutic Interventions/Progress Updates:    Patient in supine asleep, but easily roused.  Reports thought he was done for the day.  Performed supine to sit with S increased time.  Patient donned shoes mod A for R LE.  Performed sit to stand to RW with CGA.  Ambulated to ortho gym x 110' RW and S to [place].  Patient performed car transfer with min A mainly for sit to stand mod cues for hand placement.  Patient with decreased safety awareness rising to get to car while PT cleaning equipment, educated to wait for assist.  PAtient ambulated to general gym x 110' with S with RW.  Seated prior to stair negotiation, cues upon standing to take walker.  Negotiated 4 steps x 3 with rails and close S to CGA cues for stepping further up on steps with R LE x 1.  Patient educated in RW use in the home and reports has 4 walkers placed near stairway, near entry to the home, etc.  Reports takes trash out every day so practiced taking liner out of trash can then tying and carrying with RW x 400' with close S.  Assist to place into laundry bag due to bag not staying open.  In parallel bars stepping over forward over cones with UE support for step length and foot clearance, then side stepping over cones with UE support and mod cues for technique for positioning of feet.  Patient in bars for trunk rotation to give and get basketball x 10 to R then 10 to L.  Ambulated without device  toss and catch ball with min A x 70'.  Patient attempted bounce and catch ball to self with CGA as leaning down to dribble ball instead.  Patient ambulated to room with RW close S.  Seated in recliner performed 3 x 10 reps hamstring curls with green t-band.  Assisted to bed and S to return to supine.  Left with needs in reach and bed alarm activated.   Therapy Documentation Precautions:  Precautions Precautions: Fall Restrictions Weight Bearing Restrictions: No Pain: Pain Assessment Pain Score: 0-No pain    Therapy/Group: Individual Therapy  Reginia Naas  [place], [place] 02/05/2020, 2:28 PM

## 2020-02-05 NOTE — Progress Notes (Signed)
Patient ID: Todd [name], male   DOB: 1940-04-16, 79 y.o.   MRN: [id number] Team Conference Report to Patient/Family  Team Conference discussion was reviewed with the patient and caregiver, including goals, any changes in plan of care and target discharge date.  Patient and caregiver express understanding and are in agreement.  The patient has a target discharge date of 02/11/20.  Dyanne Iha 02/05/2020, 12:29 PM

## 2020-02-05 NOTE — Patient Care Conference (Signed)
Inpatient RehabilitationTeam Conference and Plan of Care Update Date: 02/05/2020   Time: 11:37 AM    Patient Name: Todd [name]      Medical Record Number: [id number]  Date of Birth: Jun 27, 1940 Sex: Male         Room/Bed: 4M12C/4M12C-01 Payor Info: Payor: [employer] MEDICARE / Plan: [employer] MEDICARE CHOICE PPO / Product Type: *No Product type* /    Admit Date/Time:  01/29/2020  6:30 PM  Primary Diagnosis:  Stroke [facility])  Hospital Problems: Principal Problem:   Stroke [facility])    Expected Discharge Date: Expected Discharge Date: 02/11/20  Team Members Present: Physician leading conference: Dr. Courtney Heys Care Coodinator Present: Dorthula Nettles, RN, BSN, CRRN;Christina Sampson Goon, [place] Nurse Present: Other (comment) Samule Dry, RN) PT Present: Magda Kiel, PT OT Present: Lillia Corporal, OT PPS Coordinator present : Ileana Ladd, Burna Mortimer, SLP     Current Status/Progress Goal Weekly Team Focus  Bowel/Bladder   pt is cont of b and b lbm 11/1  remain cont of b and b  assess q shift and prn   Swallow/Nutrition/ Hydration             ADL's   Min A bathing at shower level sit<stand, Min A UB dressing, Mod A LB dressing, Min A ambulatory toilet transfers and toileting using RW, Min A shower transfers  Supervision overall  Dynamic balance, functional transfers, ADL retraining, functional cognition, motor planning   Mobility   S bed mobility, min A transfers, CGA gait with RW over 200', min/CGA with stairs, max A car transfer on eval  mod I in home environment, S car and stairs  balance, R LE NMR, gait: stride length, foot clearance; stairs, safety, fall and stroke prevention education   Communication             Safety/Cognition/ Behavioral Observations            Pain   pt has no current c/o pain  remain pain free  Assess q shift and  prn   Skin   pt has abrasion in left arm, no other skin uissues  remain free of breakdown and infection  Assess q shift and prn      Discharge Planning:  Discharging home with spouse (Supervision) Assistance from daughter and daughter in law. Had grab bars, cane and rolling walker. Bed and bath on 2nd level. (13 steps to enter 2nd level, L side railings; 2 steps toenter home)   Team Discussion: Continent bowel and bladder, takes medications fine, skin CDI, needs cueing and reminding for safety awareness. OT reports patient is min assist for UB/LB ADL's, close supervision in the room. PT reports patient is supervision to contact guard. Patient is contact guard with the stairs. MD reports patient is a poor historian, blood was given yesterday, 02/04/20, he will receive IV fluids tonight. CBG's have been elevated due to poor choices. Patient on target to meet rehab goals: yes  *See Care Plan and progress notes for long and short-term goals.   Revisions to Treatment Plan:  None at this time.  Teaching Needs: Continue with family education  Current Barriers to Discharge: Decreased caregiver support, Medical stability, Home enviroment access/layout, Weight, Weight bearing restrictions, Medication compliance and Behavior  Possible Resolutions to Barriers: Continue current medications, continue education on safety and safety awareness, Educate patient and family and proper dietary choices.     Medical Summary Current Status: Cr 3.28 (3.47)- lots of cues/reminders- poor safety awareness; Hb up to 8.4 from 6.9-  no wounds; continent B/B;  Barriers to Discharge: Decreased family/caregiver support;Behavior;Home enviroment access/layout;Medical stability;Weight;Weight bearing restrictions  Barriers to Discharge Comments: has 13 steps to go to bedroom; and 2 STE; min A OT- very slow; CGA - supervision for PT- goals Supervision- mod I d/c 11/8 Possible Resolutions to [employer] Focus: Giving IVFs o/n; Hb up to 8.4 from 6.9- BGs- somewhat elevated, but A1c 6.1- not DM yet;   Continued Need for Acute Rehabilitation Level of  Care: The patient requires daily medical management by a physician with specialized training in physical medicine and rehabilitation for the following reasons: Direction of a multidisciplinary physical rehabilitation program to maximize functional independence : Yes Medical management of patient stability for increased activity during participation in an intensive rehabilitation regime.: Yes Analysis of laboratory values and/or radiology reports with any subsequent need for medication adjustment and/or medical intervention. : Yes   I attest that I was present, lead the team conference, and concur with the assessment and plan of the team.   Cristi Loron 02/05/2020, 5:18 PM

## 2020-02-05 NOTE — Progress Notes (Signed)
Todd [name] PHYSICAL MEDICINE & REHABILITATION PROGRESS NOTE   Subjective/Complaints:  Pt reports no BM (per nurse had very large BM yesterday with sorbitol), but feeling better this AM with blood on board.   Also admits has been drinking 3 cups/water/day- explained needs to drink at least 6+ cups/water/day.    ROS:   Pt denies SOB, abd pain, CP, N/V/C/D, and vision changes Of note, poor historian  Objective:   No results found. Recent Labs    02/04/20 0700 02/05/20 0731  WBC 6.7 7.7  HGB 6.9* 8.4*  HCT 22.4* 26.7*  PLT 282 312   Recent Labs    02/04/20 0700 02/05/20 0731  NA 138 137  K 4.4 4.5  CL 109 108  CO2 20* 20*  GLUCOSE 178* 204*  BUN 60* 62*  CREATININE 3.47* 3.28*  CALCIUM 8.3* 8.6*    Intake/Output Summary (Last 24 hours) at 02/05/2020 1012 Last data filed at 02/05/2020 1443 Gross per 24 hour  Intake 1050 ml  Output 300 ml  Net 750 ml        Physical Exam: Vital Signs Blood pressure (!) 160/71, pulse 71, temperature 98 F (36.7 C), resp. rate 16, height 6\' 1"  (1.854 m), weight 101.3 kg, SpO2 95 %.  Constitutional: No distress . Vital signs reviewed. Sitting up in bedside chair, poor historian, but appropriate, NAD HEENT: EOMI, oral membranes dry- conjunctivae slightly less pale B/L Neck: supple Cardiovascular: RRR    Respiratory/Chest: CTA B/L- no W/R/R- good air movement   GI/Abdomen: Soft, NT, ND, (+)BS  Ext: no clubbing, cyanosis, or edema Psych: pleasant, but answers, doesn't ask questions.  Neurological: more alert, appropriate  Mild dysarthria noted. Intentional tremors noted BUE for a few seconds this AM Moves all 4's but MMT difficult d/t inconsistent participation. Senses pain in all 4's.     Assessment/Plan: 1. 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  Physiatrist is providing close team  supervision and 24 hour management of active medical problems listed below.  Physiatrist and rehab team continue to assess barriers to discharge/monitor patient progress toward functional and medical goals  Care Tool:  Bathing    Body parts bathed by patient: Right arm, Left arm, Chest, Abdomen, Front perineal area, Right upper leg, Left upper leg, Face   Body parts bathed by helper: Buttocks, Right lower leg, Left lower leg     Bathing assist Assist Level: Minimal Assistance - Patient > 75%     Upper Body Dressing/Undressing Upper body dressing   What is the patient wearing?: Pull over shirt    Upper body assist Assist Level: Minimal Assistance - Patient > 75% (assist to pull down in back only)    Lower Body Dressing/Undressing Lower body dressing      What is the patient wearing?: Underwear/pull up, Pants     Lower body assist Assist for lower body dressing: Minimal Assistance - Patient > 75%     Toileting Toileting    Toileting assist Assist for toileting: Minimal Assistance - Patient > 75% (urine only in standing)     Transfers Chair/bed transfer  Transfers assist     Chair/bed transfer assist level: Contact Guard/Touching assist     Locomotion Ambulation   Ambulation assist      Assist level: Contact Guard/Touching assist Assistive device: Walker-rolling Max distance: 150'   Walk 10 feet activity   Assist  Assist level: Contact Guard/Touching assist Assistive device: Walker-rolling   Walk 50 feet activity   Assist    Assist level: Contact Guard/Touching assist Assistive device: Walker-rolling    Walk 150 feet activity   Assist Walk 150 feet activity did not occur: Safety/medical concerns  Assist level: Contact Guard/Touching assist Assistive device: Walker-rolling    Walk 10 feet on uneven surface  activity   Assist     Assist level: Minimal Assistance - Patient > 75% Assistive device: Horticulturist, commercial Will patient use wheelchair at discharge?: No Type of Wheelchair: Manual    Wheelchair assist level: Minimal Assistance - Patient > 75% Max wheelchair distance: 10'    Wheelchair 50 feet with 2 turns activity    Assist        Assist Level: Total Assistance - Patient < 25%   Wheelchair 150 feet activity     Assist      Assist Level: Total Assistance - Patient < 25%   Blood pressure (!) 160/71, pulse 71, temperature 98 F (36.7 C), resp. rate 16, height 6\' 1"  (1.854 m), weight 101.3 kg, SpO2 95 %.    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 -patient may shower -ELOS/Goals: 9-12 days, supervision to mod I with PT/OT    2. Antithrombotics: -DVT/anticoagulation:Pharmaceutical:Other (comment)--Eliquis 11/1- has hx of DVT- cannot stop- Hb dropped to 6.9 11/2- will monitor Hb since on Eliquis/gastric polyps could be cause.  -antiplatelet therapy: N/A 3. Pain Management:tylenol prn for chronic LBP  10/30- pain controlled- con't tylenol prn 4. Mood:LCSW to follow for evaluation and support. -antipsychotic agents: N/A 5. Neuropsych: This patientis intermittentlycapable of making decisions onhisown behalf. 6. Skin/Wound Care:Routine pressure relief measures. 7. Fluids/Electrolytes/Nutrition:Monitor I/O. Check lytes in am.  8. Aspiration PNA?:   Augmentin for 10 days  10/28- completed Augmentin 9. HTN: Monitor BP tid--continue Norvasc, coreg bid, Cardura bid, Proscar and Hydralazine tid.   10/28-BP 156/71- somewhat elevated, but was OK yesterday- will monitor x 24 hours and see if needs ot change meds  10/29-30- BP reasonable---continue reg 10. GERD/gastric polyps: On Protonix. 11. PAF: Monitor HR tid --on coreg and Eliquis. 12.Acute on chronic anemia: Will continue to monitor H/H. Recheck CBC in am. Continue  iron supplement.  10/27- Hb dropped slightly to 7.6- will recheck and monitor closely  11/1- Hb 6.9- transfuse 1 unit pRBCs- and check occult blood. Marland Kitchen   11/2- Hb up to 8.4- also helped his Cr 13. Constipation  10/28- has been 3+ days- will give Sorbitol- if doesn't work, will give Mg citrate to clean him out.   10/29- Cleaned out  11/1- LBM 2-3 daysa go- will give Sorbitol again today  11/2- very large BM yesterday- pt unaware of going, of note.  14. 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   10/29- Cr up to 2.78- last was 2.64- could be previous ABX- will recheck Monday- per nursing, drinking OK.   11/1- Cr 3.47- however Hb 6.9- will transfuse and recheck in AM- if still elevated, give IVFs tomorrow- don't think drinking well  11/2- Cr down to 3.28- from 3.47- will give IVFs NS 75cc/hour from 4pm this afternoon to 8am tomorrow AM to avoid giving with therapy- and recheck in AM    LOS: 7 days A FACE TO FACE EVALUATION WAS PERFORMED  Todd [name] 02/05/2020, 10:12 AM

## 2020-02-06 ENCOUNTER — Inpatient Hospital Stay (HOSPITAL_COMMUNITY): Payer: No Typology Code available for payment source | Admitting: Occupational Therapy

## 2020-02-06 ENCOUNTER — Inpatient Hospital Stay (HOSPITAL_COMMUNITY): Payer: No Typology Code available for payment source

## 2020-02-06 LAB — BASIC METABOLIC PANEL
Anion gap: 10 (ref 5–15)
BUN: 64 mg/dL — ABNORMAL HIGH (ref 8–23)
CO2: 18 mmol/L — ABNORMAL LOW (ref 22–32)
Calcium: 8.2 mg/dL — ABNORMAL LOW (ref 8.9–10.3)
Chloride: 108 mmol/L (ref 98–111)
Creatinine, Ser: 3.25 mg/dL — ABNORMAL HIGH (ref 0.61–1.24)
GFR, Estimated: 18 mL/min — ABNORMAL LOW (ref >60)
Glucose, Bld: 217 mg/dL — ABNORMAL HIGH (ref 70–99)
Potassium: 4.5 mmol/L (ref 3.5–5.1)
Sodium: 136 mmol/L (ref 135–145)

## 2020-02-06 NOTE — Progress Notes (Signed)
Patient ID: Todd [name], male   DOB: October 13, 1940, 79 y.o.   MRN: [id number]  TTB ordered through [facility]

## 2020-02-06 NOTE — Progress Notes (Signed)
Physical Therapy Weekly Progress Note  Patient Details  Name: Todd [name] MRN: [id number] Date of Birth: 07/03/1940  Beginning of progress report period: January 30, 2020 End of progress report period: February 06, 2020  Today's Date: 02/06/2020 PT Individual Time:Session1: 1100-1200; [id number] PT Individual Time Calculation (min): 60 min & 70 min  Patient has met 4 of 4 short term goals.  Patient making excellent progress toward mod I LTG's for in home mobility excepts S will be needed for stairs.  He has much improved coordination for bed mobility and car transfers as well though focus of goals for this week on steps, transfers and gait.    Patient continues to demonstrate the following deficits abnormal tone and decreased coordination and decreased attention and decreased problem solving and therefore will continue to benefit from skilled PT intervention to increase functional independence with mobility.  Patient progressing toward long term goals..  Continue plan of care.  PT Short Term Goals Week 1:  PT Short Term Goal 1 (Week 1): Patient to perform sit to stand min A PT Short Term Goal 1 - Progress (Week 1): Met PT Short Term Goal 2 (Week 1): Patient to ambulate 39' with LRAD and close S PT Short Term Goal 2 - Progress (Week 1): Met PT Short Term Goal 3 (Week 1): Patient to negotiate 8 steps wtih rail and CGA PT Short Term Goal 3 - Progress (Week 1): Met PT Short Term Goal 4 (Week 1): Patient to pick up item from floor with CGA PT Short Term Goal 4 - Progress (Week 1): Met Week 2:  PT Short Term Goal 2 (Week 2): STG = LTG due to ELOS  Skilled Therapeutic Interventions/Progress Updates:    Session1:   Patient in recliner in room.  Assist to don shoes, noted swelling in lower legs/feet.  Patient sit to stand in room with CGA increased time.  Patient ambulated x 150' with S to [place].  Seated on mat in gym.  Sit to stand S and performed standing reaching with R UE for horseshoes  and tossing to staub with R then L hand.  Reaching to floor to retrieve horse shoes with R hand stabilizing on RW and L reaching to floor with CGA for safety.  Patient ambulated x 270' with RW level tile and through obstacles over compliant surface with mod cues and min A and over sticks, stepping up onto 4" step with HHA min A and negotiated 12 steps with rails and close S mod cues for safety with foot placement.  Patient in dayroom practicing walking with RW around furniture in tight space and transfers on variety of surfaces with and without armrest.  Patient on mat with S for sit to side and rolling to back with towel roll at spine and arms at sides with scapular squeezes x 10 for postural re-ed/strengthening.  Patient ambulated 20' holding physioball with min a and rolling ball up the wall with min A for R UE to increase flexion.  Patient ambulated to room x 200' with RW and close S to CGA, left up in recliner with chair alarm activated and needs in reach.   Session2: Patient in recliner, reports needing to toilet.  CGA to stand and ambulate with RW to bathroom.  Standing to toilet with close S.  Patient ambulated x 220' with CGA with RW and cues for R attention around obstacles.  Patient standing CGA no UE support to toss bags for corn hole.  Patient standing with UE  support while PT obtained bags, for 2 x 3 bouts.  Standing with L foot on 4" step for R side weight bearing balance while playing Jenga with mod cues for technique.  Patient on Nu Step for UE/LE x 7 min @ level 4. Patient ambulated back to room with close S to CGA with RW x 220'.  Sit to supine in bed with S and cues for adjusting positioning in bed.  Patient left with call bell in reach and bed alarm activated.   Therapy Documentation Precautions:  Precautions Precautions: Fall Restrictions Weight Bearing Restrictions: No Pain: Pain Assessment Pain Scale: 0-10 Pain Score: 0-No pain   Therapy/Group: Individual Therapy  Reginia Naas  Magda Kiel, PT 02/06/2020, 11:10 AM

## 2020-02-06 NOTE — Progress Notes (Signed)
[facility] PHYSICAL MEDICINE & REHABILITATION PROGRESS NOTE   Subjective/Complaints:  Todd [name] reports doing well- admits he was supposed to drink at least 6+ cups/day- remembers our discussion from yesterday.  Also s/p breakfast- has a lot of spills on shirt.    ROS:   Todd [name] denies SOB, abd pain, CP, N/V/C/D, and vision changes   Objective:   No results found. Recent Labs    02/04/20 0700 02/05/20 0731  WBC 6.7 7.7  HGB 6.9* 8.4*  HCT 22.4* 26.7*  PLT 282 312   Recent Labs    02/05/20 0731 02/06/20 0808  NA 137 136  K 4.5 4.5  CL 108 108  CO2 20* 18*  GLUCOSE 204* 217*  BUN 62* 64*  CREATININE 3.28* 3.25*  CALCIUM 8.6* 8.2*    Intake/Output Summary (Last 24 hours) at 02/06/2020 0909 Last data filed at 02/06/2020 0535 Gross per 24 hour  Intake 913.63 ml  Output 575 ml  Net 338.63 ml        Physical Exam: Vital Signs Blood pressure 137/65, pulse 62, temperature 97.6 F (36.4 C), temperature source Oral, resp. rate 16, height 6\' 1"  (1.854 m), weight 101.3 kg, SpO2 98 %.  Constitutional: No distress . Vital signs reviewed. Sitting up in bed- sitting up at 90 degrees, NAD HEENT: EOMI, oral membranes dry- conjunctivae slightly less pale B/L Neck: supple Cardiovascular: RRR   Respiratory/Chest: CTA B/L- no W/R/R- good air movement GI/Abdomen: Soft, NT, ND, (+)BS  Ext: no clubbing, cyanosis, or edema Psych: pleasant, but answers, doesn't ask questions, still  Neurological: more alert, appropriate  Mild dysarthria noted. Intentional tremors noted BUE for a few seconds this AM Moves all 4's but MMT difficult d/t inconsistent participation. Senses pain in all 4's.     Assessment/Plan: 1. 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  Physiatrist is providing close team supervision and 24 hour management of active medical problems  listed below.  Physiatrist and rehab team continue to assess barriers to discharge/monitor patient progress toward functional and medical goals  Care Tool:  Bathing    Body parts bathed by patient: Right arm, Left arm, Chest, Abdomen, Front perineal area, Right upper leg, Left upper leg, Face, Buttocks, Right lower leg, Left lower leg   Body parts bathed by helper: Right lower leg, Left lower leg     Bathing assist Assist Level: Minimal Assistance - Patient > 75%     Upper Body Dressing/Undressing Upper body dressing   What is the patient wearing?: Pull over shirt    Upper body assist Assist Level: Contact Guard/Touching assist    Lower Body Dressing/Undressing Lower body dressing      What is the patient wearing?: Pants     Lower body assist Assist for lower body dressing: Minimal Assistance - Patient > 75%     Toileting Toileting    Toileting assist Assist for toileting: Minimal Assistance - Patient > 75% (urine only in standing)     Transfers Chair/bed transfer  Transfers assist     Chair/bed transfer assist level: Contact Guard/Touching assist     Locomotion Ambulation   Ambulation assist      Assist level: Contact Guard/Touching assist Assistive device: Walker-rolling Max distance: 150'   Walk 10 feet activity   Assist     Assist level: Supervision/Verbal cueing Assistive device: Walker-rolling   Walk 50 feet activity   Assist  Assist level: Supervision/Verbal cueing Assistive device: Walker-rolling    Walk 150 feet activity   Assist Walk 150 feet activity did not occur: Safety/medical concerns  Assist level: Contact Guard/Touching assist Assistive device: Walker-rolling    Walk 10 feet on uneven surface  activity   Assist     Assist level: Contact Guard/Touching assist Assistive device: Walker-rolling   Wheelchair     Assist Will patient use wheelchair at discharge?: No Type of Wheelchair: Manual     Wheelchair assist level: Minimal Assistance - Patient > 75% Max wheelchair distance: 10'    Wheelchair 50 feet with 2 turns activity    Assist        Assist Level: Total Assistance - Patient < 25%   Wheelchair 150 feet activity     Assist      Assist Level: Total Assistance - Patient < 25%   Blood pressure 137/65, pulse 62, temperature 97.6 F (36.4 C), temperature source Oral, resp. rate 16, height 6\' 1"  (1.854 m), weight 101.3 kg, SpO2 98 %.    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 -patient may shower -ELOS/Goals: 9-12 days, supervision to mod I with Todd [name]/OT    2. Antithrombotics: -DVT/anticoagulation:Pharmaceutical:Other (comment)--Eliquis 11/1- has hx of DVT- cannot stop- Hb dropped to 6.9 11/2- will monitor Hb since on Eliquis/gastric polyps could be cause.  -antiplatelet therapy: N/A 3. Pain Management:tylenol prn for chronic LBP  10/30- pain controlled- con't tylenol prn 4. Mood:LCSW to follow for evaluation and support. -antipsychotic agents: N/A 5. Neuropsych: This patientis intermittentlycapable of making decisions onhisown behalf. 6. Skin/Wound Care:Routine pressure relief measures. 7. Fluids/Electrolytes/Nutrition:Monitor I/O. Check lytes in am.  8. Aspiration PNA?:   Augmentin for 10 days  10/28- completed Augmentin 9. HTN: Monitor BP tid--continue Norvasc, coreg bid, Cardura bid, Proscar and Hydralazine tid.   10/28-BP 156/71- somewhat elevated, but was OK yesterday- will monitor x 24 hours and see if needs ot change meds  10/29-30- BP reasonable---continue reg  11/3- BP controlled- con't regimen 10. GERD/gastric polyps: On Protonix. 11. PAF: Monitor HR tid --on coreg and Eliquis. 12.Acute on chronic anemia: Will continue to monitor H/H. Recheck CBC in am. Continue iron supplement.  10/27- Hb dropped slightly  to 7.6- will recheck and monitor closely  11/1- Hb 6.9- transfuse 1 unit pRBCs- and check occult blood. Marland Kitchen   11/2- Hb up to 8.4- also helped his Cr 13. Constipation  10/28- has been 3+ days- will give Sorbitol- if doesn't work, will give Mg citrate to clean him out.   10/29- Cleaned out  11/1- LBM 2-3 daysa go- will give Sorbitol again today  11/2- very large BM yesterday- Todd [name] unaware of going, of note.  14. 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   10/29- Cr up to 2.78- last was 2.64- could be previous ABX- will recheck Monday- per nursing, drinking OK.   11/1- Cr 3.47- however Hb 6.9- will transfuse and recheck in AM- if still elevated, give IVFs tomorrow- don't think drinking well  11/2- Cr down to 3.28- from 3.47- will give IVFs NS 75cc/hour from 4pm this afternoon to 8am tomorrow AM to avoid giving with therapy- and recheck in AM  11/3- S/p IVFs- Cr didn't move basically at all- BUN also stable after IVFs. - remembers to drink 6 cups/day; pushing PO fluids- appears that is at new baseline Cr- low 3's.     LOS: 8 days A FACE TO FACE EVALUATION WAS PERFORMED  Micaylah Bertucci 02/06/2020, 9:09 AM

## 2020-02-06 NOTE — Progress Notes (Signed)
Occupational Therapy Weekly Progress Note  Patient Details  Name: Todd [name] MRN: [id number] Date of Birth: 09-25-1940  Beginning of progress report period: January 30, 2020 End of progress report period: February 06, 2020  Today's Date: 02/06/2020 OT Individual Time: 0160-1093 OT Individual Time Calculation (min): 59 min    Patient has met 3 of 3 short term goals.  Pt is overall CGA with RW for ADL transfers to/from toilet and various other surfaces. Pt requires CGA for toileting tasks for safety and Min A for LB dressing with assist to thread RLE and occassionally don over R hip. Pt performs shower level bathing with sit to stands with CGA, occassional Min A for LB. Pt performs tasks with extended amount of time, continues to have decreased safety awareness and sequencing. Family ed is planned prior to DC home.   Patient continues to demonstrate the following deficits: muscle weakness, decreased cardiorespiratoy endurance, decreased coordination, decreased motor planning and bradykinesia  and decreased standing balance, hemiplegia and decreased balance strategies and therefore will continue to benefit from skilled OT intervention to enhance overall performance with BADL and Reduce care partner burden.  Patient not progressing toward long term goals.  See goal revision..  Plan of care revisions: Pt downgraded from Mod I to Supervision level as pt will need Supervision for safety at home.  OT Short Term Goals Week 1:  OT Short Term Goal 1 (Week 1): Pt will don LB clothing with LRAD and min A OT Short Term Goal 1 - Progress (Week 1): Met OT Short Term Goal 2 (Week 1): Pt will complete sit > stand from [facility] or toilet with min A OT Short Term Goal 2 - Progress (Week 1): Met OT Short Term Goal 3 (Week 1): Pt will complete toileting tasks with CGA OT Short Term Goal 3 - Progress (Week 1): Met Week 2:  OT Short Term Goal 1 (Week 2): STGs = LTGs d/t ELOS at Supervision  Skilled Therapeutic  Interventions/Progress Updates:     Pt greeted at time of session sleeping but easily awoken, agreeable to OT session and RN detaching IV. No c/o pain throughout session. Bed mobility from flat bed surface Supervision with pt using bed rail L hand only to reach, once seated sit to stand Min A, ambulated to bathroom CGA with RW as he stated he was "wet" and needed to change. Transferred to [facility] over toilet with CGA and doffed clothing prior to sitting with Min for pants and assist for wet brief d/t moisture and sticking. Provided pt with wet washclothes and washed periarea, assist with buttocks in standing for thoroughness. Donned new brief from home like underwear and pants Min A overall with assist to don over R hip and thread R foot. UB dress Min A today as he had a tight shirt. Ambulated short distance to sink level with CGA with RW, oral hygiene in standing Supervision, shaving seated with cues to attend to R side of face and min A for thoroughness. Walked short distance to recliner CGA with RW, cues to remember to use RW throughout session. Set up with alarm on, call bell in reach.    Therapy Documentation Precautions:  Precautions Precautions: Fall Restrictions Weight Bearing Restrictions: No     Therapy/Group: Individual Therapy  Viona Gilmore 02/06/2020, 10:33 AM

## 2020-02-07 ENCOUNTER — Inpatient Hospital Stay (HOSPITAL_COMMUNITY): Payer: No Typology Code available for payment source | Admitting: Occupational Therapy

## 2020-02-07 ENCOUNTER — Inpatient Hospital Stay (HOSPITAL_COMMUNITY): Payer: No Typology Code available for payment source

## 2020-02-07 ENCOUNTER — Inpatient Hospital Stay (HOSPITAL_COMMUNITY): Payer: No Typology Code available for payment source | Admitting: Physical Therapy

## 2020-02-07 NOTE — Discharge Summary (Signed)
Occupational Therapy Discharge Summary  Patient Details  Name: Todd [name] MRN: [id number] Date of Birth: 1941/02/21   Patient has met 6 of 8 long term goals due to improved activity tolerance, improved balance, postural control, ability to compensate for deficits, functional use of  RIGHT upper extremity, improved attention, improved awareness and improved coordination.  Patient to discharge at overall Supervision level.  Patient's care partner is independent to provide the necessary physical and cognitive assistance at discharge. Pt has improved use of RUE but continues to have limited shoulder flexion/abduction and some R inattention. Pt continues to exhibit slow processing and decreased safety awareness during ADLs, especially for dynamic standing self care tasks needing supervision for safety. Family ed completed on 11/6 with sister Dot and spouse Magda Paganini.   2 goals were unable to be met due to pt requiring supervision-Min A for UB/LB dressing respectively.   Recommendation:  Patient will benefit from ongoing skilled OT services in home health setting to continue to advance functional skills in the area of BADL and Reduce care partner burden.  Equipment: TTB  Reasons for discharge: treatment goals met and discharge from hospital  Patient/family agrees with progress made and goals achieved: Yes  OT Discharge    ADL Eating: Set up Where Assessed-Eating: Edge of bed Grooming: Supervision/safety Where Assessed-Grooming: Standing at sink Upper Body Bathing: Setup Where Assessed-Upper Body Bathing: Shower Lower Body Bathing: Supervision/safety Where Assessed-Lower Body Bathing: Shower Upper Body Dressing: Supervision/safety Where Assessed-Upper Body Dressing: Edge of bed Lower Body Dressing: Minimal assistance Where Assessed-Lower Body Dressing: Edge of bed Toileting: Supervision/safety Where Assessed-Toileting: Glass blower/designer: Close supervision Toilet Transfer Method:  Counselling psychologist: Radiographer, therapeutic: Close supervison Insurance underwriter: Gaffer Baseline Vision/History: Wears glasses Wears Glasses: Reading only Patient Visual Report: No change from baseline Cognition Overall Cognitive Status: Impaired/Different from baseline Arousal/Alertness: Awake/alert Memory: Impaired Awareness: Appears intact Problem Solving: Impaired Safety/Judgment: Appears intact Comments: Pt with slow processing and requires extended time to complete tasks Sensation Sensation Light Touch: Appears Intact Coordination Gross Motor Movements are Fluid and Coordinated: No Fine Motor Movements are Fluid and Coordinated: No Coordination and Movement Description: slowed movements in UE/LE's Motor  Motor Motor: Abnormal postural alignment and control Motor - Skilled Clinical Observations: rounded shoulders and poor posture Mobility  Transfers Sit to Stand: Supervision/Verbal cueing Stand to Sit: Supervision/Verbal cueing  Trunk/Postural Assessment  Cervical Assessment Cervical Assessment: Exceptions to [facility] Thoracic Assessment Thoracic Assessment: Exceptions to [facility] Lumbar Assessment Lumbar Assessment: Exceptions to [facility] Postural Control Postural Control: Within Functional Limits Protective Responses: delayed during ADls and functional tasks Postural Limitations: kyphosis, forward head, stiffness throughout  Balance Balance Balance Assessed: Yes Static Sitting Balance Static Sitting - Balance Support: No upper extremity supported;Feet supported Static Sitting - Level of Assistance: 6: Modified independent (Device/Increase time) Dynamic Sitting Balance Dynamic Sitting - Balance Support: No upper extremity supported Dynamic Sitting - Level of Assistance: 6: Modified independent (Device/Increase time) Static Standing Balance Static Standing - Balance Support: During functional activity;Left upper extremity  supported Static Standing - Level of Assistance: 5: Stand by assistance Dynamic Standing Balance Dynamic Standing - Balance Support: During functional activity;Left upper extremity supported Dynamic Standing - Level of Assistance: 5: Stand by assistance Extremity/Trunk Assessment RUE Assessment RUE Assessment: Exceptions to [facility] General Strength Comments: intention tremor present, limited shoulder flexion RUE Body System: Neuro Brunstrum levels for arm and hand: Arm;Hand Brunstrum level for arm: Stage V Relative Independence from [employer] Brunstrum level for hand:  Stage VI Isolated joint movements LUE Assessment LUE Assessment: Exceptions to [facility] General Strength Comments: 4-/5 overall   Viona Gilmore 02/07/2020, 6:03 PM

## 2020-02-07 NOTE — Progress Notes (Addendum)
[facility] PHYSICAL MEDICINE & REHABILITATION PROGRESS NOTE   Subjective/Complaints:   Pt reports doing well- has no complaints- can remember to drink more again today.    ROS:   Pt denies SOB, abd pain, CP, N/V/C/D, and vision changes   Objective:   No results found. Recent Labs    02/05/20 0731  WBC 7.7  HGB 8.4*  HCT 26.7*  PLT 312   Recent Labs    02/05/20 0731 02/06/20 0808  NA 137 136  K 4.5 4.5  CL 108 108  CO2 20* 18*  GLUCOSE 204* 217*  BUN 62* 64*  CREATININE 3.28* 3.25*  CALCIUM 8.6* 8.2*    Intake/Output Summary (Last 24 hours) at 02/07/2020 0842 Last data filed at 02/07/2020 0800 Gross per 24 hour  Intake 682 ml  Output 575 ml  Net 107 ml        Physical Exam: Vital Signs Blood pressure (!) 155/70, pulse 70, temperature 98 F (36.7 C), temperature source Oral, resp. rate 17, height 6\' 1"  (1.854 m), weight 101.3 kg, SpO2 99 %.  Constitutional: No distress . Vital signs reviewed. Sitting up in bed- snoozing, upright, NAD HEENT: EOMI, oral membranes still dry Neck: supple Cardiovascular: RRR   Respiratory/Chest: CTA B/L- no W/R/R- good air movement GI/Abdomen: Soft, NT, ND, (+)BS  Ext: B/L LE's- 1-2+ pitting edema to mid calf B/L Psych: pleasant, introvert? Neurological: more alert, appropriate  Mild dysarthria noted. Intentional tremors noted BUE for a few seconds this AM Moves all 4's but MMT difficult d/t inconsistent participation. Senses pain in all 4's.     Assessment/Plan: 1. 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  Physiatrist is providing close team supervision and 24 hour management of active medical problems listed below.  Physiatrist and rehab team continue to assess barriers to discharge/monitor patient progress toward functional and medical goals  Care Tool:  Bathing    Body parts  bathed by patient: Right arm, Left arm, Chest, Abdomen, Front perineal area, Right upper leg, Left upper leg, Face, Buttocks, Right lower leg, Left lower leg   Body parts bathed by helper: Right lower leg, Left lower leg     Bathing assist Assist Level: Minimal Assistance - Patient > 75%     Upper Body Dressing/Undressing Upper body dressing   What is the patient wearing?: Pull over shirt    Upper body assist Assist Level: Minimal Assistance - Patient > 75% (tight fitting)    Lower Body Dressing/Undressing Lower body dressing      What is the patient wearing?: Pants, Underwear/pull up     Lower body assist Assist for lower body dressing: Minimal Assistance - Patient > 75%     Toileting Toileting    Toileting assist Assist for toileting: Contact Guard/Touching assist     Transfers Chair/bed transfer  Transfers assist     Chair/bed transfer assist level: Contact Guard/Touching assist     Locomotion Ambulation   Ambulation assist      Assist level: Contact Guard/Touching assist Assistive device: Walker-rolling Max distance: 220   Walk 10 feet activity   Assist     Assist level: Supervision/Verbal cueing Assistive device: Walker-rolling   Walk 50 feet activity   Assist    Assist level: Supervision/Verbal cueing Assistive device: Walker-rolling    Walk 150 feet activity   Assist Walk 150 feet activity did not occur: Safety/medical concerns  Assist level: Contact Guard/Touching assist Assistive device: Walker-rolling    Walk 10 feet on uneven surface  activity   Assist     Assist level: Contact Guard/Touching assist Assistive device: Aeronautical engineer Will patient use wheelchair at discharge?: No Type of Wheelchair: Manual    Wheelchair assist level: Minimal Assistance - Patient > 75% Max wheelchair distance: 10'    Wheelchair 50 feet with 2 turns activity    Assist        Assist Level: Total  Assistance - Patient < 25%   Wheelchair 150 feet activity     Assist      Assist Level: Total Assistance - Patient < 25%   Blood pressure (!) 155/70, pulse 70, temperature 98 F (36.7 C), temperature source Oral, resp. rate 17, height 6\' 1"  (1.854 m), weight 101.3 kg, SpO2 99 %.    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 -patient may shower -ELOS/Goals: 9-12 days, supervision to mod I with PT/OT    2. Antithrombotics: -DVT/anticoagulation:Pharmaceutical:Other (comment)--Eliquis 11/1- has hx of DVT- cannot stop- Hb dropped to 6.9 11/4- Hb up to 8.4- will monitor -antiplatelet therapy: N/A 3. Pain Management:tylenol prn for chronic LBP  10/30- pain controlled- con't tylenol prn 4. Mood:LCSW to follow for evaluation and support. -antipsychotic agents: N/A 5. Neuropsych: This patientis intermittentlycapable of making decisions onhisown behalf. 6. Skin/Wound Care:Routine pressure relief measures. 7. Fluids/Electrolytes/Nutrition:Monitor I/O. Check lytes in am.  8. Aspiration PNA?:   Augmentin for 10 days  10/28- completed Augmentin 9. HTN: Monitor BP tid--continue Norvasc, coreg bid, Cardura bid, Proscar and Hydralazine tid.   10/28-BP 156/71- somewhat elevated, but was OK yesterday- will monitor x 24 hours and see if needs ot change meds  11/4- BP 155/70- slightly elevated, but isn't usually- will con't 10. GERD/gastric polyps: On Protonix. 11. PAF: Monitor HR tid --on coreg and Eliquis. 12.Acute on chronic anemia: Will continue to monitor H/H. Recheck CBC in am. Continue iron supplement.  10/27- Hb dropped slightly to 7.6- will recheck and monitor closely  11/1- Hb 6.9- transfuse 1 unit pRBCs- and check occult blood. Marland Kitchen   11/2- Hb up to 8.4- also helped his Cr  11/4- will recheck on monday 13. Constipation  10/28- has been 3+ days- will  give Sorbitol- if doesn't work, will give Mg citrate to clean him out.   10/29- Cleaned out  11/1- LBM 2-3 daysa go- will give Sorbitol again today  11/2- very large BM yesterday- pt unaware of going, of note.   11/4- LBM yesterday 14. 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   10/29- Cr up to 2.78- last was 2.64- could be previous ABX- will recheck Monday- per nursing, drinking OK.   11/1- Cr 3.47- however Hb 6.9- will transfuse and recheck in AM- if still elevated, give IVFs tomorrow- don't think drinking well  11/2- Cr down to 3.28- from 3.47- will give IVFs NS 75cc/hour from 4pm this afternoon to 8am tomorrow AM to avoid giving with therapy- and recheck in AM  11/3- S/p IVFs- Cr didn't move basically at all- BUN also stable after IVFs. - remembers to drink 6 cups/day; pushing PO fluids- appears that is at new baseline Cr- low 3's. 15. LE edema  11/4- will order TEDs for LE edema- and monitor   Spoke to wife at length- pt AND wife do NOT want any DM meds at all- he's had too many issues  with hypoglycemia in past to want to take anything- also don't want to change from heart healthy diet to diabetic diet- no changes will be made/ no addition of DM meds with be made, regardless, per wife.   LOS: 9 days A FACE TO FACE EVALUATION WAS PERFORMED  Todd [name] 02/07/2020, 8:42 AM

## 2020-02-07 NOTE — Progress Notes (Signed)
Physical Therapy Session Note  Patient Details  Name: Todd [name] MRN: [id number] Date of Birth: [birth year]  Today's Date: 02/07/2020 PT Individual Time: 1555-1650 PT Individual Time Calculation (min): 55 min   Short Term Goals: Week 2:  PT Short Term Goal 2 (Week 2): STG = LTG due to ELOS  Skilled Therapeutic Interventions/Progress Updates:    Pt received sitting in recliner with his brother exiting upon therapist arrival. Pt agreeable to therapy session. Sit>stand recliner>RW with CGA/close supervision for safety - pt noted to always turn towards L when turning to sit in a chair (possible R inattention or feels more unsteady turning that direction) Gait ~10ft in bathroom using RW with CGA for steadying - demos decreased B LE step length and foot clearance. Standing with CGA for steadying performed LB clothing management and continent of bladder without assist. Gait ~81ft to sink using RW with pt demoing poor AD management attempting to place it off to the side when walking up to sink counter (cuing and education for correction) and then when using RW demos impaired R attention often bumping the front R wheel into objects in environment. Pt noted to have B LE edema. Retrieved and donned large, long knee high TED hose but they were tight - asked secretary to order XL - educated pt on removing TED hose at night and keeping feet elevated during the day. Gait training ~245ft to main gym then to ADL apartment using RW with CGA for steadying - continues to demo decreased B LE foot clearance (catches toes more on carpet of ADL apartment) and decreased step lengths along with R inattention missing the apartment door despite cuing. Sit>stand apartment recliner>RW with mod assist for lifting into standing due to low height of chair - educated pt to avoid low seat heights at home. Therapist provided pt with RW bag and educated on proper use. Gait training in ADL apartment kitchen focusing on safe AD management, R  attention, R UE NMR, balance with frequent turns and head rotations while collecting fruit. Upon entering kitchen pt does not see wall of cabinets on R side of kitchen immediately looking to the 2 cabinets on the left - once made pt aware of R side he states "oh, I didn't see those." Requires min/mod cuing for improved AD placement at counters and keeping it close with him staying inside AD. Required pt use R UE to open/close cabinets/drawers/appliances and grasp fruit. Once on the far end of the kitchen, away from fruit basket, and pt needed something to collect the fruit in he required max question cuing to recall that therapist had provided him the walker bag (despite cuing prior to task to place fruit there). Educated pt on importance of visual scanning completely to R to locate items in environment. Gait training ~12ft back to room using RW with dual-task of pt having to locate room - initially turned L but after ~2-63ft realized he was going the wrong direction and auto-corrected. At end of session pt left seated in recliner with B LEs elevated (reinforcing education), needs in reach, and chair alarm on.  Therapy Documentation Precautions:  Precautions Precautions: Fall Restrictions Weight Bearing Restrictions: No  Pain:   Denies pain during session.  Therapy/Group: Individual Therapy  Tawana Scale , PT, DPT, CSRS  02/07/2020, 3:32 PM

## 2020-02-07 NOTE — Progress Notes (Signed)
Occupational Therapy Session Note  Patient Details  Name: Todd [name] MRN: [id number] Date of Birth: [birth year]  Today's Date: 02/07/2020 OT Individual Time: 6553-7482 OT Individual Time Calculation (min): 57 min    Short Term Goals: Week 2:  OT Short Term Goal 1 (Week 2): STGs = LTGs d/t ELOS at Supervision  Skilled Therapeutic Interventions/Progress Updates:    Pt greeted at time of session semireclined in bed resting but agreeable to OT session, stating he needed to change brief. Bed mobility supine to sit Supervision. Sit to stand CGA/close supervision with Rw and ambulated to bathroom in same manner. Doffed wet brief and pants with CGA with extended time as pt has delayed processing and slow movements. Continent void of bladder, but noted to have small amount of blood on tip of penis, RN notified. Pt stated no pain and unaware. Ambulated to sink level and performed UB bathe Supervision, LB bathe CGA as pt declined washing feet today/past knee level. Donned new brief with Min A to thread only, able to stand and don over hips. Donned pants with CGA/close supervision with extended time and cues to fully flex forward to reach, attempted figure four limited by flexibility this date. Donned new socks Mod A with assist to initiate and pt able to don past heel level. Ambulated room <> shower room CGA with RW and performed TTB transfer in same manner, pt agreeable to have at home for safety stating maybe his wife would use it too. Once back in room alarm on, call bell in reach.      Therapy Documentation Precautions:  Precautions Precautions: Fall Restrictions Weight Bearing Restrictions: No     Therapy/Group: Individual Therapy  Viona Gilmore 02/07/2020, 9:30 AM

## 2020-02-07 NOTE — Progress Notes (Signed)
Patient ID: Todd [name], male   DOB: [birth year], 79 y.o.   MRN: [id number]   Patient referral sent and accepted by [facility] for PT, OT and [name] follow up.  [place], [place]

## 2020-02-07 NOTE — Progress Notes (Signed)
Physical Therapy Session Note  Patient Details  Name: Todd [name] MRN: [id number] Date of Birth: 10-05-1940  Today's Date: 02/07/2020 PT Individual Time: 1000-1100 PT Individual Time Calculation (min): 60 min   Short Term Goals: Week 2:  PT Short Term Goal 2 (Week 2): STG = LTG due to ELOS  Skilled Therapeutic Interventions/Progress Updates:    Patient in recliner without complaints.  Sit to stand S increased time.  Noted wearing bariatric slipper socks so difficult to tell if edema in legs is better.  Reports MD did not mention it today.  Patient ambulated with S to CGA with RW 160'.  Patient in parallel bars for forward gait over cones for clearance and stride length then side stepping with mod cues for clearing cones and stepping wider. Seated arm movements for chest expansion and thoracic extension with max cues and assist.  Attempted trunk mobility seated on blue physioball, but pt seated too low so used sit disc on mat for ant/post pelvic tilt with D2 PNF pattern bilat UE cues for chest out, arching back and for slouching and rounding spine.  Standing stepping with D2 arm movements cues for step length, chest out, and assist for R UE shoulder abduction.  Ambulated with close S to CGA to dayroom and performed 5 min on Nu Step @ level 4 UE/LE to promote trunk rotation.  Patient ambulated to room x 220' CGA x 1 when almost running into wall on R, otherwise S.  Left seated in recliner with chair alarm active and needs in reach.  Therapy Documentation Precautions:  Precautions Precautions: Fall Restrictions Weight Bearing Restrictions: No Pain: Pain Assessment Pain Scale: 0-10 Pain Score: 0-No pain    Therapy/Group: Individual Therapy  Reginia Naas  Magda Kiel, PT 02/07/2020, 10:24 AM

## 2020-02-07 NOTE — Progress Notes (Signed)
Patient ID: Todd [name], male   DOB: 01/08/1941, 79 y.o.   MRN: [id number]   SW informed by spouse that she does not feel comfortable paying for medical equipment  over the phone. Will pick up DME in store.   [place], [place]

## 2020-02-08 ENCOUNTER — Inpatient Hospital Stay (HOSPITAL_COMMUNITY): Payer: No Typology Code available for payment source | Admitting: Occupational Therapy

## 2020-02-08 ENCOUNTER — Inpatient Hospital Stay (HOSPITAL_COMMUNITY): Payer: No Typology Code available for payment source

## 2020-02-08 NOTE — Progress Notes (Signed)
Physical Therapy Discharge Summary  Patient Details  Name: Todd [name] MRN: [id number] Date of Birth: [birth year]  Today's Date: 02/11/2020     Patient has met 7 of 8 long term goals due to improved activity tolerance, improved balance, increased strength, ability to compensate for deficits, and improved attention.  Patient to discharge at an ambulatory level Supervision.   Patient's care partner is independent to provide the necessary  supervision level  assistance at discharge.  Patient's wife and sister underwent education prior to discharge for supervision level of assistance.   Reasons goals not met:  Needing min A for transfer from sedan height vehicle due to low surface, but with cues for scooting forward could perform with S.  Recommendation:  Patient will benefit from ongoing skilled PT services in home health setting to continue to advance safe functional mobility, address ongoing impairments in R side awareness, balance and safety, and minimize fall risk.  Equipment: No equipment provided  Reasons for discharge: treatment goals met and discharge from hospital  Patient/family agrees with progress made and goals achieved: Yes  PT Discharge Precautions/Restrictions Precautions Precautions: Fall Vital Signs   Pain Pain Assessment Pain Scale: 0-10 Pain Score: 0-No pain Vision/Perception  Perception Perception: Within Functional Limits Praxis Praxis: Impaired Praxis Impairment Details: Initiation  Cognition Overall Cognitive Status: Within Functional Limits for tasks assessed Orientation Level: Oriented X4 Attention: Sustained Problem Solving: Impaired Safety/Judgment: Impaired Comments: Patient with slow processing and needs cues for safety reminders to use RW and for R sided obstacles at times Sensation Sensation Light Touch: Appears Intact Coordination Gross Motor Movements are Fluid and Coordinated: No Fine Motor Movements are Fluid and Coordinated:  No Coordination and Movement Description: slowed movements in UE/LE's Motor  Motor Motor: Abnormal postural alignment and control Motor - Discharge Observations: incresaed thoracic kyphosis, rounded shoulders, decresaed trunk mobility, forward head  Mobility Bed Mobility Bed Mobility: Supine to Sit;Sit to Supine Supine to Sit: Independent with assistive device Sit to Supine: Independent with assistive device Transfers Sit to Stand: Supervision/Verbal cueing Stand to Sit: Supervision/Verbal cueing Stand Pivot Transfers: Supervision/Verbal cueing Stand Pivot Transfer Details: Verbal cues for safe use of DME/AE;Verbal cues for precautions/safety;Verbal cues for technique Transfer (Assistive device): Rolling walker Locomotion  Gait Ambulation: Yes Gait Assistance: Supervision/Verbal cueing Gait Distance (Feet): 220 Feet Assistive device: Rolling walker Gait Assistance Details: Verbal cues for safe use of DME/AE Gait Assistance Details: cues for R side obstacles Gait Gait: Yes Gait Pattern: Step-through pattern;Decreased stride length;Poor foot clearance - right High Level Ambulation High Level Ambulation: Side stepping;Direction changes Side Stepping: S at counter Direction Changes: S and cues at times for R side awareness Stairs / Additional Locomotion Stairs: Yes Stairs Assistance: Supervision/Verbal cueing Stair Management Technique: Two rails;Alternating pattern;Forwards Number of Stairs: 12 Height of Stairs: 6 Ramp: Supervision/Verbal cueing Curb: Supervision/Verbal cueing Wheelchair Mobility Wheelchair Mobility: No  Trunk/Postural Assessment  Cervical Assessment Cervical Assessment: Exceptions to [facility] Thoracic Assessment Thoracic Assessment: Exceptions to [facility] Lumbar Assessment Lumbar Assessment: Exceptions to [facility] Postural Control Postural Control: Within Functional Limits Protective Responses: delayed during ADls and functional tasks Postural Limitations:  kyphosis, forward head, stiffness throughout  Balance Balance Balance Assessed: Yes Standardized Balance Assessment Standardized Balance Assessment: Berg Balance Test Berg Balance Test Sit to Stand: Able to stand  independently using hands Standing Unsupported: Able to stand safely 2 minutes Sitting with Back Unsupported but Feet Supported on Floor or Stool: Able to sit safely and securely 2 minutes Stand to Sit: Sits safely with minimal  use of hands Transfers: Able to transfer safely, definite need of hands Standing Unsupported with Eyes Closed: Able to stand 10 seconds safely Standing Ubsupported with Feet Together: Able to place feet together independently and stand 1 minute safely From Standing, Reach Forward with Outstretched Arm: Can reach forward >5 cm safely (2") From Standing Position, Pick up Object from Floor: Able to pick up shoe, needs supervision From Standing Position, Turn to Look Behind Over each Shoulder: Turn sideways only but maintains balance Turn 360 Degrees: Able to turn 360 degrees safely but slowly Standing Unsupported, Alternately Place Feet on Step/Stool: Able to complete >2 steps/needs minimal assist Standing Unsupported, One Foot in Front: Able to take small step independently and hold 30 seconds Standing on One Leg: Tries to lift leg/unable to hold 3 seconds but remains standing independently Total Score: 39 Static Sitting Balance Static Sitting - Level of Assistance: 6: Modified independent (Device/Increase time) Dynamic Sitting Balance Dynamic Sitting - Balance Support: No upper extremity supported Dynamic Sitting - Level of Assistance: 6: Modified independent (Device/Increase time) Static Standing Balance Static Standing - Balance Support: During functional activity;Left upper extremity supported Static Standing - Level of Assistance: 5: Stand by assistance Dynamic Standing Balance Dynamic Standing - Balance Support: During functional activity;Left  upper extremity supported Dynamic Standing - Level of Assistance: 5: Stand by assistance Extremity Assessment      RLE Assessment RLE Assessment: Exceptions to [facility] Active Range of Motion (AROM) Comments: stiff, but WFL RLE Strength RLE Overall Strength Comments: hip flexion 4-/5, knee extension 4+/5, ankle DF 4/5 LLE Assessment LLE Assessment: Exceptions to [facility] Active Range of Motion (AROM) Comments: WFL grossly but stiffness present LLE Strength LLE Overall Strength Comments: hip flexion 4/5, knee extension 4+/5, ankle DF4+/5    Reginia Naas 02/08/2020, 12:59 PM Magda Kiel, PT 02/11/2020

## 2020-02-08 NOTE — Progress Notes (Signed)
[place] PHYSICAL MEDICINE & REHABILITATION PROGRESS NOTE   Subjective/Complaints:   Pt reports didn't sleep well- up 2-3x overnight.  Otherwise no issues.  Agree with wife- doesn't want hyperglycemia to be treated, due to many episodes in past of hypoglycemia.   ROS:   Pt denies SOB, abd pain, CP, N/V/C/D, and vision changes   Objective:   No results found. No results for input(s): WBC, HGB, HCT, PLT in the last 72 hours. Recent Labs    02/06/20 0808  NA 136  K 4.5  CL 108  CO2 18*  GLUCOSE 217*  BUN 64*  CREATININE 3.25*  CALCIUM 8.2*    Intake/Output Summary (Last 24 hours) at 02/08/2020 0846 Last data filed at 02/08/2020 0700 Gross per 24 hour  Intake 177 ml  Output 100 ml  Net 77 ml        Physical Exam: Vital Signs Blood pressure 132/65, pulse 66, temperature 97.6 F (36.4 C), temperature source Oral, resp. rate 18, height 6\' 1"  (1.854 m), weight 101.3 kg, SpO2 100 %.  Constitutional: No distress . Vital signs reviewed. Dozing sitting up in bed- with lights off, NAD- woke easily HEENT: EOMI, oral membranes still dry Neck: supple Cardiovascular: RRR Respiratory/Chest: CTA B/L- no W/R/R- good air movement GI: Soft, NT, ND, (+)BS  Ext: B/L LE's1-2+ pitting edema to mid calf B/L Psych: pleasant, appropriate, but slowed processing Neurological: more alert, appropriate  Mild dysarthria noted. Intentional tremors noted BUE for a few seconds this AM Moves all 4's but MMT difficult d/t inconsistent participation. Senses pain in all 4's.     Assessment/Plan: 1. 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  Physiatrist is providing close team supervision and 24 hour management of active medical problems listed below.  Physiatrist and rehab team continue to assess barriers to discharge/monitor patient progress toward  functional and medical goals  Care Tool:  Bathing    Body parts bathed by patient: Right arm, Left arm, Chest, Abdomen, Front perineal area, Right upper leg, Left upper leg, Face, Buttocks, Right lower leg, Left lower leg   Body parts bathed by helper: Right lower leg, Left lower leg     Bathing assist Assist Level: Minimal Assistance - Patient > 75%     Upper Body Dressing/Undressing Upper body dressing   What is the patient wearing?: Pull over shirt    Upper body assist Assist Level: Contact Guard/Touching assist    Lower Body Dressing/Undressing Lower body dressing      What is the patient wearing?: Pants, Underwear/pull up     Lower body assist Assist for lower body dressing: Minimal Assistance - Patient > 75%     Toileting Toileting    Toileting assist Assist for toileting: Contact Guard/Touching assist     Transfers Chair/bed transfer  Transfers assist     Chair/bed transfer assist level: Contact Guard/Touching assist Chair/bed transfer assistive device: Programmer, multimedia   Ambulation assist      Assist level: Contact Guard/Touching assist Assistive device: Walker-rolling Max distance: 135ft   Walk 10 feet activity   Assist     Assist level: Contact Guard/Touching assist Assistive device: Walker-rolling   Walk 50 feet activity   Assist    Assist level: Contact Guard/Touching assist Assistive device: Walker-rolling    Walk 150 feet activity   Assist Walk 150 feet activity did not occur: Safety/medical concerns  Assist level: Contact Guard/Touching assist Assistive device: Walker-rolling    Walk 10 feet on uneven surface  activity   Assist     Assist level: Contact Guard/Touching assist Assistive device: Aeronautical engineer Will patient use wheelchair at discharge?: No Type of Wheelchair: Manual    Wheelchair assist level: Minimal Assistance - Patient > 75% Max wheelchair  distance: 10'    Wheelchair 50 feet with 2 turns activity    Assist        Assist Level: Total Assistance - Patient < 25%   Wheelchair 150 feet activity     Assist      Assist Level: Total Assistance - Patient < 25%   Blood pressure 132/65, pulse 66, temperature 97.6 F (36.4 C), temperature source Oral, resp. rate 18, height 6\' 1"  (1.854 m), weight 101.3 kg, SpO2 100 %.    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 -patient may shower -ELOS/Goals: 9-12 days, supervision to mod I with PT/OT    2. Antithrombotics: -DVT/anticoagulation:Pharmaceutical:Other (comment)--Eliquis 11/1- has hx of DVT- cannot stop- Hb dropped to 6.9 11/4- Hb up to 8.4- will monitor -antiplatelet therapy: N/A 3. Pain Management:tylenol prn for chronic LBP  10/30- pain controlled- con't tylenol prn 4. Mood:LCSW to follow for evaluation and support. -antipsychotic agents: N/A 5. Neuropsych: This patientis intermittentlycapable of making decisions onhisown behalf. 6. Skin/Wound Care:Routine pressure relief measures. 7. Fluids/Electrolytes/Nutrition:Monitor I/O. Check lytes in am.  8. Aspiration PNA?:   Augmentin for 10 days  10/28- completed Augmentin 9. HTN: Monitor BP tid--continue Norvasc, coreg bid, Cardura bid, Proscar and Hydralazine tid.   10/28-BP 156/71- somewhat elevated, but was OK yesterday- will monitor x 24 hours and see if needs ot change meds  11/4- BP 155/70- slightly elevated, but isn't usually- will con't  11/5- BP great control today 10. GERD/gastric polyps: On Protonix. 11. PAF: Monitor HR tid --on coreg and Eliquis. 12.Acute on chronic anemia: Will continue to monitor H/H. Recheck CBC in am. Continue iron supplement.  10/27- Hb dropped slightly to 7.6- will recheck and monitor closely  11/1- Hb 6.9- transfuse 1 unit pRBCs- and check  occult blood. Marland Kitchen   11/2- Hb up to 8.4- also helped his Cr  11/4- will recheck on monday 13. Constipation  10/28- has been 3+ days- will give Sorbitol- if doesn't work, will give Mg citrate to clean him out.   10/29- Cleaned out  11/1- LBM 2-3 daysa go- will give Sorbitol again today  11/2- very large BM yesterday- pt unaware of going, of note.   11/4- LBM yesterday 14. 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   10/29- Cr up to 2.78- last was 2.64- could be previous ABX- will recheck Monday- per nursing, drinking OK.   11/1- Cr 3.47- however Hb 6.9- will transfuse and recheck in AM- if still elevated, give IVFs tomorrow- don't think drinking well  11/2- Cr down to 3.28- from 3.47- will give IVFs NS 75cc/hour from 4pm this afternoon to 8am tomorrow AM to avoid giving with therapy- and recheck in AM  11/3- S/p IVFs- Cr didn't move basically at all- BUN also stable after IVFs. - remembers to drink 6 cups/day; pushing PO fluids- appears that is at new baseline Cr- low 3's. 15. LE edema  11/4- will order TEDs for LE edema- and monitor   11/5- stable- con't regimen  Spoke to wife at length- pt AND wife do NOT want  any DM meds at all- he's had too many issues with hypoglycemia in past to want to take anything- also don't want to change from heart healthy diet to diabetic diet- no changes will be made/ no addition of DM meds with be made, regardless, per wife.   LOS: 10 days A FACE TO FACE EVALUATION WAS PERFORMED  Bion Todorov 02/08/2020, 8:46 AM

## 2020-02-08 NOTE — Plan of Care (Signed)
PT goals downgraded as noted below (except car transfer goal upgraded due to progress).  Patient with continued decreased R side attention and will need supervision level assistance at d/c.  Problem: RH Balance Goal: LTG Patient will maintain dynamic standing balance (PT) Description: LTG:  Patient will maintain dynamic standing balance with assistance during mobility activities (PT) Flowsheets (Taken 02/08/2020 1017) LTG: Pt will maintain dynamic standing balance during mobility activities with:: (downgraded due to decreased R side attention) Supervision/Verbal cueing   Problem: Sit to Stand Goal: LTG:  Patient will perform sit to stand with assistance level (PT) Description: LTG:  Patient will perform sit to stand with assistance level (PT) Flowsheets (Taken 02/08/2020 1017) LTG: PT will perform sit to stand in preparation for functional mobility with assistance level: (downgraded due to decresed R side attention) Supervision/Verbal cueing   Problem: RH Car Transfers Goal: LTG Patient will perform car transfers with assist (PT) Description: LTG: Patient will perform car transfers with assistance (PT). Flowsheets (Taken 02/08/2020 1017) LTG: Pt will perform car transfers with assist:: (upgraded due to progress) Supervision/Verbal cueing   Problem: RH Ambulation Goal: LTG Patient will ambulate in controlled environment (PT) Description: LTG: Patient will ambulate in a controlled environment, # of feet with assistance (PT). Flowsheets (Taken 02/08/2020 1017) LTG: Pt will ambulate in controlled environ  assist needed:: (downgraded due to decrease R side attention) Supervision/Verbal cueing LTG: Ambulation distance in controlled environment: 200   Problem: RH Ambulation Goal: LTG Patient will ambulate in home environment (PT) Description: LTG: Patient will ambulate in home environment, # of feet with assistance (PT). Flowsheets (Taken 02/08/2020 1017) LTG: Pt will ambulate in home environ   assist needed:: (downgraded due to decreased R side attention) Supervision/Verbal cueing LTG: Ambulation distance in home environment: 37'  Magda Kiel, PT

## 2020-02-08 NOTE — Progress Notes (Signed)
Occupational Therapy Session Note  Patient Details  Name: Todd [name] MRN: [id number] Date of Birth: 06-08-1940  Today's Date: 02/08/2020 OT Individual Time: 4132-4401 OT Individual Time Calculation (min): 45 min    Short Term Goals: Week 2:  OT Short Term Goal 1 (Week 2): STGs = LTGs d/t ELOS at Supervision  Skilled Therapeutic Interventions/Progress Updates:    Treatment session with focus on self-care retraining with functional transfers, dynamic standing balance, and sequencing LB dressing tasks. Pt received upright in recliner reporting need to toilet.  Pt ambulated to toilet with RW with CGA to close supervision.  Pt voided bladder in standing with supervision.  Pt ambulated to sink with RW to engage in bathing.  Therapist set up items on Rt side to facilitate visual scanning to Rt environment to obtain items.  Pt bathed with LUE primarily but able to utilize RUE during bathing and dressing as well.  Pt able to thread BLE through pull up and pants with increased time.  Educated on use of shoe horn to assist with donning shoes, however due to swelling in BLE pt still required assistance to don shoes even with use of shoe horn.  Pt ambulated back to recliner and left upright with chair alarm on and all needs in reach.  Therapy Documentation Precautions:  Precautions Precautions: Fall Restrictions Weight Bearing Restrictions: No General:   Vital Signs: Therapy Vitals Temp: 97.7 F (36.5 C) Pulse Rate: (!) 59 Resp: 18 BP: 140/67 Patient Position (if appropriate): Sitting Oxygen Therapy SpO2: 100 % O2 Device: Room Air Pain: Pain Assessment Pain Scale: 0-10 Pain Score: 0-No pain   Therapy/Group: Individual Therapy  Simonne Come 02/08/2020, 4:01 PM

## 2020-02-08 NOTE — Progress Notes (Signed)
Physical Therapy Session Note  Patient Details  Name: Todd [name] MRN: [id number] Date of Birth: 13-Feb-1941  Today's Date: 02/08/2020 PT Individual Time:Session1: 9702-6378; [place] 2: 1130-1200; Session3: 5885-0277 PT Individual Time Calculation (min): 45 min, 30 min,  75 min  Short Term Goals: Week 2:  PT Short Term Goal 2 (Week 2): STG = LTG due to ELOS  Skilled Therapeutic Interventions/Progress Updates:    Session1:  Patient in supine in bed, denies pain or symptoms.  Assist to don XL knee hi TEDS in supine.  Patient supine to sit with S.  Seated to don shirt and pants and shoes with S and increased time.  Patient sit to stand with S to RW.  Ambulated to therapy gym with S.  In gym gait training with focus on R side awareness maneuvering around and over obstacles, locating items on R side with close S and min cues x 80', then 200'.  Patient negotiated 12 steps with B rails and close S cues for foot placement on step.  Patient standing with intermittent L UE support to reach for and toss horse shoes.  Patient ambulated back to room x 130' with S with RW.  Patient seated in recliner with legs elevated, all needs in reach and chair alarm activated.   Session2: Patient in recliner in room.  Requesting to use the bathroom.  S sit to stand to RW, ambulated into restroom with S, pushing walker over toilet and standing with S for continent void into toilet.  Cues to remember walker when leaving bathroom and standing to wash hands at sink with S.  Ambulated to ortho gym with RW and S.  Practiced car transfer with cues for technique and CGA for sit to stand with increased time.  Patient negotiated ramp with S with RW.  Seated for LE MMT and sitting dynamic balance assessment.  Patient standing for [employer] for 2 minute reaction time task with intermittent 1 UE support and S.  Performed x 1 with L UE with score of 55; RT 2.18 sec; then with R hand with score of 36; RT 3.33 sec.  Patient turned to ambulate  to room without RW needing cues to take walker and walked with S x 100'.  Left seated in recliner with call bell in reach and chair alarm activated.   Session3: Patient in recliner in room.  Reports took a bath with OT.  Patient sit to stand with S and ambulated with RW and A to ortho gym.  Completed Berg Balance assessment with score 39/56.  Education with pt regarding fall prevention with handout given and advised to continue using walker for safety.  Patient performed activities for HEP including sit <> stand x 5, standing hip abduction x 10 at counter, heel and toe raises at counter and side stepping with braiding with UE support on counter.  Attempted stepping with arms up and out but would need support for safety so did not add to HEP.  Issued handout and reviewed with pt.  Patient ambulated 27' with S with RW running into objects on R side x 2 with RW, but no LOB.  Patient on Nu Step x 5 min UE/LE x 5 min at level 4.  Patient ambulated to room with S with RW.  Upon arriving pt's wife in the room and her sister.  Discussed plan for caregiver education tomorrow that pt will need supervision for safety with ambulation due to decreased R side attention and on steps for ensuring foot  all the way on step.  However wife reports sister will not stay with them, it will be her only there to supervise.  Also discussed need for walker use and she reports has walkers at home already.  Patient left in recliner in room with wife present and chair alarm set with call bell in reach.      Therapy Documentation Precautions:  Precautions Precautions: Fall Restrictions Weight Bearing Restrictions: No Pain: Pain Assessment Pain Scale: 0-10 Pain Score: 0-No pain    Therapy/Group: Individual Therapy  Reginia Naas  [place], PT 02/08/2020, 10:08 AM

## 2020-02-09 ENCOUNTER — Ambulatory Visit (HOSPITAL_COMMUNITY): Payer: No Typology Code available for payment source

## 2020-02-09 ENCOUNTER — Encounter (HOSPITAL_COMMUNITY): Payer: No Typology Code available for payment source | Admitting: Occupational Therapy

## 2020-02-09 LAB — OCCULT BLOOD X 1 CARD TO LAB, STOOL: Fecal Occult Bld: NEGATIVE

## 2020-02-09 NOTE — Progress Notes (Signed)
[place] PHYSICAL MEDICINE & REHABILITATION PROGRESS NOTE   Subjective/Complaints: No complaints this morning. Denies pain, constipation, insomnia.   ROS:  Pt denies SOB, abd pain, CP, N/V/C/D, and vision changes   Objective:   No results found. No results for input(s): WBC, HGB, HCT, PLT in the last 72 hours. No results for input(s): NA, K, CL, CO2, GLUCOSE, BUN, CREATININE, CALCIUM in the last 72 hours.  Intake/Output Summary (Last 24 hours) at 02/09/2020 1456 Last data filed at 02/09/2020 1416 Gross per 24 hour  Intake 380 ml  Output --  Net 380 ml   Physical Exam: Vital Signs Blood pressure (!) 150/65, pulse 70, temperature 99.3 F (37.4 C), temperature source Oral, resp. rate 14, height 6\' 1"  (1.854 m), weight 103.6 kg, SpO2 100 %. General: Alert and oriented x 3, No apparent distress HEENT: Head is normocephalic, atraumatic, PERRLA, EOMI, sclera anicteric, oral mucosa pink and moist, dentition intact, ext ear canals clear,  Neck: Supple without JVD or lymphadenopathy Heart: Reg rate and rhythm. No murmurs rubs or gallops Chest: CTA bilaterally without wheezes, rales, or rhonchi; no distress Abdomen: Soft, non-tender, non-distended, bowel sounds positive.  Ext: B/L LE's1-2+ pitting edema to mid calf B/L Psych: pleasant, appropriate, but slowed processing Neurological: more alert, appropriate  Mild dysarthria noted. Intentional tremors noted BUE for a few seconds this AM Moves all 4's but MMT difficult d/t inconsistent participation. Senses pain in all 4's.   Assessment/Plan: 1. 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  Physiatrist is providing close team supervision and 24 hour management of active medical problems listed below.  Physiatrist and rehab team continue to assess barriers to discharge/monitor patient progress toward  functional and medical goals  Care Tool:  Bathing    Body parts bathed by patient: Right arm, Left arm, Chest, Abdomen, Front perineal area, Right upper leg, Left upper leg, Face   Body parts bathed by helper: Buttocks     Bathing assist Assist Level: Minimal Assistance - Patient > 75%     Upper Body Dressing/Undressing Upper body dressing   What is the patient wearing?: Pull over shirt    Upper body assist Assist Level: Supervision/Verbal cueing    Lower Body Dressing/Undressing Lower body dressing      What is the patient wearing?: Pants, Underwear/pull up     Lower body assist Assist for lower body dressing: Supervision/Verbal cueing     Toileting Toileting    Toileting assist Assist for toileting: Supervision/Verbal cueing     Transfers Chair/bed transfer  Transfers assist     Chair/bed transfer assist level: Supervision/Verbal cueing Chair/bed transfer assistive device: Armrests   Locomotion Ambulation   Ambulation assist      Assist level: Supervision/Verbal cueing Assistive device: Walker-rolling Max distance: 220   Walk 10 feet activity   Assist     Assist level: Supervision/Verbal cueing Assistive device: Walker-rolling   Walk 50 feet activity   Assist    Assist level: Supervision/Verbal cueing Assistive device: Walker-rolling    Walk 150 feet activity   Assist Walk 150 feet activity did not occur: Safety/medical concerns  Assist level: Supervision/Verbal cueing Assistive device: Walker-rolling    Walk 10 feet on uneven surface  activity   Assist     Assist level: Supervision/Verbal cueing Assistive device: Walker-rolling   Wheelchair     Assist Will patient use wheelchair at discharge?: No Type  of Wheelchair: Manual    Wheelchair assist level: Minimal Assistance - Patient > 75% Max wheelchair distance: 10'    Wheelchair 50 feet with 2 turns activity    Assist        Assist Level: Total  Assistance - Patient < 25%   Wheelchair 150 feet activity     Assist      Assist Level: Total Assistance - Patient < 25%   Blood pressure (!) 150/65, pulse 70, temperature 99.3 F (37.4 C), temperature source Oral, resp. rate 14, height 6\' 1"  (1.854 m), weight 103.6 kg, SpO2 100 %.    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 -patient may shower -ELOS/Goals: 9-12 days, supervision to mod I with PT/OT   -Continue CIR 2. Antithrombotics: -DVT/anticoagulation:Pharmaceutical:Other (comment)--Eliquis 11/1- has hx of DVT- cannot stop- Hb dropped to 6.9 11/4- Hb up to 8.4- will monitor -antiplatelet therapy: N/A 3. Pain Management:tylenol prn for chronic LBP  11/6: pain controlled con't tylenol prn 4. Mood:LCSW to follow for evaluation and support. -antipsychotic agents: N/A 5. Neuropsych: This patientis intermittentlycapable of making decisions onhisown behalf. 6. Skin/Wound Care:Routine pressure relief measures. 7. Fluids/Electrolytes/Nutrition:Monitor I/O. Check lytes in am.  8. Aspiration PNA?:   Augmentin for 10 days  10/28- completed Augmentin 9. HTN: Monitor BP tid--continue Norvasc, coreg bid, Cardura bid, Proscar and Hydralazine tid.   10/28-BP 156/71- somewhat elevated, but was OK yesterday- will monitor x 24 hours and see if needs ot change meds  11/4- BP 155/70- slightly elevated, but isn't usually- will con't  11/6: BP elevated, continue to monitor.  10. GERD/gastric polyps: On Protonix. 11. PAF: Monitor HR tid --on coreg and Eliquis. 12.Acute on chronic anemia: Will continue to monitor H/H. Recheck CBC in am. Continue iron supplement.  10/27- Hb dropped slightly to 7.6- will recheck and monitor closely  11/1- Hb 6.9- transfuse 1 unit pRBCs- and check occult blood. .   11/2: hgb 8.2 13. Constipation  10/28- has been 3+ days- will  give Sorbitol- if doesn't work, will give Mg citrate to clean him out.   10/29- Cleaned out  11/1- LBM 2-3 daysa go- will give Sorbitol again today  11/2- very large BM yesterday- pt unaware of going, of note.   11/4- LBM yesterday 14. 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   10/29- Cr up to 2.78- last was 2.64- could be previous ABX- will recheck Monday- per nursing, drinking OK.   11/1- Cr 3.47- however Hb 6.9- will transfuse and recheck in AM- if still elevated, give IVFs tomorrow- don't think drinking well  11/2- Cr down to 3.28- from 3.47- will give IVFs NS 75cc/hour from 4pm this afternoon to 8am tomorrow AM to avoid giving with therapy- and recheck in AM  11/3- S/p IVFs- Cr didn't move basically at all- BUN also stable after IVFs. - remembers to drink 6 cups/day; pushing PO fluids- appears that is at new baseline Cr- low 3's. 15. LE edema  11/4- will order TEDs for LE edema- and monitor   11/5- stable- con't regimen  Spoke to wife at length- pt AND wife do NOT want any DM meds at all- he's had too many issues with hypoglycemia in past to want to take anything- also don't want to change from heart healthy diet to diabetic diet- no changes will be made/ no addition of DM meds with be made, regardless, per wife.   LOS: 11 days A FACE TO FACE EVALUATION WAS  PERFORMED  Izora Ribas 02/09/2020, 2:56 PM

## 2020-02-09 NOTE — Progress Notes (Addendum)
Physical Therapy Session Note  Patient Details  Name: Todd [name] MRN: [id number] Date of Birth: 08-07-1940  Today's Date: 02/09/2020 PT Individual Time: 8022-3361 PT Individual Time Calculation (min): 55 min   Short Term Goals: Week 2:  PT Short Term Goal 2 (Week 2): STG = LTG due to ELOS  Skilled Therapeutic Interventions/Progress Updates:    Session focused on family education with pt's wife and sister (?) with focus on bed mobility, dressing EOB, functional transfers including basic, car and furniture transfers to low recliner, gait with RW on unit and in home environment, stair negotiation and overall safety with mobility and fall risk. Educated on decreased attention to the R at times especially in distracting environment or crowded obstacles. Family declined hands on family return demonstration due to wife's own health status (reports issues with ankle and hip and uses a cane) but observed all aspects of mobility. They report they will have other people to assist with driving. Recommended at this time, to wait to practice flight of stairs with home health therapist for safety. Report ramped access to the home. Wife also reports he already has a RW at the home. Answered all questions and all deny any concerns with upcoming d/c. Pt performed all mobility at overall supervision level for safety using RW. Did require min assist from low recliner but educated on use of pillows/towels to build up seat at home if still too low.    Addendum:  Also educated and demonstrated at supervision level gait on ramp for community mobility with RW as well as mulched surface negotiation and curb step with RW.   Therapy Documentation Precautions:  Precautions Precautions: Fall Restrictions Weight Bearing Restrictions: No   Pain: Denies pain.   Therapy/Group: Individual Therapy  Canary Brim Ivory Broad, PT, DPT, CBIS  02/09/2020, 3:05 PM

## 2020-02-09 NOTE — Progress Notes (Signed)
Occupational Therapy Session Note  Patient Details  Name: Todd [name] MRN: [id number] Date of Birth: April 30, 1940  Today's Date: 02/09/2020 OT Individual Time: 1403-1500 OT Individual Time Calculation (min): 57 min    Short Term Goals: Week 1:  OT Short Term Goal 1 (Week 1): Pt will don LB clothing with LRAD and min A OT Short Term Goal 1 - Progress (Week 1): Met OT Short Term Goal 2 (Week 1): Pt will complete sit > stand from [facility] or toilet with min A OT Short Term Goal 2 - Progress (Week 1): Met OT Short Term Goal 3 (Week 1): Pt will complete toileting tasks with CGA OT Short Term Goal 3 - Progress (Week 1): Met  Skilled Therapeutic Interventions/Progress Updates:    Pt greeted in the recliner, no c/o pain. Sister Dot and wife Magda Paganini present for family education. Pt declined showering today but wanted to use the restroom to start session. Magda Paganini and OT provided supervision assist while pt transferred using the RW. Vcs for asking for help to remove elevated toilet component from toilet vs doing it himself after abandoning the walker. Educated Magda Paganini to always ensure that pt is using the RW to ambulate, also educated her to cue pt for Rt visual field scanning to make sure he avoids obstacles. After pt voided bladder and washed his hands at the sink, he ambulated to the tub shower room. Per Magda Paganini, they have already ordered a TTB for home. After she walked with pt (providing supervision-CGA) down the hallway, practiced shower transfers as well using RW. Advised for family to purchase nonslip shower treads and for them to provide full supervision during shower, ADLs, and all mobility with RW. Trained both wife and sister how to close up the RW. Education provided regarding importance of having pt participate in IADLs at home for continued remediation of strength and endurance. Issued pt with an exercise printout for Rt hand strengthening and reviewed exercises together. Pt was unable to read exercise  handout but wife was able to do so. Pt remained in the recliner at close of session, left with all needs within reach and chair alarm set.  Therapy Documentation Precautions:  Precautions Precautions: Fall Restrictions Weight Bearing Restrictions: No Vital Signs: Therapy Vitals Temp: 99.3 F (37.4 C) Temp Source: Oral Pulse Rate: 70 Resp: 14 BP: (!) 150/65 Patient Position (if appropriate): Sitting Oxygen Therapy SpO2: 100 % O2 Device: Room Air ADL: ADL Eating: Set up Where Assessed-Eating: Edge of bed Grooming: Supervision/safety Where Assessed-Grooming: Standing at sink Upper Body Bathing: Setup Where Assessed-Upper Body Bathing: Shower Lower Body Bathing: Supervision/safety Where Assessed-Lower Body Bathing: Shower Upper Body Dressing: Supervision/safety Where Assessed-Upper Body Dressing: Edge of bed Lower Body Dressing: Minimal assistance Where Assessed-Lower Body Dressing: Edge of bed Toileting: Supervision/safety Where Assessed-Toileting: Glass blower/designer: Close supervision Toilet Transfer Method: Counselling psychologist: Engineer, technical sales Transfer: Close supervison Tub/Shower Equipment: Radio broadcast assistant     Therapy/Group: Individual Therapy  Marimar Suber A Ellar Hakala 02/09/2020, 4:49 PM

## 2020-02-10 NOTE — Progress Notes (Signed)
[place] PHYSICAL MEDICINE & REHABILITATION PROGRESS NOTE   Subjective/Complaints: No complaints IV may be removed SBP slightly elevated, other vitals stable  ROS:  Pt denies SOB, abd pain, CP, N/V/C/D, and vision changes   Objective:   No results found. No results for input(s): WBC, HGB, HCT, PLT in the last 72 hours. No results for input(s): NA, K, CL, CO2, GLUCOSE, BUN, CREATININE, CALCIUM in the last 72 hours.  Intake/Output Summary (Last 24 hours) at 02/10/2020 1536 Last data filed at 02/10/2020 1306 Gross per 24 hour  Intake 720 ml  Output --  Net 720 ml   Physical Exam: Vital Signs Blood pressure (!) 141/65, pulse 62, temperature 98.2 F (36.8 C), temperature source Oral, resp. rate 14, height 6\' 1"  (1.854 m), weight 102.4 kg, SpO2 100 %.  General: Alert, No apparent distress HEENT: Head is normocephalic, atraumatic, PERRLA, EOMI, sclera anicteric, oral mucosa pink and moist, dentition intact, ext ear canals clear,  Neck: Supple without JVD or lymphadenopathy Heart: Reg rate and rhythm. No murmurs rubs or gallops Chest: CTA bilaterally without wheezes, rales, or rhonchi; no distress Abdomen: Soft, non-tender, non-distended, bowel sounds positive.  Ext: B/L LE's1-2+ pitting edema to mid calf B/L Psych: pleasant, appropriate, but slowed processing Neurological: more alert, appropriate  Mild dysarthria noted. Intentional tremors noted BUE for a few seconds this AM Moves all 4's but MMT difficult d/t inconsistent participation. Senses pain in all 4's.   Assessment/Plan: 1. 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  Physiatrist is providing close team supervision and 24 hour management of active medical problems listed below.  Physiatrist and rehab team continue to assess barriers to discharge/monitor patient progress toward  functional and medical goals  Care Tool:  Bathing    Body parts bathed by patient: Right arm, Left arm, Chest, Abdomen, Front perineal area, Right upper leg, Left upper leg, Face   Body parts bathed by helper: Buttocks     Bathing assist Assist Level: Minimal Assistance - Patient > 75%     Upper Body Dressing/Undressing Upper body dressing   What is the patient wearing?: Pull over shirt    Upper body assist Assist Level: Supervision/Verbal cueing    Lower Body Dressing/Undressing Lower body dressing      What is the patient wearing?: Pants, Underwear/pull up     Lower body assist Assist for lower body dressing: Supervision/Verbal cueing     Toileting Toileting    Toileting assist Assist for toileting: Supervision/Verbal cueing     Transfers Chair/bed transfer  Transfers assist     Chair/bed transfer assist level: Supervision/Verbal cueing Chair/bed transfer assistive device: Programmer, multimedia   Ambulation assist      Assist level: Supervision/Verbal cueing Assistive device: Walker-rolling Max distance: 200'   Walk 10 feet activity   Assist     Assist level: Supervision/Verbal cueing Assistive device: Walker-rolling   Walk 50 feet activity   Assist    Assist level: Supervision/Verbal cueing Assistive device: Walker-rolling    Walk 150 feet activity   Assist Walk 150 feet activity did not occur: Safety/medical concerns  Assist level: Supervision/Verbal cueing Assistive device: Walker-rolling    Walk 10 feet on uneven surface  activity   Assist     Assist level: Supervision/Verbal cueing Assistive device: Aeronautical engineer Will patient use wheelchair at discharge?: No Type  of Wheelchair: Manual    Wheelchair assist level: Minimal Assistance - Patient > 75% Max wheelchair distance: 10'    Wheelchair 50 feet with 2 turns activity    Assist        Assist Level: Total  Assistance - Patient < 25%   Wheelchair 150 feet activity     Assist      Assist Level: Total Assistance - Patient < 25%   Blood pressure (!) 141/65, pulse 62, temperature 98.2 F (36.8 C), temperature source Oral, resp. rate 14, height 6\' 1"  (1.854 m), weight 102.4 kg, SpO2 100 %.    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 -patient may shower -ELOS/Goals: 9-12 days, supervision to mod I with PT/OT   -Continue CIR 2. Antithrombotics: -DVT/anticoagulation:Pharmaceutical:Other (comment)--Eliquis 11/1- has hx of DVT- cannot stop- Hb dropped to 6.9 11/4- Hb up to 8.4- will monitor -antiplatelet therapy: N/A 3. Pain Management:tylenol prn for chronic LBP  11/7: pain controlled, con't tylenol prn 4. Mood:LCSW to follow for evaluation and support. -antipsychotic agents: N/A 5. Neuropsych: This patientis intermittentlycapable of making decisions onhisown behalf. 6. Skin/Wound Care:Routine pressure relief measures. 7. Fluids/Electrolytes/Nutrition:Monitor I/O. Check lytes in am.  8. Aspiration PNA?:   Augmentin for 10 days  10/28- completed Augmentin 9. HTN: Monitor BP tid--continue Norvasc, coreg bid, Cardura bid, Proscar and Hydralazine tid.   10/28-BP 156/71- somewhat elevated, but was OK yesterday- will monitor x 24 hours and see if needs ot change meds  11/4- BP 155/70- slightly elevated, but isn't usually- will con't  11/6-7: BP has been slightly elevated, HR stable 10. GERD/gastric polyps: On Protonix. 11. PAF: Monitor HR tid --on coreg and Eliquis.. HR stable 12.Acute on chronic anemia: Will continue to monitor H/H. Recheck CBC in am. Continue iron supplement.  10/27- Hb dropped slightly to 7.6- will recheck and monitor closely  11/1- Hb 6.9- transfuse 1 unit pRBCs- and check occult blood. .   11/2: hgb 8.2 13. Constipation  10/28- has  been 3+ days- will give Sorbitol- if doesn't work, will give Mg citrate to clean him out.   10/29- Cleaned out  11/1- LBM 2-3 daysa go- will give Sorbitol again today  11/2- very large BM yesterday- pt unaware of going, of note.   11/4- LBM yesterday 14. 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   10/29- Cr up to 2.78- last was 2.64- could be previous ABX- will recheck Monday- per nursing, drinking OK.   11/1- Cr 3.47- however Hb 6.9- will transfuse and recheck in AM- if still elevated, give IVFs tomorrow- don't think drinking well  11/2- Cr down to 3.28- from 3.47- will give IVFs NS 75cc/hour from 4pm this afternoon to 8am tomorrow AM to avoid giving with therapy- and recheck in AM  11/3- S/p IVFs- Cr didn't move basically at all- BUN also stable after IVFs. - remembers to drink 6 cups/day; pushing PO fluids- appears that is at new baseline Cr- low 3's. 15. LE edema  11/4- will order TEDs for LE edema- and monitor   11/5- stable- con't regimen  Spoke to wife at length- pt AND wife do NOT want any DM meds at all- he's had too many issues with hypoglycemia in past to want to take anything- also don't want to change from heart healthy diet to diabetic diet- no changes will be made/ no addition of DM meds with be made, regardless, per wife.   LOS: 12 days A FACE TO  FACE EVALUATION WAS PERFORMED  Izora Ribas 02/10/2020, 3:36 PM

## 2020-02-10 NOTE — Plan of Care (Signed)
Problem: RH Dressing Goal: LTG Patient will perform upper body dressing (OT) Description: LTG Patient will perform upper body dressing with assist, with/without cues (OT). Outcome: Not Met (add Reason) Note: Goal not met due to pt still requiring cues to don shirt Goal: LTG Patient will perform lower body dressing w/assist (OT) Description: LTG: Patient will perform lower body dressing with assist, with/without cues in positioning using equipment (OT) Outcome: Not Met (add Reason) Note: Goal not met due to pt still requiring Min A for dressing   Problem: RH Dressing Goal: LTG Patient will perform lower body dressing w/assist (OT) Description: LTG: Patient will perform lower body dressing with assist, with/without cues in positioning using equipment (OT) Outcome: Not Met (add Reason) Note: Goal not met due to pt still requiring Min A for dressing

## 2020-02-11 LAB — BASIC METABOLIC PANEL
Anion gap: 7 (ref 5–15)
BUN: 63 mg/dL — ABNORMAL HIGH (ref 8–23)
CO2: 19 mmol/L — ABNORMAL LOW (ref 22–32)
Calcium: 8.4 mg/dL — ABNORMAL LOW (ref 8.9–10.3)
Chloride: 110 mmol/L (ref 98–111)
Creatinine, Ser: 3.49 mg/dL — ABNORMAL HIGH (ref 0.61–1.24)
GFR, Estimated: 17 mL/min — ABNORMAL LOW (ref >60)
Glucose, Bld: 195 mg/dL — ABNORMAL HIGH (ref 70–99)
Potassium: 5.1 mmol/L (ref 3.5–5.1)
Sodium: 136 mmol/L (ref 135–145)

## 2020-02-11 LAB — CBC
HCT: 23.6 % — ABNORMAL LOW (ref 39.0–52.0)
Hemoglobin: 7.3 g/dL — ABNORMAL LOW (ref 13.0–17.0)
MCH: 26.4 pg (ref 26.0–34.0)
MCHC: 30.9 g/dL (ref 30.0–36.0)
MCV: 85.2 fL (ref 80.0–100.0)
Platelets: 242 10*3/uL (ref 150–400)
RBC: 2.77 MIL/uL — ABNORMAL LOW (ref 4.22–5.81)
RDW: 14.5 % (ref 11.5–15.5)
WBC: 6 10*3/uL (ref 4.0–10.5)
nRBC: 0 % (ref 0.0–0.2)

## 2020-02-11 LAB — HEMOGLOBIN AND HEMATOCRIT, BLOOD
HCT: 25.5 % — ABNORMAL LOW (ref 39.0–52.0)
Hemoglobin: 7.8 g/dL — ABNORMAL LOW (ref 13.0–17.0)

## 2020-02-11 MED ORDER — SENNA-DOCUSATE SODIUM 8.6-50 MG PO TABS: 1.0000 | ORAL_TABLET | Freq: Every day | ORAL | Status: AC

## 2020-02-11 MED ORDER — ACETAMINOPHEN 325 MG PO TABS
325.0000 mg | ORAL_TABLET | ORAL | Status: AC | PRN
Start: 2020-02-11 — End: ?

## 2020-02-11 MED ORDER — APIXABAN 2.5 MG PO TABS: 2.5000 mg | ORAL_TABLET | Freq: Two times a day (BID) | ORAL | 0 refills | Status: AC

## 2020-02-11 MED ORDER — HYDRALAZINE HCL 25 MG PO TABS: 75.0000 mg | ORAL_TABLET | Freq: Three times a day (TID) | ORAL | 0 refills | Status: AC

## 2020-02-11 NOTE — Progress Notes (Signed)
Inpatient Rehabilitation Care Coordinator  Discharge Note  The overall goal for the admission was met for:   Discharge location: Yes, Home  Length of Stay: Yes, 13 Days  Discharge activity level: Yes  Home/community participation: Yes  Services provided included: MD, RD, PT, OT, SLP, RN, CM, TR, Pharmacy, [facility]: Private Insurance: [employer]  Follow-up services arranged: Home Health: [facility]  Comments (or additional information): PT OT [facility]  Patient/Family verbalized understanding of follow-up arrangements: Yes  Individual responsible for coordination of the follow-up plan: Spouse: Noemi Chapel [phone]  Confirmed correct DME delivered: Dyanne Iha 02/11/2020    Dyanne Iha

## 2020-02-11 NOTE — Discharge Summary (Signed)
Physician Discharge Summary  Patient ID: Todd [name] MRN: [id number] DOB/AGE: July 21, 1940 79 y.o.  Admit date: 01/29/2020 Discharge date: 02/11/2020  Discharge Diagnoses:  Principal Problem:   Stroke [facility]) Active Problems:   Hypertension   Chronic kidney disease (CKD), stage IV (severe) (HCC)   History of atrial fibrillation, S/P EP ablation   Iron deficiency anemia   GERD (gastroesophageal reflux disease)   Discharged Condition: stable   Significant Diagnostic Studies: N/a   Labs:  Basic Metabolic Panel: Recent Labs  Lab 02/05/20 0731 02/06/20 0808 02/11/20 0749  NA 137 136 136  K 4.5 4.5 5.1  CL 108 108 110  CO2 20* 18* 19*  GLUCOSE 204* 217* 195*  BUN 62* 64* 63*  CREATININE 3.28* 3.25* 3.49*  CALCIUM 8.6* 8.2* 8.4*    CBC: Recent Labs  Lab 02/05/20 0731 02/11/20 0749 02/11/20 1023  WBC 7.7 6.0  --   NEUTROABS 5.8  --   --   HGB 8.4* 7.3* 7.8*  HCT 26.7* 23.6* 25.5*  MCV 83.7 85.2  --   PLT 312 242  --     CBG: No results for input(s): GLUCAP in the last 168 hours.  Brief HPI:   Todd [name] is a 79 y.o. RH-male with history of PAF, T2DM-diet controlled, CKD, GERD, gastric polyps who was off Eliquis for endoscopic resection on 01/21/20. After discharge to home, he developed significant hypotension with dizziness and falls.  Heart rate in 1 7200 range on evaluation by EMS and he was cardioverted x2 and found to have blown right pupil.  Exam in ED revealed cogwheeling right greater than left extremities with RLE weakness.  CT head was negative for acute changes MRI/MRI brain done revealing subtle diffusion abnormality involving ventral left medulla with question of subtle infarct.  CT chest showed diffuse infiltrate consistent with extensive left-sided pneumonia and he was also noted to have leukocytosis with white count 15.6 and hypoxia at admission.  He was started on Unasyn and transitioned to Augmentin to continue 10 total days of antibiotic regimen.  Dr.  Einar Gip was consulted for input on cardiac enzymes and felt that this was related to cardioversion and A. fib.  Metabolic acidosis has improved with hydration and Coreg was titrated upward for rate control.  Dr. Erlinda Hong felt the stroke was due to small vessel disease in setting of sepsis with septic shock and to resume Eliquis at CKD dose of 2.5 mg twice daily.  He did have a drop in his hemoglobin to 6.9 and was transfused with 1 unit of PRBCs.  Blood pressures were noted to be labile and hydralazine was titrated upwards.  Therapy initiated and CIR was recommended due to functional decline.   Hospital Course: [name] was admitted to rehab 01/29/2020 for inpatient therapies to consist of PT and OT at least three hours five days a week. Past admission physiatrist, therapy team and rehab RN have worked together to provide customized collaborative inpatient rehab.  Respiratory status has been stable and he completed 10/10 days antibiotic regimen on 10/29.  Heart rate has been controlled on current dose of Coreg.  His blood pressures were monitored on TID basis and have been reasonably controlled off of hydralazine.  Renal status has been monitored with serial check and serum creatinine is down to 3.28 with BUN- 64.  CBC has also been monitored with serial checks and he will was noted to have drop in H&H to 6.9 on 11/01 and was transfused with 1 unit PRBC.  Stool guaiacs x2 have been negative.    Most recent CBC prior to discharge shows H&H of 7.8 and 25.5 which wife reports is at baseline.  Home health RN to draw CBC on Thursday with results to Dr. Ivery Quale.  He is to follow-up with nephrology for further input on IV iron/Aranesp for supplementation after discharge.  His p.o. intake has been good and he is continent of bowel and bladder.  Senna S is being used at bedtime to help manage chronic constipation.  Impaired fasting blood sugar noted with hemoglobin A1c of 6.1.  Wife/patient aware of need to follow carb  modified/renal diet after discharge.  He has made good gains during his rehab stay with improvement in RUE issues however continues to have inattention with decrease in safety awareness.  24 supervision is recommended at discharge.  He will continue to receive further follow-up home health PT, OT and RN by [facility] after discharge   Rehab course: During patient's stay in rehab weekly team conferences were held to monitor patient's progress, set goals and discuss barriers to discharge. At admission, patient required mod assist with basic ADL task and with mobility. He has had improvement in activity tolerance, balance, postural control as well as ability to compensate for deficits.  Mental coordination.  He requires supervision to min assist for UB/LB dressing.  He requires supervision for transfers and to ambulate to 20 feet with rolling walker and verbal cues to attend to right sided obstacle as well as poor right foot clearance and for safe use of DME.  Family education was completed with wife.  Disposition: 01-Home or Self Care  Diet: Heart healthy/Diabetic diet.  Special Instructions: 1. 24 hours supervision for safety and no driving till cleared by Neurology. 2. Repeat BMET/CBC in 5-7 days to monitor for trend. 3. Needs follow up with nephrology in 1-2 weeks.    Discharge Instructions    Ambulatory referral to Neurology   Complete by: As directed    An appointment is requested in approximately: 3-4 weeks   Ambulatory referral to Physical Medicine Rehab   Complete by: As directed    1-2 weeks TC appt   DME Bedside commode   Complete by: As directed    Patient needs a bedside commode to treat with the following condition: Stroke (cerebrum) (HCC)     Allergies as of 02/11/2020      Reactions   Lisinopril Cough   Metformin And Related Other (See Comments)   chronic kidney disease      Medication List    STOP taking these medications   amoxicillin-clavulanate 500-125 MG  tablet Commonly known as: AUGMENTIN   furosemide 40 MG tablet Commonly known as: LASIX   loratadine 10 MG tablet Commonly known as: CLARITIN   losartan 100 MG tablet Commonly known as: COZAAR   polycarbophil 625 MG tablet Commonly known as: FIBERCON     TAKE these medications   acetaminophen 325 MG tablet Commonly known as: TYLENOL Take 1-2 tablets (325-650 mg total) by mouth every 4 (four) hours as needed for mild pain.   albuterol 108 (90 Base) MCG/ACT inhaler Commonly known as: VENTOLIN HFA Inhale 2 puffs into the lungs every 6 (six) hours as needed for wheezing or shortness of breath.   amLODipine 10 MG tablet Commonly known as: NORVASC Take 10 mg by mouth daily.   apixaban 2.5 MG Tabs tablet Commonly known as: ELIQUIS Take 1 tablet (2.5 mg total) by mouth 2 (two) times daily.  atorvastatin 80 MG tablet Commonly known as: LIPITOR Take 1 tablet (80 mg total) by mouth daily at 6 PM.   calcitRIOL 0.25 MCG capsule Commonly known as: ROCALTROL Take 0.25 mcg by mouth daily.   carvedilol 12.5 MG tablet Commonly known as: COREG Take 1 tablet (12.5 mg total) by mouth 2 (two) times daily with a meal.   cholecalciferol 25 MCG (1000 UNIT) tablet Commonly known as: VITAMIN D Take 1,000 Units by mouth daily.   docusate sodium 50 MG capsule Commonly known as: COLACE Take 100 mg by mouth daily as needed for mild constipation.   doxazosin 4 MG tablet Commonly known as: CARDURA Take 1 tablet (4 mg total) by mouth every 12 (twelve) hours.   ferrous sulfate 325 (65 FE) MG tablet Take 325 mg by mouth daily.   finasteride 5 MG tablet Commonly known as: PROSCAR Take 5 mg by mouth daily.   fluticasone 50 MCG/ACT nasal spray Commonly known as: FLONASE Place 2 sprays into both nostrils daily as needed for allergies or rhinitis.   hydrALAZINE 25 MG tablet Commonly known as: APRESOLINE Take 3 tablets (75 mg total) by mouth every 8 (eight) hours.   lactulose 10 GM/15ML  solution Commonly known as: CHRONULAC Take 10 g by mouth daily as needed for mild constipation or moderate constipation.   omeprazole 40 MG capsule Commonly known as: PRILOSEC Take 1 capsule (40 mg total) by mouth 2 (two) times daily before a meal.   RA SALINE ENEMA RE Place 1 application rectally as needed (take every other day as needed for constipation).   sennosides-docusate sodium 8.6-50 MG tablet Commonly known as: SENOKOT-S Take 1 tablet by mouth daily after supper. What changed:   when to take this  reasons to take this Notes to patient: To avoid constipation   sucralfate 1 GM/10ML suspension Commonly known as: Carafate Take 10 mLs (1 g total) by mouth 2 (two) times daily. Take once during the day and before bedtime.  Try to no take any other medications 1 hour before or after use of Carafate.   vitamin B-12 500 MCG tablet Commonly known as: CYANOCOBALAMIN Take 1,000 mcg by mouth daily.            Durable Medical Equipment  (From admission, onward)         Start     Ordered   02/11/20 0000  DME Bedside commode       Question:  Patient needs a bedside commode to treat with the following condition  Answer:  Stroke (cerebrum) ([place])   02/11/20 1150          Follow-up Information    Lovorn, Jinny Blossom, MD Follow up.   Specialty: Physical Medicine and Rehabilitation Why: Office will call you with follow up appointment Contact information: [address] [place] [address] [phone]        Charlsie Merles, MD. Call in 1 day(s).   Specialty: Internal Medicine Why: for post hospital follow up and repeat CBC in 5-7 days Contact information: [place] [address] [phone]        Mansouraty, Telford Nab., MD. Call.   Specialties: Gastroenterology, Internal Medicine Why: for follow up appointment Contact information: [place] [address] [phone]                Signed: Bary Leriche 02/11/2020, 3:37 PM

## 2020-02-11 NOTE — Progress Notes (Signed)
Patient's current progress, medical issues--repeat Hgb 7.8 as well as CKD, MD follow up and recommendations for diet/supervision discussed with wife and his sister in law. Wife reports that his Hgb has been around 7 for the past year when he had IV iron and discussion ongoing on pros/cons of Epogen.  He has follow up with Neurology on 11/10 for evaluation of tremors and gait disorder. Gait has greatly improved --"now almost running" compaired to past year per wife.  She prefers to follow up with Cards, Nephro, Neuro and PMR at the [facility] after discharge. Will send discharge summary sent to PCP at [facility].

## 2020-02-11 NOTE — Plan of Care (Signed)
PT Goal summary Problem: RH Balance Goal: LTG Patient will maintain dynamic sitting balance (PT) Description: LTG:  Patient will maintain dynamic sitting balance with assistance during mobility activities (PT) Outcome: Completed/Met Goal: LTG Patient will maintain dynamic standing balance (PT) Description: LTG:  Patient will maintain dynamic standing balance with assistance during mobility activities (PT) Outcome: Completed/Met   Problem: Sit to Stand Goal: LTG:  Patient will perform sit to stand with assistance level (PT) Description: LTG:  Patient will perform sit to stand with assistance level (PT) Outcome: Completed/Met   Problem: RH Car Transfers Goal: LTG Patient will perform car transfers with assist (PT) Description: LTG: Patient will perform car transfers with assistance (PT). Outcome: Not Met (add Reason) Note: Not met due to difficulty rising from low seat   Problem: RH Furniture Transfers Goal: LTG Patient will perform furniture transfers w/assist (OT/PT) Description: LTG: Patient will perform furniture transfers  with assistance (OT/PT). Outcome: Completed/Met   Problem: RH Ambulation Goal: LTG Patient will ambulate in controlled environment (PT) Description: LTG: Patient will ambulate in a controlled environment, # of feet with assistance (PT). Outcome: Completed/Met Goal: LTG Patient will ambulate in home environment (PT) Description: LTG: Patient will ambulate in home environment, # of feet with assistance (PT). Outcome: Completed/Met   Problem: RH Stairs Goal: LTG Patient will ambulate up and down stairs w/assist (PT) Description: LTG: Patient will ambulate up and down # of stairs with assistance (PT) Outcome: Completed/Met  Magda Kiel, PT

## 2020-02-11 NOTE — Plan of Care (Signed)
  Problem: Consults Goal: RH STROKE PATIENT EDUCATION Description: See Patient Education module for education specifics  Outcome: Completed/Met   Problem: RH BOWEL ELIMINATION Goal: RH STG MANAGE BOWEL WITH ASSISTANCE Description: STG Manage Bowel with Mod I Assistance. Outcome: Completed/Met   Problem: RH BLADDER ELIMINATION Goal: RH STG MANAGE BLADDER WITH ASSISTANCE Description: STG Manage Bladder With Mod I Assistance Outcome: Completed/Met   Problem: RH SAFETY Goal: RH STG ADHERE TO SAFETY PRECAUTIONS W/ASSISTANCE/DEVICE Description: STG Adhere to Safety Precautions With Mod I Assistance/Device. Outcome: Completed/Met Goal: RH STG DECREASED RISK OF FALL WITH ASSISTANCE Description: STG Decreased Risk of Fall With Mod I Assistance. Outcome: Completed/Met   Problem: RH PAIN MANAGEMENT Goal: RH STG PAIN MANAGED AT OR BELOW PT'S PAIN GOAL Description: <2 Outcome: Completed/Met

## 2020-02-11 NOTE — Progress Notes (Signed)
[facility] PHYSICAL MEDICINE & REHABILITATION PROGRESS NOTE   Subjective/Complaints:  Hb down to 7.3 Cr up slightly to 3.49-  Needs to see renal doctor after d/c.   Also, no BM "lately" per pt- but refuses bowel meds- doesn't want to go /have accident around d/c.    ROS:   Pt denies SOB, abd pain, CP, N/V/C/D, and vision changes   Objective:   No results found. Recent Labs    02/11/20 0749  WBC 6.0  HGB 7.3*  HCT 23.6*  PLT 242   Recent Labs    02/11/20 0749  NA 136  K 5.1  CL 110  CO2 19*  GLUCOSE 195*  BUN 63*  CREATININE 3.49*  CALCIUM 8.4*    Intake/Output Summary (Last 24 hours) at 02/11/2020 0935 Last data filed at 02/10/2020 2118 Gross per 24 hour  Intake 450 ml  Output --  Net 450 ml   Physical Exam: Vital Signs Blood pressure (!) 153/66, pulse 60, temperature 98.3 F (36.8 C), resp. rate 18, height 6\' 1"  (1.854 m), weight 102.4 kg, SpO2 98 %.  General: alert, knew d/c was today- sitting up in bed; appropriate, NAD HEENT: conjugate gaze,  Neck: Supple without JVD or lymphadenopathy Heart: RRR Chest: CTA B/L- no W/R/R- good air movement Abdomen: Soft, NT, ND, (+)BS - hypoactive  Ext: B/L LE's1-2+ pitting edema to mid calf B/L Psych: pleasant, appropriate, but slowed processing Neurological: more alert, appropriate  Mild dysarthria noted. Intentional tremors noted BUE for a few seconds this AM Moves all 4's but MMT difficult d/t inconsistent participation. Senses pain in all 4's.   Assessment/Plan: 1. 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  Physiatrist is providing close team supervision and 24 hour management of active medical problems listed below.  Physiatrist and rehab team continue to assess barriers to discharge/monitor patient progress toward functional and medical goals  Care Tool:  Bathing     Body parts bathed by patient: Right arm, Left arm, Chest, Abdomen, Front perineal area, Right upper leg, Left upper leg, Face, Left lower leg, Right lower leg   Body parts bathed by helper: Buttocks     Bathing assist Assist Level:  (per most recent staff documentation)     Upper Body Dressing/Undressing Upper body dressing   What is the patient wearing?: Pull over shirt    Upper body assist Assist Level:  (per most recent staff documentation)    Lower Body Dressing/Undressing Lower body dressing      What is the patient wearing?: Pants, Underwear/pull up     Lower body assist Assist for lower body dressing:  (per most recent staff documentation)     Toileting Toileting    Toileting assist Assist for toileting: Supervision/Verbal cueing (per most recent staff documentation)     Transfers Chair/bed transfer  Transfers assist     Chair/bed transfer assist level: Supervision/Verbal cueing Chair/bed transfer assistive device: Programmer, multimedia   Ambulation assist      Assist level: Supervision/Verbal cueing Assistive device: Walker-rolling Max distance: 200'   Walk 10 feet activity   Assist     Assist level: Supervision/Verbal cueing Assistive device: Walker-rolling   Walk 50 feet activity   Assist    Assist level: Supervision/Verbal cueing Assistive device: Walker-rolling    Walk 150 feet activity   Assist Walk 150 feet activity did not occur: Safety/medical concerns  Assist level: Supervision/Verbal cueing Assistive device: Walker-rolling    Walk 10 feet on uneven surface  activity   Assist     Assist level: Supervision/Verbal cueing Assistive device: Aeronautical engineer Will patient use wheelchair at discharge?: No Type of Wheelchair: Manual    Wheelchair assist level: Minimal Assistance - Patient > 75% Max wheelchair distance: 10'    Wheelchair 50 feet with 2 turns  activity    Assist        Assist Level: Total Assistance - Patient < 25%   Wheelchair 150 feet activity     Assist      Assist Level: Total Assistance - Patient < 25%   Blood pressure (!) 153/66, pulse 60, temperature 98.3 F (36.8 C), resp. rate 18, height 6\' 1"  (1.854 m), weight 102.4 kg, SpO2 98 %.    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 -patient may shower -ELOS/Goals: 9-12 days, supervision to mod I with PT/OT   -Continue CIR 2. Antithrombotics: -DVT/anticoagulation:Pharmaceutical:Other (comment)--Eliquis 11/1- has hx of DVT- cannot stop- Hb dropped to 6.9 11/4- Hb up to 8.4- will monitor 11/8- Hb down to 7.3- will need ASAP appointment with PCP or H/h to draw labs/send to PCP at d/c since Hb still dropping. Might need to stop Eliquis? -antiplatelet therapy: N/A 3. Pain Management:tylenol prn for chronic LBP  11/7: pain controlled, con't tylenol prn 4. Mood:LCSW to follow for evaluation and support. -antipsychotic agents: N/A 5. Neuropsych: This patientis intermittentlycapable of making decisions onhisown behalf. 6. Skin/Wound Care:Routine pressure relief measures. 7. Fluids/Electrolytes/Nutrition:Monitor I/O. Check lytes in am.  8. Aspiration PNA?:   Augmentin for 10 days  10/28- completed Augmentin 9. HTN: Monitor BP tid--continue Norvasc, coreg bid, Cardura bid, Proscar and Hydralazine tid.   10/28-BP 156/71- somewhat elevated, but was OK yesterday- will monitor x 24 hours and see if needs ot change meds  11/4- BP 155/70- slightly elevated, but isn't usually- will con't  11/6-7: BP has been slightly elevated, HR stable  11/8- pt mentates better with BP slightly high, FYI 10. GERD/gastric polyps: On Protonix. 11. PAF: Monitor HR tid --on coreg and Eliquis.. HR stable 12.Acute on chronic anemia: Will continue to monitor  H/H. Recheck CBC in am. Continue iron supplement.  10/27- Hb dropped slightly to 7.6- will recheck and monitor closely  11/1- Hb 6.9- transfuse 1 unit pRBCs- and check occult blood. .   11/2: hgb 8.2  11/8- Hb down to 7.3 again- might need to stop Eliquis- let PCP make that call 13. Constipation  10/28- has been 3+ days- will give Sorbitol- if doesn't work, will give Mg citrate to clean him out.   10/29- Cleaned out  11/1- LBM 2-3 daysa go- will give Sorbitol again today  11/2- very large BM yesterday- pt unaware of going, of note.   11/4- LBM yesterday  11/8- doesn't want bowel meds even though constipated- doesn't want bowel accident at d/c.  14. 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   10/29- Cr up to 2.78- last was 2.64- could be previous ABX- will recheck Monday- per nursing, drinking OK.   11/1- Cr 3.47- however Hb 6.9- will transfuse and recheck in AM- if still elevated, give IVFs tomorrow- don't think drinking well  11/2- Cr down to 3.28- from 3.47- will give IVFs NS 75cc/hour from 4pm this afternoon to 8am tomorrow AM to avoid giving with therapy- and recheck in AM  11/3-  S/p IVFs- Cr didn't move basically at all- BUN also stable after IVFs. - remembers to drink 6 cups/day; pushing PO fluids- appears that is at new baseline Cr- low 3's.  11/8- Cr 3.49- up again- need to see nephrology 15. LE edema  11/4- will order TEDs for LE edema- and monitor   11/5- stable- con't regimen  Spoke to wife at length- pt AND wife do NOT want any DM meds at all- he's had too many issues with hypoglycemia in past to want to take anything- also don't want to change from heart healthy diet to diabetic diet- no changes will be made/ no addition of DM meds with be made, regardless, per wife.   LOS: 13 days A FACE TO FACE EVALUATION WAS PERFORMED  Noela Brothers 02/11/2020, 9:35 AM

## 2020-02-11 NOTE — Discharge Instructions (Signed)
Inpatient Rehab Discharge Instructions  Todd [name] Discharge date and time:  02/11/20  Activities/Precautions/ Functional Status: Activity: no lifting, driving, or strenuous exercise till cleared by MD Diet: cardiac diet/diabetic restrictions.  Wound Care: none needed   Functional status:  ___ No restrictions     ___ Walk up steps independently _X__ 24/7 supervision/assistance   ___ Walk up steps with assistance ___ Intermittent supervision/assistance  ___ Bathe/dress independently ___ Walk with walker     ___ Bathe/dress with assistance ___ Walk Independently    ___ Shower independently ___ Walk with assistance    _X__ Shower with assistance _X__ No alcohol     ___ Return to work/school ________  COMMUNITY REFERRALS UPON DISCHARGE:    Home Health:   PT     OT     ST                  Agency: [facility] Phone: [phone]   Medical Equipment/Items Ordered: Shower Chair                                                  Agency/Supplier: Spouse to [employer] [facility] [employer] ([address], [place], [place] [address])   Special Instructions:    STROKE/TIA DISCHARGE INSTRUCTIONS SMOKING Cigarette smoking nearly doubles your risk of having a stroke & is the single most alterable risk factor  If you smoke or have smoked in the last 12 months, you are advised to quit smoking for your health.  Most of the excess cardiovascular risk related to smoking disappears within a year of stopping.  Ask you doctor about anti-smoking medications  [place] Quit Line: 1-800-QUIT NOW  Free Smoking Cessation Classes [phone]  CHOLESTEROL Know your levels; limit fat & cholesterol in your diet  Lipid Panel     Component Value Date/Time   CHOL 124 01/23/2020 1005   TRIG 57 01/23/2020 1005   HDL 64 01/23/2020 1005   CHOLHDL 1.9 01/23/2020 1005   VLDL 11 01/23/2020 1005   LDLCALC 49 01/23/2020 1005      Many patients benefit from treatment even if their cholesterol is at  goal.  Goal: Total Cholesterol (CHOL) less than 160  Goal:  Triglycerides (TRIG) less than 150  Goal:  HDL greater than 40  Goal:  LDL (LDLCALC) less than 100   BLOOD PRESSURE American Stroke Association blood pressure target is less that 120/80 mm/Hg  Your discharge blood pressure is:  BP: (!) 147/65  Monitor your blood pressure  Limit your salt and alcohol intake  Many individuals will require more than one medication for high blood pressure  DIABETES (A1c is a blood sugar average for last 3 months) Goal HGBA1c is under 7% (HBGA1c is blood sugar average for last 3 months)  Diabetes:     Lab Results  Component Value Date   HGBA1C 6.1 (H) 01/23/2020     Your HGBA1c can be lowered with medications, healthy diet, and exercise.  Check your blood sugar as directed by your physician  Call your physician if you experience unexplained or low blood sugars.  PHYSICAL ACTIVITY/REHABILITATION Goal is 30 minutes at least 4 days per week  Activity: No driving, Therapies: see above Return to work: N/a  Activity decreases your risk of heart attack and stroke and makes your heart stronger.  It  helps control your weight and blood pressure; helps you relax and can improve your mood.  Participate in a regular exercise program.  Talk with your doctor about the best form of exercise for you (dancing, walking, swimming, cycling).  DIET/WEIGHT Goal is to maintain a healthy weight  Your discharge diet is:  Diet Order            Diet Heart Room service appropriate? Yes; Fluid consistency: Thin  Diet effective now                 liquids Your height is:  Height: 6\' 1"  (185.4 cm) Your current weight is: Weight: 216 kg Your Body Mass Index (BMI) is:  BMI (Calculated): 28.6  Following the type of diet specifically designed for you will help prevent another stroke.  Your goal weight is: 181 lbs  Your goal Body Mass Index (BMI) is 19-24.  Healthy food habits can help reduce 3 risk factors  for stroke:  High cholesterol, hypertension, and excess weight.  RESOURCES Stroke/Support Group:  Call [phone]   STROKE EDUCATION PROVIDED/REVIEWED AND GIVEN TO PATIENT Stroke warning signs and symptoms How to activate emergency medical system (call 911). Medications prescribed at discharge. Need for follow-up after discharge. Personal risk factors for stroke. Pneumonia vaccine given:  Flu vaccine given:  My questions have been answered, the writing is legible, and I understand these instructions.  I will adhere to these goals & educational materials that have been provided to me after my discharge from the hospital.     My questions have been answered and I understand these instructions. I will adhere to these goals and the provided educational materials after my discharge from the hospital.  Patient/Caregiver Signature _______________________________ Date __________  Clinician Signature _______________________________________ Date __________  Please bring this form and your medication list with you to all your follow-up doctor's appointments.

## 2020-02-13 ENCOUNTER — Telehealth: Payer: Self-pay | Admitting: Registered Nurse

## 2020-02-13 NOTE — Telephone Encounter (Signed)
Transitional Care call Transitions Questions answered by Ms. Noemi Chapel (   Patient name: Todd [name]  DOB: 04-Jan-1941 1. Are you/is patient experiencing any problems since coming home? No a. Are there any questions regarding any aspect of care? No 2. Are there any questions regarding medications administration/dosing? No a. Are meds being taken as prescribed? Yes b. "Patient should review meds with caller to confirm" Medication List Reviewed 3. Have there been any falls? No 4. Has Home Health been to the house and/or have they contacted you? Yes, [facility] a. If not, have you tried to contact them? NA b. Can we help you contact them? NA 5. Are bowels and bladder emptying properly? Yes a. Are there any unexpected incontinence issues? No b. If applicable, is patient following bowel/bladder programs? NA 6. Any fevers, problems with breathing, unexpected pain? No 7. Are there any skin problems or new areas of breakdown? No 8. Has the patient/family member arranged specialty MD follow up (ie cardiology/neurology/renal/surgical/etc.)?  Ms. Donna Christen states [name] went to his Neurology appointment at the [facility] today, she will call Dr Rondell Reams and Dr Rush Landmark to schedule HFU appointments.  a. Can we help arrange? No 9. Does the patient need any other services or support that we can help arrange? No 10. Are caregivers following through as expected in assisting the patient? Yes 11. Has the patient quit smoking, drinking alcohol, or using drugs as recommended? (                        )  Appointment date/time 02/21/2020  arrival time 12:30 for 1:00 appointment with Bayard Hugger ANP-C. At  [place]

## 2020-02-14 ENCOUNTER — Telehealth: Payer: Self-pay

## 2020-02-15 NOTE — Telephone Encounter (Signed)
Yesterday afternoon:  Dedra RN called to report Todd [name] decline the nurse visit for medication reconciliation for Todd [name].   I called Todd [name] for an update. Per Todd [name] she did declined the visit. She explained to me that the nurse was all over the house. Looking for Todd [name]'s medication.  She did state his medications does need to organized. But was not happy with the RN visit.    I spoke with the Dedra and she will reach out to Todd [name] & Todd [name] again.   (Matter already addressed by Todd [name] today).

## 2020-02-21 ENCOUNTER — Other Ambulatory Visit: Payer: Self-pay

## 2020-02-21 ENCOUNTER — Encounter: Payer: Medicare PPO | Attending: Registered Nurse | Admitting: Registered Nurse

## 2020-02-21 ENCOUNTER — Encounter: Payer: Self-pay | Admitting: Registered Nurse

## 2020-02-21 ENCOUNTER — Telehealth: Payer: Self-pay | Admitting: *Deleted

## 2020-02-21 VITALS — BP 160/72 | HR 60 | Ht 70.0 in | Wt 216.0 lb

## 2020-02-21 DIAGNOSIS — I10 Essential (primary) hypertension: Secondary | ICD-10-CM

## 2020-02-21 DIAGNOSIS — I6381 Other cerebral infarction due to occlusion or stenosis of small artery: Secondary | ICD-10-CM

## 2020-02-21 DIAGNOSIS — I6330 Cerebral infarction due to thrombosis of unspecified cerebral artery: Secondary | ICD-10-CM

## 2020-02-21 NOTE — Telephone Encounter (Signed)
After Zella Ball saw Mr Rawl today and his wife was upset because he had not had any therapy from [facility]. She asked me to call Alvis Lemmings and speak to them about what happened with the [facility] services.  According to [facility], [employer], it was Dedra ST that went out to open the case. Mrs Fluegel refused to sign and further [facility] service, so the case was not accepted.  Zella Ball spoke with [facility] as well and will address further with Erlene Quan LCSW  and Dr Dagoberto Ligas.

## 2020-02-21 NOTE — Progress Notes (Signed)
Subjective:    Patient ID: Todd [name], male    DOB: May 27, 1940, 79 y.o.   MRN: [id number]  HPI: Todd [name] is a 79 y.o. male who is here for Transitional Care Visit for follow up of his stroke and essential hypertension. Todd [name] presented to [facility] ED on 01/21/2020 with altered mental status and blown pupils shortly after EGD. Todd [name] was tachycardic 170-200 and EMS attempted cardioversion x2 without any change. Neurology and Cardiology was consulted.  CT Head Code Stroke WO Contrast:  IMPRESSION: 1. No acute intracranial abnormality CT Chest WO Contrast:  IMPRESSION: Diffuse left-sided pneumonia.  Cholelithiasis without complicating factors.  Aortic Atherosclerosis (ICD10-I70.0).  MRI Head:  IMPRESSION: MRI HEAD IMPRESSION:  1. Subtle 4 mm focus of diffusion abnormality involving the ventral left medulla. While this finding could be artifactual in nature, a possible subtle acute ischemic infarct is difficult to exclude, and could be considered in the correct clinical setting. No associated hemorrhage or mass effect. 2. Otherwise negative brain MRI for age.  MRA HEAD IMPRESSION:  1. Mildly motion degraded exam. 2. Negative intracranial MRA for large vessel occlusion. No hemodynamically significant or correctable stenosis.  Todd [name] was admitted to inpatient rehabilitation on 01/29/2020 and discharged home on 02/11/2020. Todd [name] was suppose to have outpatient Therapy with [facility]. Ms. Noemi Chapel ( wife) states she had an issue with the speech therapist Dedra who came to their home and no one has returned. This provider called [facility] and spoke to the Director [place], she stated Ms. Donna Christen had refused to signed the appropriate paperwork. Ms. Donna Christen had a different story, this provider reached out to [employer] social worker from inpatient rehabilitation floor for assistance. Ms. Sampson Goon also spoke with Ms.Garrett about  the event that occur in their home and she will assist with another agency. [facility] referral was placed.  Mr. Merkey states he has a headache today. He rates his pain 5. He arrived in wheelchair. Also reports he has a good appetite.   Pain Inventory Average Pain 5 Pain Right Now 5 My pain is burning  LOCATION OF PAIN  head  BOWEL Number of stools per week: 7 Oral laxative use Yes  Type of laxative senna Enema or suppository use No  History of colostomy No  Incontinent No   BLADDER Pads In and out cath, frequency n/a Able to self cath n/a Bladder incontinence No  Frequent urination No  Leakage with coughing No  Difficulty starting stream No  Incomplete bladder emptying Yes    Mobility walk with assistance use a walker use a wheelchair needs help with transfers Do you have any goals in this area?  yes  Function retired  Neuro/Psych weakness tremor trouble walking dizziness depression  Prior Studies tc  Physicians involved in your care tc   Family History  Problem Relation Age of Onset  . Diabetes Mother   . Diabetes Father   . Stroke Father   . Colon cancer Father   . Diabetes Sister   . Stomach cancer Neg Hx   . Pancreatic cancer Neg Hx   . Esophageal cancer Neg Hx   . Inflammatory bowel disease Neg Hx   . Liver disease Neg Hx    Social History   Socioeconomic History  . Marital status: Married    Spouse name: Not on file  . Number of children: 2  . Years of education: Not on file  . Highest education level:  Not on file  Occupational History  . Occupation: retired  Tobacco Use  . Smoking status: Never Smoker  . Smokeless tobacco: Never Used  Vaping Use  . Vaping Use: Never used  Substance and Sexual Activity  . Alcohol use: No  . Drug use: No  . Sexual activity: Not Currently    Partners: Female  Other Topics Concern  . Not on file  Social History Narrative  . Not on file   Social Determinants of Health    Financial Resource Strain:   . Difficulty of Paying Living Expenses: Not on file  Food Insecurity:   . Worried About Charity fundraiser in the Last Year: Not on file  . Ran Out of Food in the Last Year: Not on file  Transportation Needs:   . Lack of Transportation (Medical): Not on file  . Lack of Transportation (Non-Medical): Not on file  Physical Activity:   . Days of Exercise per Week: Not on file  . Minutes of Exercise per Session: Not on file  Stress:   . Feeling of Stress : Not on file  Social Connections:   . Frequency of Communication with Friends and Family: Not on file  . Frequency of Social Gatherings with Friends and Family: Not on file  . Attends Religious Services: Not on file  . Active Member of Clubs or Organizations: Not on file  . Attends Archivist Meetings: Not on file  . Marital Status: Not on file   Past Surgical History:  Procedure Laterality Date  . COLONOSCOPY WITH ESOPHAGOGASTRODUODENOSCOPY (EGD)  04/2019  . ENDOSCOPIC MUCOSAL RESECTION  01/21/2020   Procedure: ENDOSCOPIC MUCOSAL RESECTION;  Surgeon: Rush Landmark Telford Nab., MD;  Location: [facility];  Service: Gastroenterology;;  . ESOPHAGOGASTRODUODENOSCOPY N/A 10/17/2014   Procedure: ESOPHAGOGASTRODUODENOSCOPY (EGD);  Surgeon: Wilford Corner, MD;  Location[facility] ENDOSCOPY;  Service: Endoscopy;  Laterality: N/A;  . ESOPHAGOGASTRODUODENOSCOPY (EGD) WITH PROPOFOL N/A 01/21/2020   Procedure: ESOPHAGOGASTRODUODENOSCOPY (EGD) WITH PROPOFOL;  Surgeon: Rush Landmark Telford Nab., MD;  Location: [place];  Service: Gastroenterology;  Laterality: N/A;  . HEMOSTASIS CLIP PLACEMENT  01/21/2020   Procedure: HEMOSTASIS CLIP PLACEMENT;  Surgeon: Irving Copas., MD;  Location: [place];  Service: Gastroenterology;;  . multiple tooth implants     pt thinks sedation  . SUBMUCOSAL LIFTING INJECTION  01/21/2020   Procedure: SUBMUCOSAL LIFTING INJECTION;  Surgeon: Rush Landmark Telford Nab., MD;   [facility] ENDOSCOPY;  Service: Gastroenterology;;   Past Medical History:  Diagnosis Date  . Anemia   . Asthma    history of asthma  . Atrial fibrillation ([place])   . Chronic kidney disease   . 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 GERD (gastroesophageal reflux disease)   . History of atrial fibrillation, S/P EP ablation   . HLD (hyperlipidemia)   . Hypertension   . Seasonal allergies   . TIA (transient ischemic attack) 06/15/2017   BP (!) 160/72   Pulse 60   Ht 5\' 10"  (1.778 m)   Wt 216 lb (98 kg)   SpO2 98%   BMI 30.99 kg/m   Opioid Risk Score:   Fall Risk Score:  `1  Depression screen PHQ 2/9  No flowsheet data found.  Review of Systems  Constitutional: Positive for unexpected weight change.  Respiratory: Positive for shortness of breath and wheezing.   Cardiovascular: Positive for leg swelling.  Gastrointestinal: Negative.   Genitourinary: Negative.   Musculoskeletal: Positive for gait  problem.  Neurological: Positive for dizziness, tremors, weakness and headaches.  Hematological: Bruises/bleeds easily.  Psychiatric/Behavioral: Positive for dysphoric mood.  All other systems reviewed and are negative.      Objective:   Physical Exam Vitals and nursing note reviewed.  Constitutional:      Appearance: Normal appearance.  Cardiovascular:     Rate and Rhythm: Normal rate and regular rhythm.     Pulses: Normal pulses.     Heart sounds: Normal heart sounds.  Pulmonary:     Effort: Pulmonary effort is normal.     Breath sounds: Normal breath sounds.  Musculoskeletal:     Cervical back: Normal range of motion and neck supple.     Comments: Normal Muscle Bulk and Muscle Testing Reveals:  Upper Extremities: Right: Decreased  ROM 30 Degrees  and Muscle Strength 5/5 Left: Full ROM and Muscle Strength 5/5 Lumbar Paraspinal Tenderness: L-4-L-5 Lower Extremities: Anasarca Noted Arrived in wheelchair  Skin:    General: Skin is  warm and dry.  Neurological:     Mental Status: He is alert and oriented to person, place, and time.  Psychiatric:        Mood and Affect: Mood normal.        Behavior: Behavior normal.           Assessment & Plan:  1. Stroke: [facility]: Neurology Following. Continue Home Health Therapy with [facility]. Continue current medication regimen. 2. Essential Hypertension: Continue current medication regimen. PCP following. Continue to monitor  30 minutes of face to face patient care time was spent during this visit. All questions were encouraged and answered.  F/U in 4- 6 weeks with Dr Dagoberto Ligas

## 2020-02-21 NOTE — Progress Notes (Signed)
Patient ID: Todd [name], male   DOB: 19-Oct-1940, 79 y.o.   MRN: [id number]   SW received update that patient spouse reports not having anyone come for [facility] since 11/11. NP Zella Ball) followed up with family. Spouse reports that speech therapist was rude and roaming patients home. Spouse did ask therapist to leave. Zella Ball followed up with [employer] at [employer]. Company has not returned due to spouse refusing to sign paperwork. Due to paperwork not being signed patients case was declined.  SW has attempted to follow up with patient spouse to hear concerns and educate spouse of therapist role and importance of signature of paperwork. SW left voicemail and will await follow up. SW in process of attempting to get patient set up with another company.   [place], [place]

## 2020-03-04 NOTE — Telephone Encounter (Signed)
Patient wife called back needing to know the name of the new home health agency?  Per Margreta Journey it is [facility] phone number [phone]. Information left on  Mrs. Paez's cell phone.   Updated phone contact number left on [facility] voice ID.

## 2020-03-25 ENCOUNTER — Other Ambulatory Visit: Payer: Self-pay

## 2020-03-25 ENCOUNTER — Encounter (HOSPITAL_COMMUNITY): Payer: Self-pay

## 2020-03-25 ENCOUNTER — Emergency Department (HOSPITAL_COMMUNITY)
Admission: EM | Admit: 2020-03-25 | Discharge: 2020-03-26 | Disposition: A | Discharge status: Home / Self Care | Payer: No Typology Code available for payment source | Attending: Emergency Medicine | Admitting: Emergency Medicine

## 2020-03-25 DIAGNOSIS — J45909 Unspecified asthma, uncomplicated: Secondary | ICD-10-CM | POA: Diagnosis not present

## 2020-03-25 DIAGNOSIS — D649 Anemia, unspecified: Secondary | ICD-10-CM

## 2020-03-25 DIAGNOSIS — R609 Edema, unspecified: Secondary | ICD-10-CM

## 2020-03-25 DIAGNOSIS — Z8673 Personal history of transient ischemic attack (TIA), and cerebral infarction without residual deficits: Secondary | ICD-10-CM | POA: Diagnosis not present

## 2020-03-25 DIAGNOSIS — E1122 Type 2 diabetes mellitus with diabetic chronic kidney disease: Secondary | ICD-10-CM | POA: Insufficient documentation

## 2020-03-25 DIAGNOSIS — W19XXXA Unspecified fall, initial encounter: Secondary | ICD-10-CM | POA: Insufficient documentation

## 2020-03-25 DIAGNOSIS — E1165 Type 2 diabetes mellitus with hyperglycemia: Secondary | ICD-10-CM | POA: Diagnosis not present

## 2020-03-25 DIAGNOSIS — Z7951 Long term (current) use of inhaled steroids: Secondary | ICD-10-CM | POA: Insufficient documentation

## 2020-03-25 DIAGNOSIS — E8770 Fluid overload, unspecified: Secondary | ICD-10-CM | POA: Insufficient documentation

## 2020-03-25 DIAGNOSIS — N189 Chronic kidney disease, unspecified: Secondary | ICD-10-CM

## 2020-03-25 DIAGNOSIS — Y92009 Unspecified place in unspecified non-institutional (private) residence as the place of occurrence of the external cause: Secondary | ICD-10-CM | POA: Insufficient documentation

## 2020-03-25 DIAGNOSIS — I129 Hypertensive chronic kidney disease with stage 1 through stage 4 chronic kidney disease, or unspecified chronic kidney disease: Secondary | ICD-10-CM | POA: Diagnosis not present

## 2020-03-25 DIAGNOSIS — N184 Chronic kidney disease, stage 4 (severe): Secondary | ICD-10-CM | POA: Insufficient documentation

## 2020-03-25 DIAGNOSIS — Z7901 Long term (current) use of anticoagulants: Secondary | ICD-10-CM | POA: Insufficient documentation

## 2020-03-25 DIAGNOSIS — R55 Syncope and collapse: Secondary | ICD-10-CM | POA: Diagnosis not present

## 2020-03-25 DIAGNOSIS — R0602 Shortness of breath: Secondary | ICD-10-CM | POA: Diagnosis present

## 2020-03-25 DIAGNOSIS — Z79899 Other long term (current) drug therapy: Secondary | ICD-10-CM | POA: Diagnosis not present

## 2020-03-25 LAB — COMPREHENSIVE METABOLIC PANEL
ALT: 19 U/L (ref 0–44)
AST: 21 U/L (ref 15–41)
Albumin: 3.1 g/dL — ABNORMAL LOW (ref 3.5–5.0)
Alkaline Phosphatase: 72 U/L (ref 38–126)
Anion gap: 10 (ref 5–15)
BUN: 49 mg/dL — ABNORMAL HIGH (ref 8–23)
CO2: 17 mmol/L — ABNORMAL LOW (ref 22–32)
Calcium: 8.3 mg/dL — ABNORMAL LOW (ref 8.9–10.3)
Chloride: 112 mmol/L — ABNORMAL HIGH (ref 98–111)
Creatinine, Ser: 3.43 mg/dL — ABNORMAL HIGH (ref 0.61–1.24)
GFR, Estimated: 17 mL/min — ABNORMAL LOW (ref >60)
Glucose, Bld: 166 mg/dL — ABNORMAL HIGH (ref 70–99)
Potassium: 4.2 mmol/L (ref 3.5–5.1)
Sodium: 139 mmol/L (ref 135–145)
Total Bilirubin: 0.8 mg/dL (ref 0.3–1.2)
Total Protein: 6.3 g/dL — ABNORMAL LOW (ref 6.5–8.1)

## 2020-03-25 LAB — CBC
HCT: 22.8 % — ABNORMAL LOW (ref 39.0–52.0)
Hemoglobin: 7.3 g/dL — ABNORMAL LOW (ref 13.0–17.0)
MCH: 27.4 pg (ref 26.0–34.0)
MCHC: 32 g/dL (ref 30.0–36.0)
MCV: 85.7 fL (ref 80.0–100.0)
Platelets: 188 10*3/uL (ref 150–400)
RBC: 2.66 MIL/uL — ABNORMAL LOW (ref 4.22–5.81)
RDW: 15.9 % — ABNORMAL HIGH (ref 11.5–15.5)
WBC: 5.6 10*3/uL (ref 4.0–10.5)
nRBC: 0 % (ref 0.0–0.2)

## 2020-03-25 LAB — TYPE AND SCREEN
ABO/RH(D): B POS
Antibody Screen: NEGATIVE

## 2020-03-25 LAB — PROTIME-INR
INR: 1.4 — ABNORMAL HIGH (ref 0.8–1.2)
Prothrombin Time: 16.4 seconds — ABNORMAL HIGH (ref 11.4–15.2)

## 2020-03-25 NOTE — ED Triage Notes (Signed)
Patient arrived by [facility] from home after being called by [facility] that HGb 6.8. patient arrived with noted swelling to bilateral lower extremities to legs and right hand swelling. Patient had stroke 1 month ago and has ongoing right sided deficits. Patient alert and oriented. Patient also had reported fall last night at home and denies injury. Denies any pain

## 2020-03-26 ENCOUNTER — Emergency Department (HOSPITAL_COMMUNITY): Payer: No Typology Code available for payment source

## 2020-03-26 LAB — CBC
HCT: 24.9 % — ABNORMAL LOW (ref 39.0–52.0)
Hemoglobin: 7.4 g/dL — ABNORMAL LOW (ref 13.0–17.0)
MCH: 26.3 pg (ref 26.0–34.0)
MCHC: 29.7 g/dL — ABNORMAL LOW (ref 30.0–36.0)
MCV: 88.6 fL (ref 80.0–100.0)
Platelets: 175 10*3/uL (ref 150–400)
RBC: 2.81 MIL/uL — ABNORMAL LOW (ref 4.22–5.81)
RDW: 15.9 % — ABNORMAL HIGH (ref 11.5–15.5)
WBC: 5.5 10*3/uL (ref 4.0–10.5)
nRBC: 0 % (ref 0.0–0.2)

## 2020-03-26 LAB — COMPREHENSIVE METABOLIC PANEL
ALT: 17 U/L (ref 0–44)
AST: 22 U/L (ref 15–41)
Albumin: 3 g/dL — ABNORMAL LOW (ref 3.5–5.0)
Alkaline Phosphatase: 67 U/L (ref 38–126)
Anion gap: 13 (ref 5–15)
BUN: 45 mg/dL — ABNORMAL HIGH (ref 8–23)
CO2: 17 mmol/L — ABNORMAL LOW (ref 22–32)
Calcium: 8.5 mg/dL — ABNORMAL LOW (ref 8.9–10.3)
Chloride: 115 mmol/L — ABNORMAL HIGH (ref 98–111)
Creatinine, Ser: 3.14 mg/dL — ABNORMAL HIGH (ref 0.61–1.24)
GFR, Estimated: 19 mL/min — ABNORMAL LOW (ref >60)
Glucose, Bld: 113 mg/dL — ABNORMAL HIGH (ref 70–99)
Potassium: 4.2 mmol/L (ref 3.5–5.1)
Sodium: 145 mmol/L (ref 135–145)
Total Bilirubin: 1.1 mg/dL (ref 0.3–1.2)
Total Protein: 6.2 g/dL — ABNORMAL LOW (ref 6.5–8.1)

## 2020-03-26 LAB — POC OCCULT BLOOD, ED: Fecal Occult Bld: NEGATIVE

## 2020-03-26 LAB — TROPONIN I (HIGH SENSITIVITY)
Troponin I (High Sensitivity): 36 ng/L — ABNORMAL HIGH (ref <18)
Troponin I (High Sensitivity): 41 ng/L — ABNORMAL HIGH (ref <18)

## 2020-03-26 LAB — BRAIN NATRIURETIC PEPTIDE: B Natriuretic Peptide: 603.4 pg/mL — ABNORMAL HIGH (ref 0.0–100.0)

## 2020-03-26 MED ORDER — FUROSEMIDE 40 MG PO TABS: 40.0000 mg | ORAL_TABLET | Freq: Every day | ORAL | 0 refills | Status: AC

## 2020-03-26 MED ORDER — FUROSEMIDE 20 MG PO TABS
40.0000 mg | ORAL_TABLET | Freq: Once | ORAL | Status: AC
  Administered 2020-03-26: 17:00:00 40 mg via ORAL
  Filled 2020-03-26: qty 2

## 2020-03-26 NOTE — ED Provider Notes (Signed)
[facility] EMERGENCY DEPARTMENT Provider Note   CSN: [id number] Arrival date & time: 03/25/20  1839     History No chief complaint on file. near syncope, fall  Todd [name] is a 79 y.o. male.  The history is provided by the patient and medical records. No language interpreter was used.  Fall This is a recurrent problem. The current episode started 12 to 24 hours ago. The problem occurs rarely. The problem has not changed since onset.Associated symptoms include shortness of breath (exertional). Pertinent negatives include no chest pain, no abdominal pain and no headaches. Nothing aggravates the symptoms. Nothing relieves the symptoms. He has tried nothing for the symptoms. The treatment provided no relief.       Past Medical History:  Diagnosis Date  . Anemia   . Asthma    history of asthma  . Atrial fibrillation ([place])   . Chronic kidney disease   . Diabetes mellitus without complication ([place])    type 2 - no meds  . GERD (gastroesophageal reflux disease)   . History of atrial fibrillation, S/P EP ablation   . HLD (hyperlipidemia)   . Hypertension   . Seasonal allergies   . TIA (transient ischemic attack) 06/15/2017    Patient Active Problem List   Diagnosis Date Noted  . Anxiety 01/25/2020  . Controlled type 2 diabetes mellitus with hyperglycemia ([place])   . Chronic bilateral low back pain without sciatica   . Leukocytosis   . Acute hypoxemic respiratory failure ([place])   . Atrial fibrillation with rapid ventricular response ([place])   . Diabetes mellitus due to underlying condition with stage 3 chronic kidney disease ([place])   . Aspiration pneumonia ([place])   . Respiratory failure, acute-on-chronic ([place]) 01/22/2020  . Stroke ([place]) 01/21/2020  . Normocytic anemia 06/14/2019  . Hyperplastic polyp of stomach 06/14/2019  . Polyp of small intestine 06/14/2019  . Hx of adenomatous colonic polyps 06/14/2019  . GERD (gastroesophageal reflux disease)   . Iron  deficiency anemia 03/21/2019  . History of atrial fibrillation, S/P EP ablation   . TIA (transient ischemic attack) 06/16/2017  . Diabetes mellitus without complication ([place])   . Hypertension   . Chronic kidney disease (CKD), stage IV (severe) ([place])   . Abdominal pain, generalized 10/17/2014    Past Surgical History:  Procedure Laterality Date  . COLONOSCOPY WITH ESOPHAGOGASTRODUODENOSCOPY (EGD)  04/2019  . ENDOSCOPIC MUCOSAL RESECTION  01/21/2020   Procedure: ENDOSCOPIC MUCOSAL RESECTION;  Surgeon: Rush Landmark Telford Nab., MD;  Location: [facility];  Service: Gastroenterology;;  . ESOPHAGOGASTRODUODENOSCOPY N/A 10/17/2014   Procedure: ESOPHAGOGASTRODUODENOSCOPY (EGD);  Surgeon: Wilford Corner, MD;  Location[facility] ENDOSCOPY;  Service: Endoscopy;  Laterality: N/A;  . ESOPHAGOGASTRODUODENOSCOPY (EGD) WITH PROPOFOL N/A 01/21/2020   Procedure: ESOPHAGOGASTRODUODENOSCOPY (EGD) WITH PROPOFOL;  Surgeon: Rush Landmark Telford Nab., MD;  Location: [facility];  Service: Gastroenterology;  Laterality: N/A;  . HEMOSTASIS CLIP PLACEMENT  01/21/2020   Procedure: HEMOSTASIS CLIP PLACEMENT;  Surgeon: Irving Copas., MD;  Location: [facility];  Service: Gastroenterology;;  . multiple tooth implants     pt thinks sedation  . SUBMUCOSAL LIFTING INJECTION  01/21/2020   Procedure: SUBMUCOSAL LIFTING INJECTION;  Surgeon: Rush Landmark Telford Nab., MD;  Location: [facility] ENDOSCOPY;  Service: Gastroenterology;;       Family History  Problem Relation Age of Onset  . Diabetes Mother   . Diabetes Father   . Stroke Father   . Colon cancer Father   . Diabetes Sister   . Stomach cancer Neg  Hx   . Pancreatic cancer Neg Hx   . Esophageal cancer Neg Hx   . Inflammatory bowel disease Neg Hx   . Liver disease Neg Hx     Social History   Tobacco Use  . Smoking status: Never Smoker  . Smokeless tobacco: Never Used  Vaping Use  . Vaping Use: Never used  Substance Use Topics  . Alcohol use: No  .  Drug use: No    Home Medications Prior to Admission medications   Medication Sig Start Date End Date Taking? Authorizing Provider  acetaminophen (TYLENOL) 325 MG tablet Take 1-2 tablets (325-650 mg total) by mouth every 4 (four) hours as needed for mild pain. 02/11/20   Love, Ivan Anchors, PA-C  albuterol (PROVENTIL HFA;VENTOLIN HFA) 108 (90 Base) MCG/ACT inhaler Inhale 2 puffs into the lungs every 6 (six) hours as needed for wheezing or shortness of breath.    [provider]  amLODipine (NORVASC) 10 MG tablet Take 10 mg by mouth daily.    [provider]  apixaban (ELIQUIS) 2.5 MG TABS tablet Take 1 tablet (2.5 mg total) by mouth 2 (two) times daily. 02/11/20   Love, Ivan Anchors, PA-C  atorvastatin (LIPITOR) 80 MG tablet Take 1 tablet (80 mg total) by mouth daily at 6 PM. 06/20/17   Santos-Sanchez, Merlene Morse, MD  calcitRIOL (ROCALTROL) 0.25 MCG capsule Take 0.25 mcg by mouth daily.    [provider]  carvedilol (COREG) 12.5 MG tablet Take 1 tablet (12.5 mg total) by mouth 2 (two) times daily with a meal. 01/29/20   Brimage, Vondra, DO  cholecalciferol (VITAMIN D) 25 MCG (1000 UNIT) tablet Take 1,000 Units by mouth daily.    [provider]  docusate sodium (COLACE) 50 MG capsule Take 100 mg by mouth daily as needed for mild constipation.    [provider]  doxazosin (CARDURA) 4 MG tablet Take 1 tablet (4 mg total) by mouth every 12 (twelve) hours. 01/29/20   Lyndee Hensen, DO  ferrous sulfate 325 (65 FE) MG tablet Take 325 mg by mouth daily.     [provider]  finasteride (PROSCAR) 5 MG tablet Take 5 mg by mouth daily.    [provider]  fluticasone (FLONASE) 50 MCG/ACT nasal spray Place 2 sprays into both nostrils daily as needed for allergies or rhinitis.    [provider]  hydrALAZINE (APRESOLINE) 25 MG tablet Take 3 tablets (75 mg total) by mouth every 8 (eight) hours. 02/11/20   Love, Ivan Anchors, PA-C  lactulose (CHRONULAC) 10  GM/15ML solution Take 10 g by mouth daily as needed for mild constipation or moderate constipation.    [provider]  omeprazole (PRILOSEC) 40 MG capsule Take 1 capsule (40 mg total) by mouth 2 (two) times daily before a meal. 01/21/20   Mansouraty, Telford Nab., MD  sennosides-docusate sodium (SENOKOT-S) 8.6-50 MG tablet Take 1 tablet by mouth daily after supper. 02/11/20   Love, Ivan Anchors, PA-C  Sodium Phosphates (RA SALINE ENEMA RE) Place 1 application rectally as needed (take every other day as needed for constipation).    [provider]  sucralfate (CARAFATE) 1 GM/10ML suspension Take 10 mLs (1 g total) by mouth 2 (two) times daily. Take once during the day and before bedtime.  Try to no take any other medications 1 hour before or after use of Carafate. 01/21/20 01/20/21  Mansouraty, Telford Nab., MD  vitamin B-12 (CYANOCOBALAMIN) 500 MCG tablet Take 1,000 mcg by mouth daily.  [provider]    Allergies    Lisinopril and Metformin and related  Review of Systems   Review of Systems  Constitutional: Positive for fatigue. Negative for chills, diaphoresis and fever.  HENT: Negative for congestion.   Eyes: Negative for visual disturbance.  Respiratory: Positive for shortness of breath (exertional). Negative for cough, chest tightness and wheezing.   Cardiovascular: Positive for leg swelling. Negative for chest pain and palpitations.  Gastrointestinal: Negative for abdominal pain, constipation, diarrhea, nausea and vomiting.  Genitourinary: Negative for dysuria, flank pain and frequency.  Musculoskeletal: Negative for back pain, neck pain and neck stiffness.  Skin: Negative for rash and wound.  Neurological: Positive for light-headedness. Negative for dizziness, syncope (near syncope yes) and headaches.  Psychiatric/Behavioral: Negative for agitation and confusion.  All other systems reviewed and are negative.   Physical Exam Updated Vital Signs BP (!) 168/73  (BP Location: Left Arm)   Pulse (!) 57   Temp 97.6 F (36.4 C) (Oral)   Resp 18   Ht 5\' 10"  (1.778 m)   Wt 105.2 kg   SpO2 100%   BMI 33.29 kg/m   Physical Exam Vitals and nursing note reviewed.  Constitutional:      General: He is not in acute distress.    Appearance: He is obese. He is not ill-appearing, toxic-appearing or diaphoretic.  HENT:     Head: Normocephalic and atraumatic.     Nose: Nose normal. No rhinorrhea.     Mouth/Throat:     Mouth: Mucous membranes are moist.     Pharynx: No oropharyngeal exudate or posterior oropharyngeal erythema.  Eyes:     Extraocular Movements: Extraocular movements intact.     Conjunctiva/sclera: Conjunctivae normal.     Pupils: Pupils are equal, round, and reactive to light.  Cardiovascular:     Rate and Rhythm: Regular rhythm. Bradycardia present.     Pulses: Normal pulses.     Heart sounds: No murmur heard.   Pulmonary:     Effort: Pulmonary effort is normal. No respiratory distress.     Breath sounds: Rales present. No wheezing or rhonchi.  Chest:     Chest wall: No tenderness.  Abdominal:     General: Abdomen is flat.     Tenderness: There is no abdominal tenderness. There is no right CVA tenderness, left CVA tenderness, guarding or rebound.  Musculoskeletal:        General: No tenderness. Normal range of motion.     Cervical back: Normal range of motion. No tenderness.     Right lower leg: No edema.     Left lower leg: Edema present.  Skin:    General: Skin is warm.     Coloration: Skin is not pale.     Findings: No erythema.  Neurological:     Mental Status: He is alert. Mental status is at baseline.     GCS: GCS eye subscore is 4. GCS verbal subscore is 5. GCS motor subscore is 6.     Cranial Nerves: No dysarthria or facial asymmetry.     Sensory: No sensory deficit.     Motor: Weakness present. No tremor.     Comments: Weakness in right arm and right leg compared to left.  Patient reports is at his baseline since  stroke  Psychiatric:        Mood and Affect: Mood normal.     ED Results / Procedures / Treatments   Labs (all labs ordered are listed, but only  abnormal results are displayed) Labs Reviewed  COMPREHENSIVE METABOLIC PANEL - Abnormal; Notable for the following components:      Result Value   Chloride 112 (*)    CO2 17 (*)    Glucose, Bld 166 (*)    BUN 49 (*)    Creatinine, Ser 3.43 (*)    Calcium 8.3 (*)    Total Protein 6.3 (*)    Albumin 3.1 (*)    GFR, Estimated 17 (*)    All other components within normal limits  CBC - Abnormal; Notable for the following components:   RBC 2.66 (*)    Hemoglobin 7.3 (*)    HCT 22.8 (*)    RDW 15.9 (*)    All other components within normal limits  PROTIME-INR - Abnormal; Notable for the following components:   Prothrombin Time 16.4 (*)    INR 1.4 (*)    All other components within normal limits  CBC - Abnormal; Notable for the following components:   RBC 2.81 (*)    Hemoglobin 7.4 (*)    HCT 24.9 (*)    MCHC 29.7 (*)    RDW 15.9 (*)    All other components within normal limits  BRAIN NATRIURETIC PEPTIDE - Abnormal; Notable for the following components:   B Natriuretic Peptide 603.4 (*)    All other components within normal limits  COMPREHENSIVE METABOLIC PANEL - Abnormal; Notable for the following components:   Chloride 115 (*)    CO2 17 (*)    Glucose, Bld 113 (*)    BUN 45 (*)    Creatinine, Ser 3.14 (*)    Calcium 8.5 (*)    Total Protein 6.2 (*)    Albumin 3.0 (*)    GFR, Estimated 19 (*)    All other components within normal limits  TROPONIN I (HIGH SENSITIVITY) - Abnormal; Notable for the following components:   Troponin I (High Sensitivity) 36 (*)    All other components within normal limits  POC OCCULT BLOOD, ED  TYPE AND SCREEN  TROPONIN I (HIGH SENSITIVITY)    EKG None  Radiology DG Chest 2 View  Result Date: 03/26/2020 CLINICAL DATA:  Syncope.  Anemia.  Shortness of breath. EXAM: CHEST - 2 VIEW  COMPARISON:  01/21/2020 FINDINGS: Enlarged cardiac silhouette consistent with cardiomegaly and/or pericardial fluid. Aortic atherosclerotic calcification. Pulmonary venous hypertension without frank edema. Small effusions in the posterior costophrenic angles. Resolution of left lung pneumonia previously seen. IMPRESSION: Enlarged cardiac silhouette consistent with cardiomegaly and/or pericardial fluid. Pulmonary venous hypertension. Small effusions in the posterior costophrenic angles. Findings consistent with fluid overload/congestive heart failure. Resolution of previous left lung pneumonia. Electronically Signed   By: Nelson Chimes M.D.   On: 03/26/2020 10:57   CT Head Wo Contrast  Result Date: 03/26/2020 CLINICAL DATA:  Head trauma, syncopal episode EXAM: CT HEAD WITHOUT CONTRAST TECHNIQUE: Contiguous axial images were obtained from the base of the skull through the vertex without intravenous contrast. COMPARISON:  01/21/2020 FINDINGS: Brain: No evidence of acute infarction, hemorrhage, extra-axial collection, ventriculomegaly, or mass effect. Generalized cerebral atrophy. Periventricular white matter low attenuation likely secondary to microangiopathy. Vascular: Cerebrovascular atherosclerotic calcifications are noted. Skull: Negative for fracture or focal lesion. Sinuses/Orbits: Visualized portions of the orbits are unremarkable. Visualized portions of the paranasal sinuses are unremarkable. Visualized portions of the mastoid air cells are unremarkable. Other: None. IMPRESSION: 1. No acute intracranial pathology. 2. Chronic microvascular ischemic changes and cerebral atrophy. Electronically Signed   By: Kathreen Devoid  On: 03/26/2020 12:01    Procedures Procedures (including critical care time)  Medications Ordered in ED Medications  furosemide (LASIX) tablet 40 mg (has no administration in time range)    ED Course  I have reviewed the triage vital signs and the nursing notes.  Pertinent labs  & imaging results that were available during my care of the patient were reviewed by me and considered in my medical decision making (see chart for details).    MDM Rules/Calculators/A&P                          Agam Davenport is a 79 y.o. male with a past medical history significant for prior stroke with right arm and right leg weakness at baseline, hypertension, hyperlipidemia, diabetes, atrial fibrillation status post ablation but on Eliquis therapy, CKD, and recurrent anemia who presents at the direction of his [place] team for low hemoglobin reading as well as for worsening generalized fatigue, exertional shortness of breath, near syncopal episode leading to a fall hitting his head yesterday. Of note, patient has been waiting in the emergency department approximately 15 hours prior to my initial evaluation. Patient says that he has been feeling bad for the last couple days worsening. He reports his legs are also getting more edematous and his arms are also starting to swell too. He says he is having exertional shortness of breath and fatigue and then had a lightheaded episode going up stairs yesterday leading him to miss his step and fall hitting his head into a door. He denies loss of consciousness and reports only mild discomfort in his head initially. He reports his head is doing better but he is on Eliquis. He had blood checked at his PCP and was told yesterday that his hemoglobin was in the 6 range. He was told to come to the emergency department for evaluation and potential blood transfusion.  On my initial evaluation, patient is still complaining of fatigue. He is not hypotensive or tachycardic initially. Patient reports that he has had episodes of lightheadedness and he thinks it contributed to his fall going up stairs yesterday. He is also exertional shortness of breath and peripheral edema. He does report he has been on and off of diuretics in the past but he is currently not on any. He reports he  has had heart problems in the past but is unsure if he has had a diagnosis of CHF before.  On exam, legs are extremely edematous and right hand is slightly more edematous than left. Patient has weakness in the right arm and right leg compared to left that is subtle. No numbness appreciated. No facial droop. Lungs have rales in the bases and chest is nontender. Abdomen is nontender. Patient is resting comfortably. He reports his stool is always dark due to his iron supplementation but will get a fecal occult test.  Patient had labs drawn when he first arrived yesterday which showed hemoglobin of 7.3. We will repeat it as he has been here for many hours to see if it is downtrending. Given his fall, near syncope, worsening peripheral edema, and exertional shortness of breath, I do suspect he is having component of symptomatic anemia. We will get a BNP, chest x-ray, troponin, and repeat some labs. We will get a fecal occult test.  Anticipate admission after work-up is completed.  1:51 PM Patient's repeat hemoglobin is actually somewhat improved from before he went from 7 3-7.4.  Fecal occult was negative  so he does not appear to be acutely bleeding.  Suspect this is his chronic anemia that is not as bad as it was several days ago and was checked.  His troponin initially is improved from prior at 36.  BNP is however elevated at 603 which is up from prior.  He also has edematous legs, arms, and x-ray of the chest showing some fluid in the lungs with congestive heart failure and fluid overload.  There are small effusions in the posterior costophrenic angles.  There is resolution of the previous pneumonia.  CT of the head showed no acute intracranial pathology.  Creatinine is similar to prior 3.43 and his potassium is normal.  No leukocytosis.  Had a shared decision-making conversation with patient.  Patient would like to try to go home if he can.  On initial evaluation, he is not hypoxic and is maintaining  oxygen saturations on room air.  We will try to ambulate him to see if he gets hypoxic.  If he gets lightheaded, short of breath, hypoxic, or feels worse, it will be reasonable to admit for safe diuresis and fluid removal in the setting of his chronic kidney disease, age, and anemia.  If he is able to maintain oxygen saturations and ambulates safely, he would like to try to go home and take oral Lasix and see his PCP in the next few days for reassessment.  Given his otherwise well appearance, we will attempt ambulation to see how he does.  He is also having a second troponin checked to make sure is not rising.  Patient waiting on second troponin, ambulation with pulse oximetry to make sure he does not get lightheaded, short of breath, or near syncopal, and an EKG.  If this is all reassuring, dissipate discharge with Lasix and follow-up with his PCP in the next few days.  Patient is agreeable to plan.  Care transferred to oncoming team awaiting for this plan to be enacted.  Final Clinical Impression(s) / ED Diagnoses Final diagnoses:  Near syncope  Fall, initial encounter  Hypervolemia, unspecified hypervolemia type  Peripheral edema  Exertional shortness of breath      Clinical Impression: 1. Near syncope   2. Fall, initial encounter   3. Hypervolemia, unspecified hypervolemia type   4. Peripheral edema   5. Exertional shortness of breath     Disposition: Awaiting on completion of work-up and reassessment to determine if patient is safe for discharge home for outpatient diuresis.  This note was prepared with assistance of Systems analyst. Occasional wrong-word or sound-a-like substitutions may have occurred due to the inherent limitations of voice recognition software.      Natalie Leclaire, Gwenyth Allegra, MD 03/26/20 [phone]

## 2020-03-26 NOTE — ED Notes (Signed)
Pt returned to room  

## 2020-03-26 NOTE — ED Provider Notes (Signed)
  Physical Exam  BP (!) 181/77 (BP Location: Left Arm)   Pulse 72   Temp 97.6 F (36.4 C) (Oral)   Resp 14   Ht 5\' 10"  (1.778 m)   Wt 105.2 kg   SpO2 99%   BMI 33.29 kg/m   Physical Exam  ED Course/Procedures     Procedures  MDM  Care assumed at 3 PM.  Patient is here with anemia and shortness of breath.  Patient appears to be in fluid overload.  Patient was given Lasix 40 mg p.o.  Signout pending delta troponin and ambulation  6:34 PM Patient ambulated and oxygen is 97-100%.  Patient's initial troponin was 36 and second troponin was 41.  His BNP is 600.  He was given Lasix and urinated about 600 cc Offer admission for observation but he prefers to go home.  Of note, his creatinine is 3.1 but that is baseline.  He is not on dialysis.  He is already on Lasix 40 mg daily.  We will add 40 mg daily for 3 days.  Told him that he will need recheck kidney function in a week.  He also will need repeat CBC in a week.  His CBC here showed hemoglobin 7.4 which is baseline.      Drenda Freeze, MD 03/26/20 [phone]

## 2020-03-26 NOTE — Discharge Instructions (Signed)
You likely have heart failure.  Please take additional Lasix 40 mg daily for 3 days.  You need to see your heart doctor for an echo.  You also need repeat kidney function and blood count in a week with your doctor.  Return to ER if you have worse shortness of breath, passing out, chest pain.

## 2020-03-26 NOTE — ED Notes (Signed)
Pt remained at 97-100% O2 when ambulated around the room. Pt has no complaints at this time.

## 2020-03-26 NOTE — Progress Notes (Signed)
A consult was placed to IV Therapy for a peripheral iv;  Pt's Right arm is restricted;  Attempted x 2 in the left arm with ultrasound;  Was not able to thread either catheter.

## 2020-04-16 ENCOUNTER — Encounter: Payer: Medicare PPO | Attending: Registered Nurse | Admitting: Physical Medicine and Rehabilitation

## 2020-04-16 DIAGNOSIS — I10 Essential (primary) hypertension: Secondary | ICD-10-CM | POA: Insufficient documentation

## 2020-08-03 DEATH — deceased

## 2021-01-20 IMAGING — CT CT HEAD W/O CM
4 series · 16 of 47 positions shown, 18 images · non-contrast
Comparison: 01/21/2020

CLINICAL DATA: Head trauma, syncopal episode

EXAM:
CT HEAD WITHOUT CONTRAST
TECHNIQUE: Contiguous axial images were obtained from the base of the skull
through the vertex without intravenous contrast.

[Series 3: head without · axial · non-contrast · 0.45mm/px · z∈[-92,+28]mm · 7 of 32 slices shown, 9 images]
[im 4/32  brain]
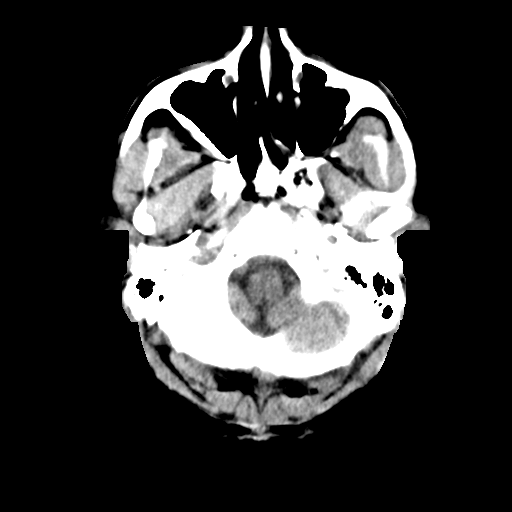
[im 4/32  bone]
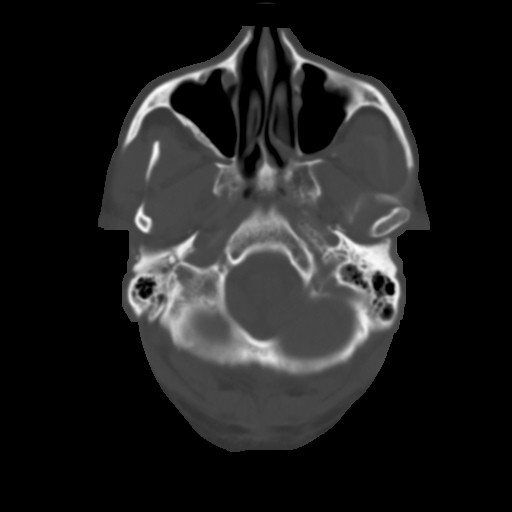
[im 8/32  brain]
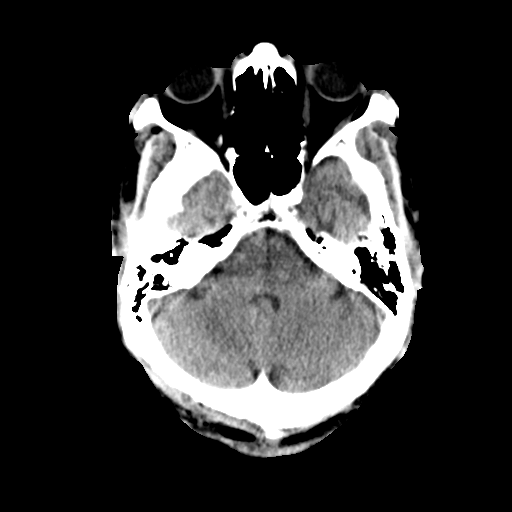
[im 12/32  brain]
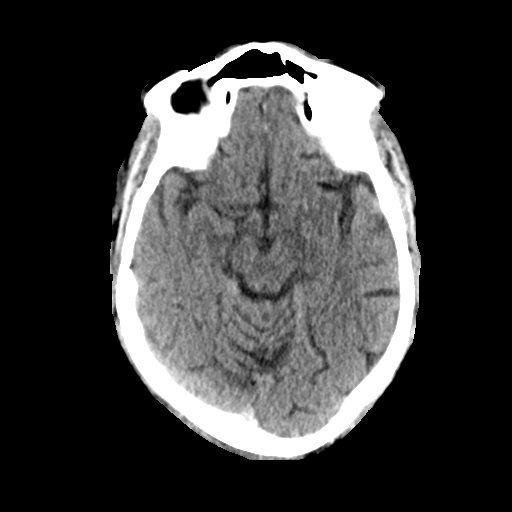
[im 16/32  brain]
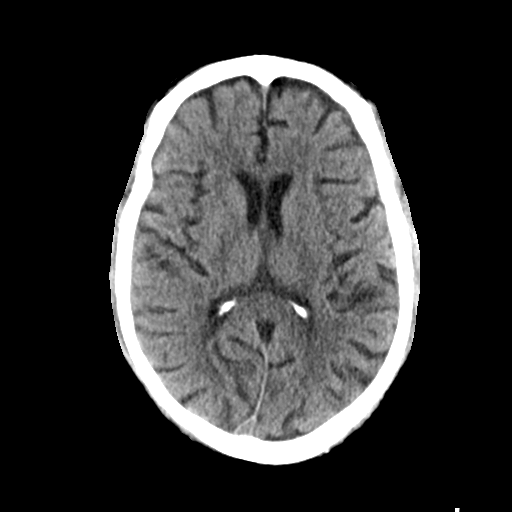
[im 20/32  brain]
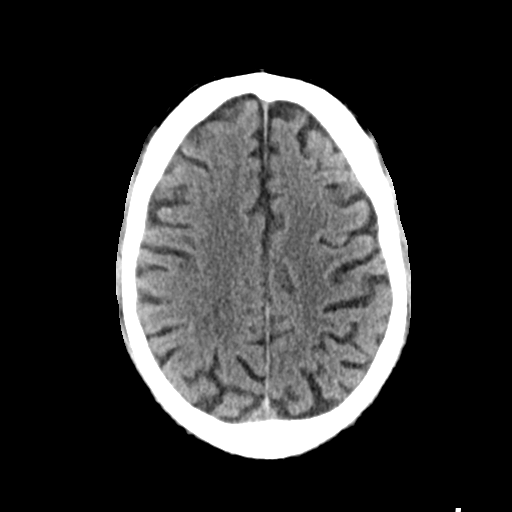
[im 20/32  bone]
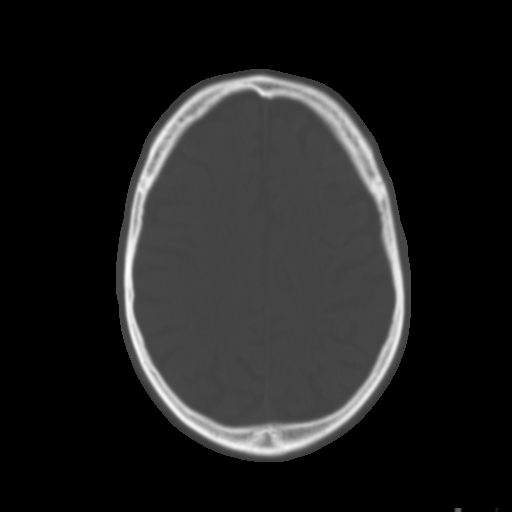
[im 24/32  brain]
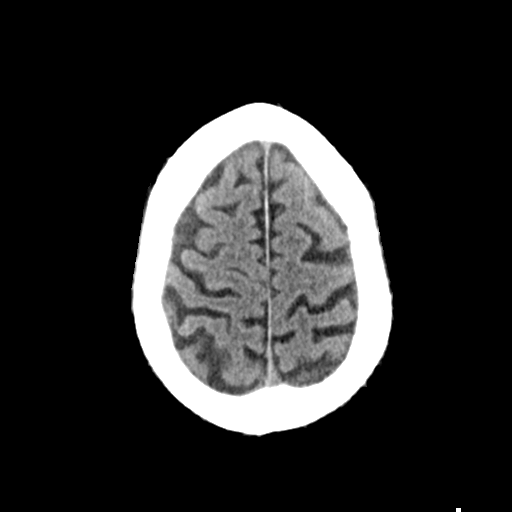
[im 28/32  brain]
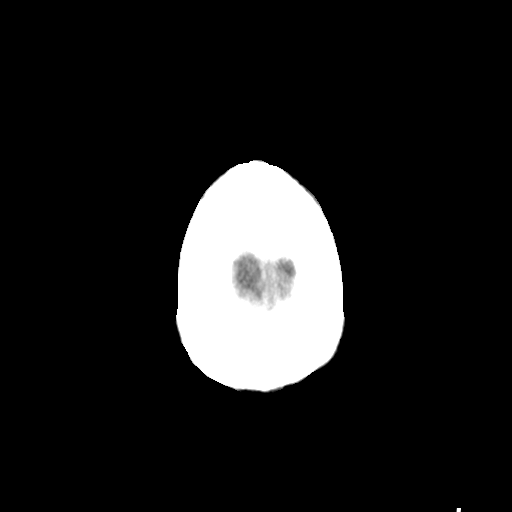

[Series 4: head bone · axial · 0.45mm/px · z∈[-93,-61]mm · 3 of 80 slices shown]
[im 8/80  bone]
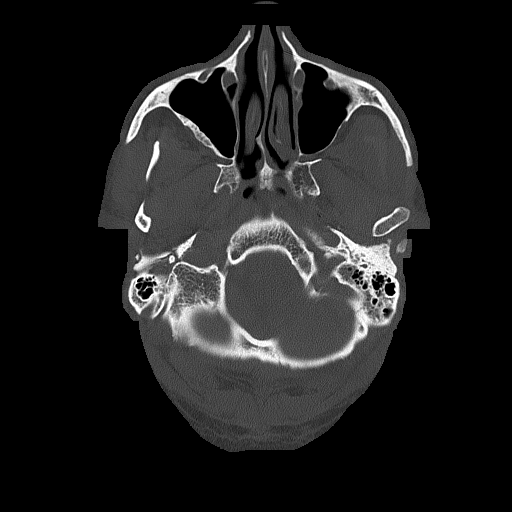
[im 16/80  bone]
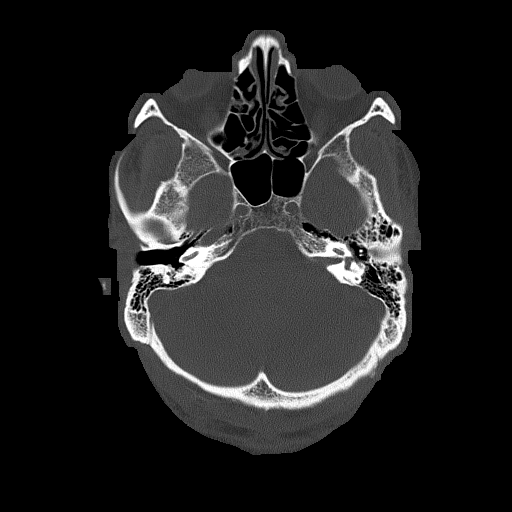
[im 24/80  bone]
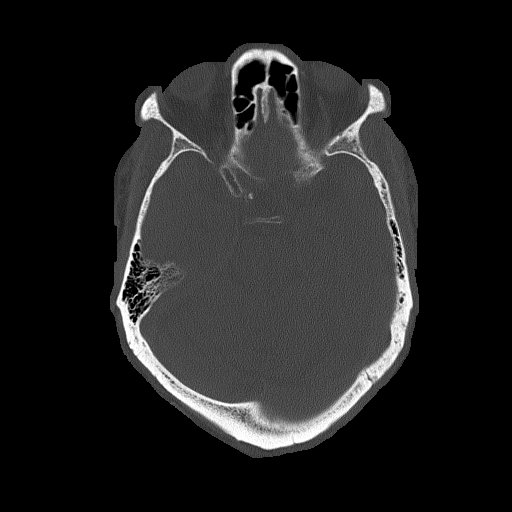

[Series 5: head without cor · coronal · non-contrast · 0.32mm/px · 3 of 73 slices shown]
[im 25/73  brain]
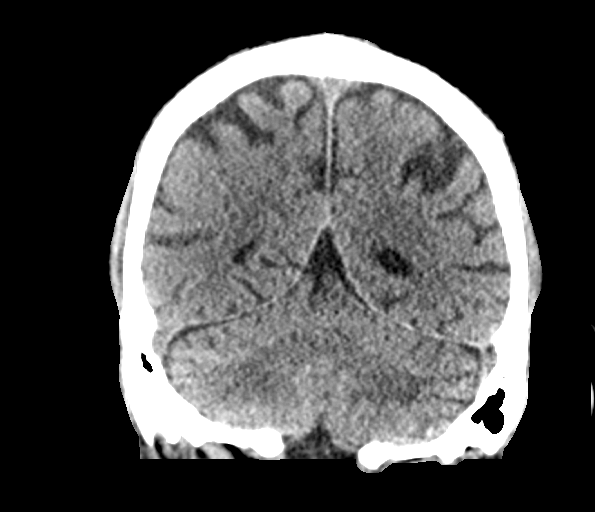
[im 33/73  brain]
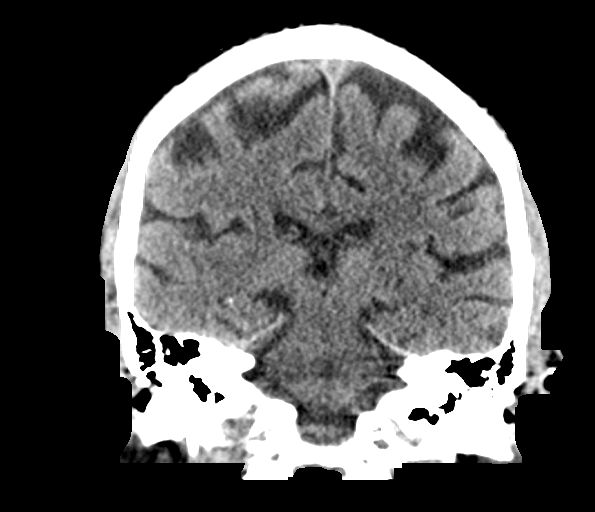
[im 41/73  brain]
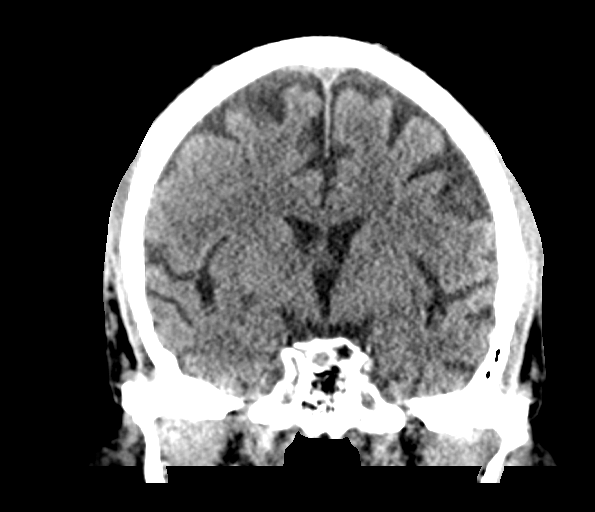

[Series 6: head without sag · sagittal · non-contrast · 0.34mm/px · 3 of 65 slices shown]
[im 22/65  brain]
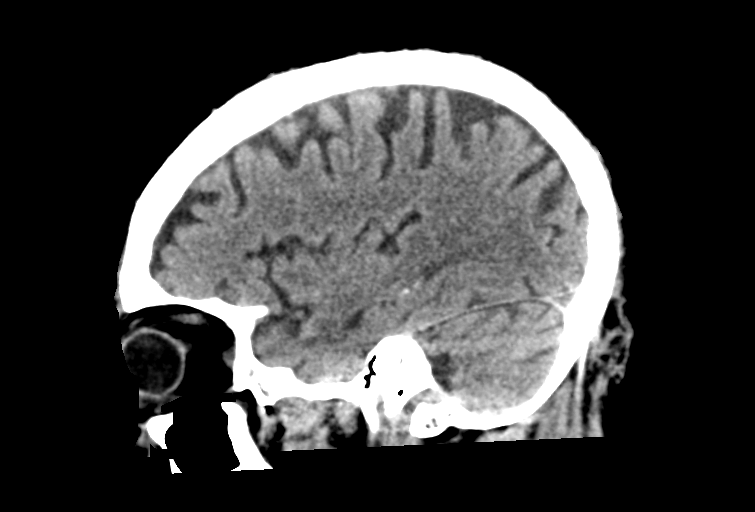
[im 33/65  brain]
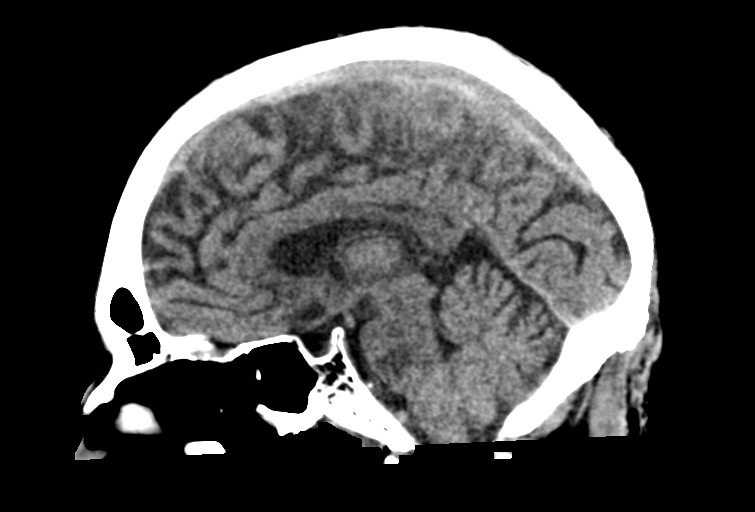
[im 43/65  brain]
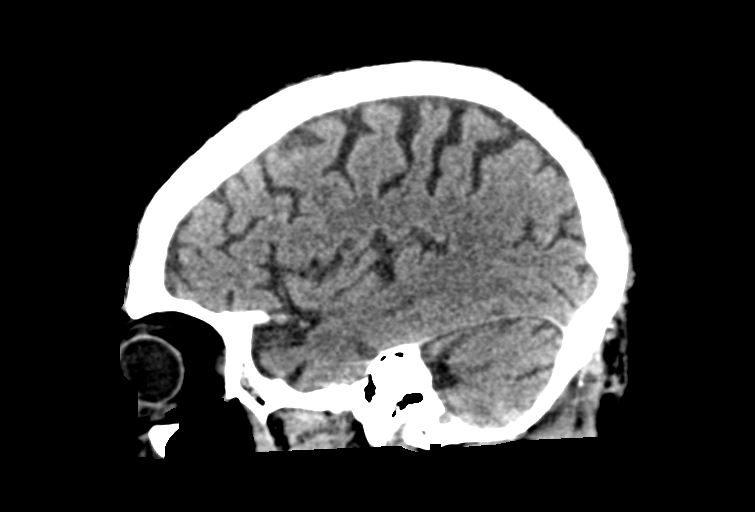

[16 of 47 positions shown; findings below may reference images not displayed]

FINDINGS: Brain: No evidence of acute infarction, hemorrhage, extra-axial
collection, ventriculomegaly, or mass effect. Generalized cerebral
atrophy. Periventricular white matter low attenuation likely
secondary to microangiopathy.

Vascular: Cerebrovascular atherosclerotic calcifications are noted.

Skull: Negative for fracture or focal lesion.

Sinuses/Orbits: Visualized portions of the orbits are unremarkable.
Visualized portions of the paranasal sinuses are unremarkable.
Visualized portions of the mastoid air cells are unremarkable.

Other: None.
IMPRESSION: 1. No acute intracranial pathology.
2. Chronic microvascular ischemic changes and cerebral atrophy.
